# Patient Record
Sex: Female | Born: 1970 | Race: White | Hispanic: No | Marital: Married | Smoking: Never smoker
Health system: Southern US, Community
[De-identification: ages and names within clinical notes are randomized; demographics above are authoritative.]

## PROBLEM LIST (undated history)

## (undated) ENCOUNTER — Emergency Department (HOSPITAL_COMMUNITY)

## (undated) DIAGNOSIS — K760 Fatty (change of) liver, not elsewhere classified: Secondary | ICD-10-CM

## (undated) DIAGNOSIS — R51 Headache: Secondary | ICD-10-CM

## (undated) DIAGNOSIS — K838 Other specified diseases of biliary tract: Secondary | ICD-10-CM

## (undated) DIAGNOSIS — R112 Nausea with vomiting, unspecified: Secondary | ICD-10-CM

## (undated) DIAGNOSIS — K9189 Other postprocedural complications and disorders of digestive system: Secondary | ICD-10-CM

## (undated) DIAGNOSIS — Z9889 Other specified postprocedural states: Secondary | ICD-10-CM

## (undated) DIAGNOSIS — T7840XA Allergy, unspecified, initial encounter: Secondary | ICD-10-CM

## (undated) DIAGNOSIS — A419 Sepsis, unspecified organism: Secondary | ICD-10-CM

## (undated) DIAGNOSIS — F419 Anxiety disorder, unspecified: Secondary | ICD-10-CM

## (undated) DIAGNOSIS — T148XXA Other injury of unspecified body region, initial encounter: Secondary | ICD-10-CM

## (undated) DIAGNOSIS — E119 Type 2 diabetes mellitus without complications: Secondary | ICD-10-CM

## (undated) HISTORY — DX: Fatty (change of) liver, not elsewhere classified: K76.0

## (undated) HISTORY — DX: Allergy, unspecified, initial encounter: T78.40XA

## (undated) HISTORY — DX: Anxiety disorder, unspecified: F41.9

## (undated) SURGERY — ERCP, WITH INTERVENTION IF INDICATED
Anesthesia: Monitor Anesthesia Care

---

## 2006-08-15 ENCOUNTER — Emergency Department (HOSPITAL_COMMUNITY): Admission: EM | Admit: 2006-08-15 | Discharge: 2006-08-15 | Payer: Self-pay | Admitting: Emergency Medicine

## 2007-02-27 ENCOUNTER — Emergency Department (HOSPITAL_COMMUNITY): Admission: EM | Admit: 2007-02-27 | Discharge: 2007-02-27 | Payer: Self-pay | Admitting: Emergency Medicine

## 2007-05-28 ENCOUNTER — Emergency Department (HOSPITAL_COMMUNITY): Admission: EM | Admit: 2007-05-28 | Discharge: 2007-05-28 | Payer: Self-pay | Admitting: Emergency Medicine

## 2008-02-07 ENCOUNTER — Emergency Department (HOSPITAL_COMMUNITY): Admission: EM | Admit: 2008-02-07 | Discharge: 2008-02-07 | Payer: Self-pay | Admitting: Emergency Medicine

## 2008-02-08 ENCOUNTER — Encounter: Admission: RE | Admit: 2008-02-08 | Discharge: 2008-02-08 | Payer: Self-pay | Admitting: Family Medicine

## 2008-02-21 ENCOUNTER — Encounter: Admission: RE | Admit: 2008-02-21 | Discharge: 2008-02-21 | Payer: Self-pay | Admitting: Family Medicine

## 2009-06-30 ENCOUNTER — Observation Stay (HOSPITAL_COMMUNITY): Admission: EM | Admit: 2009-06-30 | Discharge: 2009-07-01 | Payer: Self-pay | Admitting: Emergency Medicine

## 2009-08-01 HISTORY — PX: APPENDECTOMY: SHX54

## 2010-01-13 ENCOUNTER — Emergency Department (HOSPITAL_BASED_OUTPATIENT_CLINIC_OR_DEPARTMENT_OTHER): Admission: EM | Admit: 2010-01-13 | Discharge: 2010-01-14 | Payer: Self-pay | Admitting: Emergency Medicine

## 2010-01-14 ENCOUNTER — Ambulatory Visit: Payer: Self-pay | Admitting: Diagnostic Radiology

## 2010-08-12 ENCOUNTER — Ambulatory Visit: Admit: 2010-08-12 | Payer: Self-pay | Admitting: Family Medicine

## 2010-11-03 LAB — CBC
MCHC: 34.8 g/dL (ref 30.0–36.0)
MCV: 90.4 fL (ref 78.0–100.0)
Platelets: 294 10*3/uL (ref 150–400)
RDW: 14.1 % (ref 11.5–15.5)
WBC: 14.7 10*3/uL — ABNORMAL HIGH (ref 4.0–10.5)

## 2010-11-03 LAB — DIFFERENTIAL
Basophils Absolute: 0.1 10*3/uL (ref 0.0–0.1)
Eosinophils Relative: 2 % (ref 0–5)
Lymphs Abs: 1.8 10*3/uL (ref 0.7–4.0)
Monocytes Relative: 9 % (ref 3–12)
Neutro Abs: 11.4 10*3/uL — ABNORMAL HIGH (ref 1.7–7.7)
Neutrophils Relative %: 77 % (ref 43–77)

## 2010-11-03 LAB — COMPREHENSIVE METABOLIC PANEL
Albumin: 3.7 g/dL (ref 3.5–5.2)
Alkaline Phosphatase: 61 U/L (ref 39–117)
CO2: 25 mEq/L (ref 19–32)
Creatinine, Ser: 0.89 mg/dL (ref 0.4–1.2)
GFR calc Af Amer: >60 mL/min (ref >60)
Potassium: 3.8 mEq/L (ref 3.5–5.1)
Total Protein: 6.6 g/dL (ref 6.0–8.3)

## 2010-11-03 LAB — URINALYSIS, ROUTINE W REFLEX MICROSCOPIC
Bilirubin Urine: NEGATIVE
Nitrite: NEGATIVE
Specific Gravity, Urine: 1.02 (ref 1.005–1.030)

## 2010-11-03 LAB — POCT PREGNANCY, URINE: Preg Test, Ur: NEGATIVE

## 2011-02-17 ENCOUNTER — Encounter: Payer: Self-pay | Admitting: Family Medicine

## 2011-02-17 ENCOUNTER — Telehealth: Payer: Self-pay | Admitting: Family Medicine

## 2011-02-17 ENCOUNTER — Ambulatory Visit (INDEPENDENT_AMBULATORY_CARE_PROVIDER_SITE_OTHER): Payer: PRIVATE HEALTH INSURANCE | Admitting: Family Medicine

## 2011-02-17 ENCOUNTER — Ambulatory Visit: Payer: Self-pay | Admitting: Family Medicine

## 2011-02-17 VITALS — BP 120/80 | HR 80 | Wt 217.0 lb

## 2011-02-17 DIAGNOSIS — M940 Chondrocostal junction syndrome [Tietze]: Secondary | ICD-10-CM

## 2011-02-17 DIAGNOSIS — R109 Unspecified abdominal pain: Secondary | ICD-10-CM

## 2011-02-17 DIAGNOSIS — N920 Excessive and frequent menstruation with regular cycle: Secondary | ICD-10-CM

## 2011-02-17 DIAGNOSIS — M25569 Pain in unspecified knee: Secondary | ICD-10-CM

## 2011-02-17 DIAGNOSIS — R51 Headache: Secondary | ICD-10-CM

## 2011-02-17 DIAGNOSIS — M25562 Pain in left knee: Secondary | ICD-10-CM

## 2011-02-17 LAB — COMPREHENSIVE METABOLIC PANEL
Alkaline Phosphatase: 64 U/L (ref 39–117)
BUN: 10 mg/dL (ref 6–23)
Chloride: 104 mEq/L (ref 96–112)
Creat: 0.91 mg/dL (ref 0.50–1.10)
Sodium: 137 mEq/L (ref 135–145)
Total Bilirubin: 0.6 mg/dL (ref 0.3–1.2)
Total Protein: 7.6 g/dL (ref 6.0–8.3)

## 2011-02-17 LAB — CBC WITH DIFFERENTIAL/PLATELET
Basophils Relative: 0 % (ref 0–1)
Eosinophils Absolute: 0.2 10*3/uL (ref 0.0–0.7)
Lymphocytes Relative: 35 % (ref 12–46)
MCH: 30.3 pg (ref 26.0–34.0)
Monocytes Absolute: 0.7 10*3/uL (ref 0.1–1.0)
Neutro Abs: 3.7 10*3/uL (ref 1.7–7.7)
Neutrophils Relative %: 53 % (ref 43–77)
Platelets: 372 10*3/uL (ref 150–400)
RBC: 4.49 MIL/uL (ref 3.87–5.11)
WBC: 7 10*3/uL (ref 4.0–10.5)

## 2011-02-17 NOTE — Progress Notes (Signed)
  Subjective:    Patient ID: Karen Roberts, female    DOB: 1971-02-08, 40 y.o.   MRN: [id number]  HPI For the last 2 years she has been having left knee pain but no popping, effusion, locking, grinding or giving way. The pain occurs with any activity. She did have an injury to this proximally 4 months ago and did have x-rays which were apparently negative. 4 Will over a year she has had abdominal discomfort with eating or drinking anything including water. She will occasionally get nausea but no vomiting or diarrhea. She has not tried any OTC meds. Her weight has been increasing She also has a several month history of left chest aching. Motion makes it worse , breathing has no effect. She has a several month history of headaches occur twice per week they usually start in the frontal area radiating posteriorly. She has tried Advil and Aleve on occasion for this. No blurred vision, double vision, sneezing, itchy watery eye. The headache is usually worse as the day progresses.  Review of Systems     Objective:   Physical Exam alert and in no distress. Tympanic membranes and canals are normal. Throat is clear. Tonsils are normal. Neck is supple without adenopathy or thyromegaly. Cardiac exam shows a regular sinus rhythm without murmurs or gallops. Lungs are clear to auscultation. Abdominal exam shows normal bowel sounds with some midepigastric tenderness. She is also tender over the left third costochondral junction. Exam of the left knee shows tenderness to the lateral joint line with McMurray's testing causing pain. No effusion noted. Negative anterior drawer.        Assessment & Plan:  Chest wall pain. Abdominal pain. Knee pain. Headache. I will order an MRI. Also recommend Tylenol for the other aches and pains. Sample of Dexilant given. She is to return here in one week for recheck and keep track of some of her symptoms. Routine blood screening.

## 2011-02-17 NOTE — Patient Instructions (Signed)
Keep track of your headaches in terms of when where and why. Also be aware if there any foods that tend to make this worse. Use 2 Tylenol 4 times per day regularly. I will see you again in one week

## 2011-02-17 NOTE — Telephone Encounter (Signed)
ERROR-LM

## 2011-02-19 ENCOUNTER — Ambulatory Visit
Admission: RE | Admit: 2011-02-19 | Discharge: 2011-02-19 | Disposition: A | Discharge status: Home / Self Care | Payer: PRIVATE HEALTH INSURANCE | Source: Ambulatory Visit | Attending: Family Medicine | Admitting: Family Medicine

## 2011-02-19 DIAGNOSIS — M25562 Pain in left knee: Secondary | ICD-10-CM

## 2011-02-21 ENCOUNTER — Telehealth: Payer: Self-pay

## 2011-02-21 NOTE — Telephone Encounter (Signed)
Pt advised blood work looked good and she has an appt Thursday to discuss mri

## 2011-02-24 ENCOUNTER — Encounter: Payer: Self-pay | Admitting: Family Medicine

## 2011-02-24 ENCOUNTER — Ambulatory Visit (INDEPENDENT_AMBULATORY_CARE_PROVIDER_SITE_OTHER): Payer: PRIVATE HEALTH INSURANCE | Admitting: Family Medicine

## 2011-02-24 VITALS — BP 110/78 | HR 83 | Wt 218.0 lb

## 2011-02-24 DIAGNOSIS — N644 Mastodynia: Secondary | ICD-10-CM

## 2011-02-24 DIAGNOSIS — R109 Unspecified abdominal pain: Secondary | ICD-10-CM

## 2011-02-24 DIAGNOSIS — M25569 Pain in unspecified knee: Secondary | ICD-10-CM

## 2011-02-24 DIAGNOSIS — G44209 Tension-type headache, unspecified, not intractable: Secondary | ICD-10-CM

## 2011-02-24 DIAGNOSIS — M25562 Pain in left knee: Secondary | ICD-10-CM

## 2011-02-24 DIAGNOSIS — K760 Fatty (change of) liver, not elsewhere classified: Secondary | ICD-10-CM

## 2011-02-24 HISTORY — DX: Fatty (change of) liver, not elsewhere classified: K76.0

## 2011-02-24 NOTE — Patient Instructions (Signed)
If this doesn't take her pain away, only for referral to orthopedics

## 2011-02-24 NOTE — Progress Notes (Signed)
  Subjective:    Patient ID: Karen Roberts, female    DOB: 12-Apr-1971, 40 y.o.   MRN: [id number]  HPI She is here for evaluation of multiple concerns. She had a recent MRI which did show some minor changes including some meniscal damage. She continues to complain of headaches that do tend to get worse when she is stressed and also if she is exposed to excessive heat. She did stop ibuprofen and is getting some relief with Tylenol. She continues to have difficulty with abdominal bloating from eating or drinking anything. She now states he has a previous history of difficulty with gallbladder disease and was told to have surgery however she did not have it done. She continues to complain of left-sided breast pain for the last several months. She has a previous history of mastitis. She's had no discharge and cannot relate this to her menses.   Review of Systems Negative except as above    Objective:   Physical Exam Breast exam deferred. Alert and in no distress.       Assessment & Plan:  Probable tension headache. Meniscal damage. Abdominal bloating probable gallbladder disease. Left breast pain. I discussed tension headache care with her. Strongly encouraged her to look at what is stressing her and address it differently. She will keep in touch with me on this. Set up for abdominal ultrasound. Mammogram ordered. Discussed treatment of her knee pain including surgical versus conservative care with injection. We have decided to inject this area. 40 mg of Kenalog and 3 cc of Xylocaine was injected into the joint space without difficulty. If she continues to have difficulty, orthopedic referral will be made.

## 2011-02-25 ENCOUNTER — Ambulatory Visit
Admission: RE | Admit: 2011-02-25 | Discharge: 2011-02-25 | Disposition: A | Discharge status: Home / Self Care | Payer: PRIVATE HEALTH INSURANCE | Source: Ambulatory Visit | Attending: Family Medicine | Admitting: Family Medicine

## 2011-02-25 DIAGNOSIS — R109 Unspecified abdominal pain: Secondary | ICD-10-CM

## 2011-02-25 NOTE — Progress Notes (Signed)
Pt appt for Aug. 7th @ 2:30 at [facility]

## 2011-03-08 ENCOUNTER — Encounter (INDEPENDENT_AMBULATORY_CARE_PROVIDER_SITE_OTHER): Payer: Self-pay | Admitting: Surgery

## 2011-03-08 ENCOUNTER — Ambulatory Visit (INDEPENDENT_AMBULATORY_CARE_PROVIDER_SITE_OTHER): Payer: PRIVATE HEALTH INSURANCE | Admitting: Surgery

## 2011-03-10 ENCOUNTER — Ambulatory Visit (INDEPENDENT_AMBULATORY_CARE_PROVIDER_SITE_OTHER): Payer: PRIVATE HEALTH INSURANCE | Admitting: Surgery

## 2011-03-10 ENCOUNTER — Encounter (INDEPENDENT_AMBULATORY_CARE_PROVIDER_SITE_OTHER): Payer: Self-pay | Admitting: Surgery

## 2011-03-10 VITALS — BP 110/72 | HR 78 | Ht 66.0 in | Wt 217.0 lb

## 2011-03-10 DIAGNOSIS — K8010 Calculus of gallbladder with chronic cholecystitis without obstruction: Secondary | ICD-10-CM

## 2011-03-10 NOTE — Progress Notes (Signed)
Karen Roberts is a 40 y.o. female.    Chief Complaint  Patient presents with  . Other    Eval for gallstones    HPI HPI This patient is referred for evaluation of abdominal bloating and gallstones. The patient states that for several years she has been unable to be certain types of meat because of abdominal pain and nausea. Over the last several months her symptoms have worsened to the point where every time she has something to eat or drink her upper abdomen becomes quite distended and bloated. She was evaluated by Dr. Susann Givens who performed lab work and an ultrasound. She has a normal CBC and liver function tests. Her ultrasound showed multiple gallstones and a positive sonographic Murphy sign. There is no sign of common bile duct dilatation. She comes in today for surgical evaluation  Past Medical History  Diagnosis Date  . Allergy   . Anxiety     Past Surgical History  Procedure Date  . Appendectomy   . Cesarean section 2000    History reviewed. No pertinent family history.  Social History History  Substance Use Topics  . Smoking status: Never Smoker   . Smokeless tobacco: Never Used  . Alcohol Use: Yes    Allergies  Allergen Reactions  . Ciprocin-Fluocin-Procin (Fluocinolone Acetonide)     No current outpatient prescriptions on file.    Review of Systems ROS Review of systems positive for significant weight gain over 50 pounds in the last year. She also has left knee pain from a torn ligament. Otherwise her review of systems is negative. Physical Exam Physical Exam   Blood pressure 110/72, pulse 78, height 5\' 6"  (1.676 m), weight 217 lb (98.431 kg), last menstrual period 02/10/2011. WDWN in NAD HEENT:  EOMI, sclera anicteric Neck:  No masses, no thyromegaly Lungs:  CTA bilaterally; normal respiratory effort CV:  Regular rate and rhythm; no murmurs Abd:  +bowel sounds, soft, moderately tender in the RUQ/ epigastrium, no masses Ext:  Well-perfused; no  edema Skin:  Warm, dry; no sign of jaundice  Assessment/Plan 1.  Chronic calculus cholecystitis  Plan:  Laparoscopic cholecystectomy with intraoperative cholangiogram.  I discussed the procedure in detail.  The patient was given Agricultural engineer.  We discussed the risks and benefits of a laparoscopic cholecystectomy including, but not limited to bleeding, infection, injury to surrounding structures such as the intestine or liver, bile leak, retained gallstones, need to convert to an open procedure, prolonged diarrhea, blood clots such as  DVT, common bile duct injury, anesthesia risks, and possible need for additional procedures.  We discussed the typical post-operative recovery course.   Nakhi Choi K. 03/10/2011, 10:01 AM

## 2011-03-10 NOTE — Patient Instructions (Signed)
Schedule laparoscopic cholecystectomy today

## 2011-04-28 LAB — POCT URINALYSIS DIP (DEVICE)
Bilirubin Urine: NEGATIVE
Glucose, UA: NEGATIVE
Ketones, ur: NEGATIVE
Specific Gravity, Urine: 1.02
Urobilinogen, UA: 0.2
pH: 5

## 2011-04-28 LAB — POCT PREGNANCY, URINE
Operator id: 239701
Preg Test, Ur: NEGATIVE

## 2011-05-16 LAB — URINALYSIS, ROUTINE W REFLEX MICROSCOPIC
Bilirubin Urine: NEGATIVE
Ketones, ur: NEGATIVE
Leukocytes, UA: NEGATIVE
Nitrite: NEGATIVE
Protein, ur: NEGATIVE
Urobilinogen, UA: 0.2
pH: 8.5 — ABNORMAL HIGH

## 2011-05-16 LAB — POCT PREGNANCY, URINE: Preg Test, Ur: NEGATIVE

## 2011-05-16 LAB — URINE MICROSCOPIC-ADD ON

## 2011-05-16 LAB — RAPID STREP SCREEN (MED CTR MEBANE ONLY): Streptococcus, Group A Screen (Direct): NEGATIVE

## 2011-09-01 ENCOUNTER — Ambulatory Visit (INDEPENDENT_AMBULATORY_CARE_PROVIDER_SITE_OTHER): Payer: Self-pay | Admitting: Surgery

## 2011-09-01 ENCOUNTER — Encounter (INDEPENDENT_AMBULATORY_CARE_PROVIDER_SITE_OTHER): Payer: Self-pay | Admitting: Surgery

## 2011-09-01 VITALS — BP 122/78 | HR 74 | Temp 98.8°F | Ht 67.0 in | Wt 212.8 lb

## 2011-09-01 DIAGNOSIS — K8010 Calculus of gallbladder with chronic cholecystitis without obstruction: Secondary | ICD-10-CM

## 2011-09-01 NOTE — Progress Notes (Signed)
Karen Roberts is a 41 y.o. female.    Chief Complaint  Patient presents with  . Other    Eval for gallstones    HPI HPI This patient is referred for evaluation of abdominal bloating and gallstones. The patient states that for several years she has been unable to be certain types of meat because of abdominal pain and nausea. Over the last several months her symptoms have worsened to the point where every time she has something to eat or drink her upper abdomen becomes quite distended and bloated. She was evaluated by Dr. Susann Givens who performed lab work and an ultrasound. She has a normal CBC and liver function tests. Her ultrasound showed multiple gallstones and a positive sonographic Murphy sign. There is no sign of common bile duct dilatation. I initially evaluated her last August.  She was unable to schedule surgery, but comes in today to plan her surgery.  Her symptoms persist - mostly nausea and bloating.  Past Medical History  Diagnosis Date  . Allergy   . Anxiety     Past Surgical History  Procedure Date  . Appendectomy   . Cesarean section 2000    History reviewed. No pertinent family history.  Social History History  Substance Use Topics  . Smoking status: Never Smoker   . Smokeless tobacco: Never Used  . Alcohol Use: Yes    Allergies  Allergen Reactions  . Ciprocin-Fluocin-Procin (Fluocinolone Acetonide)     No current outpatient prescriptions on file.    Review of Systems ROS Review of systems positive for significant weight gain over 50 pounds in the last year. She also has left knee pain from a torn ligament. Otherwise her review of systems is negative. Physical Exam Physical Exam   Filed Vitals:   09/01/11 0937  BP: 122/78  Pulse: 74  Temp: 98.8 F (37.1 C)   . WDWN in NAD HEENT:  EOMI, sclera anicteric Neck:  No masses, no thyromegaly Lungs:  CTA bilaterally; normal respiratory effort CV:  Regular rate and rhythm; no murmurs Abd:  +bowel  sounds, soft, moderately tender in the RUQ/ epigastrium, no masses Ext:  Well-perfused; no edema Skin:  Warm, dry; no sign of jaundice  Assessment/Plan 1.  Chronic calculus cholecystitis  Plan:  Laparoscopic cholecystectomy with intraoperative cholangiogram.  I discussed the procedure in detail.  The patient was given Agricultural engineer.  We discussed the risks and benefits of a laparoscopic cholecystectomy including, but not limited to bleeding, infection, injury to surrounding structures such as the intestine or liver, bile leak, retained gallstones, need to convert to an open procedure, prolonged diarrhea, blood clots such as  DVT, common bile duct injury, anesthesia risks, and possible need for additional procedures.  We discussed the typical post-operative recovery course.   Wilmon Arms. Corliss Skains, MD, [facility]  09/01/2011 10:16 AM

## 2011-09-28 ENCOUNTER — Telehealth: Payer: Self-pay | Admitting: Internal Medicine

## 2011-09-28 NOTE — Telephone Encounter (Signed)
Have her set up an appointment 

## 2011-09-29 NOTE — Telephone Encounter (Signed)
Left message for pt to make appt to discus this issue

## 2011-10-06 ENCOUNTER — Institutional Professional Consult (permissible substitution): Payer: Self-pay | Admitting: Family Medicine

## 2011-11-25 ENCOUNTER — Encounter (HOSPITAL_COMMUNITY): Payer: Self-pay | Admitting: Respiratory Therapy

## 2011-11-28 ENCOUNTER — Encounter (HOSPITAL_COMMUNITY): Payer: Self-pay

## 2011-11-28 ENCOUNTER — Encounter (HOSPITAL_COMMUNITY)
Admission: RE | Admit: 2011-11-28 | Discharge: 2011-11-28 | Disposition: A | Discharge status: Home / Self Care | Payer: 59 | Source: Ambulatory Visit | Attending: Surgery | Admitting: Surgery

## 2011-11-28 HISTORY — DX: Other injury of unspecified body region, initial encounter: T14.8XXA

## 2011-11-28 HISTORY — DX: Other specified postprocedural states: R11.2

## 2011-11-28 HISTORY — DX: Nausea with vomiting, unspecified: Z98.890

## 2011-11-28 LAB — SURGICAL PCR SCREEN: MRSA, PCR: NEGATIVE

## 2011-11-28 LAB — CBC
MCV: 89.8 fL (ref 78.0–100.0)
Platelets: 355 10*3/uL (ref 150–400)
RBC: 4.49 MIL/uL (ref 3.87–5.11)
WBC: 8.1 10*3/uL (ref 4.0–10.5)

## 2011-11-28 MED ORDER — CEFAZOLIN SODIUM-DEXTROSE 2-3 GM-% IV SOLR
2.0000 g | INTRAVENOUS | Status: AC
  Administered 2011-11-29: 2 g via INTRAVENOUS
  Filled 2011-11-28: qty 50

## 2011-11-28 NOTE — Pre-Procedure Instructions (Addendum)
20 Karen Roberts  11/28/2011   Your procedure is scheduled on:   Tuesday, April 30th.  Report to [facility] at 7:30 AM.  Call this number if you have problems the morning of surgery: [phone]   Remember:   Do not eat food:After Midnight.  May have clear liquids: up to 4 Hours before arrival. (3:30am)  Clear liquids include soda, tea, black coffee, apple or grape juice, broth.  Take these medicines the morning of surgery with A SIP OF WATER: None     Discontinue any Aspirin Products, NSAIDS and Herbal medications.   Do not wear jewelry, make-up or nail polish.  Do not wear lotions, powders, or perfumes. You may wear deodorant.  Do not shave 48 hours prior to surgery.  Do not bring valuables to the hospital.  Contacts, dentures or bridgework may not be worn into surgery.  Leave suitcase in the car. After surgery it may be brought to your room.  For patients admitted to the hospital, checkout time is 11:00 AM the day of discharge.   Patients discharged the day of surgery will not be allowed to drive home.  Name and phone number of your driver: --  Special Instructions: CHG Shower Use Special Wash: 1/2 bottle night before surgery and 1/2 bottle morning of surgery.   Please read over the following fact sheets that you were given: Pain Booklet, Coughing and Deep Breathing, MRSA Information and Surgical Site Infection Prevention

## 2011-11-29 ENCOUNTER — Encounter (HOSPITAL_COMMUNITY)
Admission: RE | Disposition: A | Discharge status: Home / Self Care | Payer: Self-pay | Source: Ambulatory Visit | Attending: Surgery

## 2011-11-29 ENCOUNTER — Ambulatory Visit (HOSPITAL_COMMUNITY): Payer: 59 | Admitting: Anesthesiology

## 2011-11-29 ENCOUNTER — Encounter (HOSPITAL_COMMUNITY): Payer: Self-pay | Admitting: General Practice

## 2011-11-29 ENCOUNTER — Encounter (HOSPITAL_COMMUNITY): Payer: Self-pay

## 2011-11-29 ENCOUNTER — Ambulatory Visit (HOSPITAL_COMMUNITY)
Admission: RE | Admit: 2011-11-29 | Discharge: 2011-12-01 | Disposition: A | Discharge status: Home / Self Care | Payer: 59 | Source: Ambulatory Visit | Attending: Surgery | Admitting: Surgery

## 2011-11-29 ENCOUNTER — Ambulatory Visit (HOSPITAL_COMMUNITY): Payer: 59

## 2011-11-29 ENCOUNTER — Encounter (HOSPITAL_COMMUNITY): Payer: Self-pay | Admitting: Anesthesiology

## 2011-11-29 DIAGNOSIS — Z01812 Encounter for preprocedural laboratory examination: Secondary | ICD-10-CM | POA: Insufficient documentation

## 2011-11-29 DIAGNOSIS — G8918 Other acute postprocedural pain: Secondary | ICD-10-CM | POA: Insufficient documentation

## 2011-11-29 DIAGNOSIS — Y9210 Unspecified residential institution as the place of occurrence of the external cause: Secondary | ICD-10-CM | POA: Insufficient documentation

## 2011-11-29 DIAGNOSIS — Z01811 Encounter for preprocedural respiratory examination: Secondary | ICD-10-CM | POA: Insufficient documentation

## 2011-11-29 DIAGNOSIS — K8010 Calculus of gallbladder with chronic cholecystitis without obstruction: Secondary | ICD-10-CM

## 2011-11-29 DIAGNOSIS — Y836 Removal of other organ (partial) (total) as the cause of abnormal reaction of the patient, or of later complication, without mention of misadventure at the time of the procedure: Secondary | ICD-10-CM | POA: Insufficient documentation

## 2011-11-29 HISTORY — PX: CHOLECYSTECTOMY: SHX55

## 2011-11-29 HISTORY — DX: Headache: R51

## 2011-11-29 SURGERY — LAPAROSCOPIC CHOLECYSTECTOMY WITH INTRAOPERATIVE CHOLANGIOGRAM
Anesthesia: General | Site: Abdomen | Wound class: Contaminated

## 2011-11-29 MED ORDER — 0.9 % SODIUM CHLORIDE (POUR BTL) OPTIME
TOPICAL | Status: DC | PRN
  Administered 2011-11-29: 1000 mL

## 2011-11-29 MED ORDER — PROMETHAZINE HCL 25 MG/ML IJ SOLN
INTRAMUSCULAR | Status: AC
  Filled 2011-11-29: qty 1

## 2011-11-29 MED ORDER — MORPHINE SULFATE 2 MG/ML IJ SOLN
INTRAMUSCULAR | Status: AC
  Administered 2011-11-29: 2 mg via INTRAVENOUS
  Filled 2011-11-29: qty 1

## 2011-11-29 MED ORDER — OXYCODONE-ACETAMINOPHEN 5-325 MG PO TABS: 1.0000 | ORAL_TABLET | ORAL | Status: DC | PRN

## 2011-11-29 MED ORDER — SODIUM CHLORIDE 0.9 % IV SOLN
INTRAVENOUS | Status: DC | PRN
  Administered 2011-11-29: 10:00:00

## 2011-11-29 MED ORDER — KETOROLAC TROMETHAMINE 30 MG/ML IJ SOLN
30.0000 mg | Freq: Once | INTRAMUSCULAR | Status: AC
  Administered 2011-11-29: 30 mg via INTRAVENOUS

## 2011-11-29 MED ORDER — HYDROMORPHONE HCL PF 1 MG/ML IJ SOLN
INTRAMUSCULAR | Status: AC
  Filled 2011-11-29: qty 1

## 2011-11-29 MED ORDER — EPHEDRINE SULFATE 50 MG/ML IJ SOLN
INTRAMUSCULAR | Status: DC | PRN
  Administered 2011-11-29: 10 mg via INTRAVENOUS

## 2011-11-29 MED ORDER — KETOROLAC TROMETHAMINE 30 MG/ML IJ SOLN
INTRAMUSCULAR | Status: AC
  Filled 2011-11-29: qty 1

## 2011-11-29 MED ORDER — HYDROMORPHONE HCL PF 1 MG/ML IJ SOLN: 1.0000 mg | INTRAMUSCULAR | Status: DC | PRN

## 2011-11-29 MED ORDER — ONDANSETRON HCL 4 MG/2ML IJ SOLN
INTRAMUSCULAR | Status: AC
  Filled 2011-11-29: qty 2

## 2011-11-29 MED ORDER — KETOROLAC TROMETHAMINE 30 MG/ML IJ SOLN
30.0000 mg | Freq: Four times a day (QID) | INTRAMUSCULAR | Status: DC
  Administered 2011-11-29 – 2011-12-01 (×7): 30 mg via INTRAVENOUS
  Filled 2011-11-29 (×9): qty 1

## 2011-11-29 MED ORDER — LACTATED RINGERS IV SOLN: INTRAVENOUS | Status: DC

## 2011-11-29 MED ORDER — PROPOFOL 10 MG/ML IV EMUL
INTRAVENOUS | Status: DC | PRN
  Administered 2011-11-29: 200 mg via INTRAVENOUS

## 2011-11-29 MED ORDER — ONDANSETRON HCL 4 MG/2ML IJ SOLN
INTRAMUSCULAR | Status: DC | PRN
  Administered 2011-11-29: 4 mg via INTRAVENOUS

## 2011-11-29 MED ORDER — NEOSTIGMINE METHYLSULFATE 1 MG/ML IJ SOLN
INTRAMUSCULAR | Status: DC | PRN
  Administered 2011-11-29: 3 mg via INTRAVENOUS

## 2011-11-29 MED ORDER — ONDANSETRON HCL 4 MG PO TABS: 4.0000 mg | ORAL_TABLET | Freq: Four times a day (QID) | ORAL | Status: DC | PRN

## 2011-11-29 MED ORDER — ONDANSETRON HCL 4 MG/2ML IJ SOLN: 4.0000 mg | INTRAMUSCULAR | Status: DC | PRN

## 2011-11-29 MED ORDER — LACTATED RINGERS IV SOLN
INTRAVENOUS | Status: DC | PRN
  Administered 2011-11-29 (×2): via INTRAVENOUS

## 2011-11-29 MED ORDER — SODIUM CHLORIDE 0.9 % IV SOLN: INTRAVENOUS | Status: DC

## 2011-11-29 MED ORDER — GLYCOPYRROLATE 0.2 MG/ML IJ SOLN
INTRAMUSCULAR | Status: DC | PRN
  Administered 2011-11-29: 0.4 mg via INTRAVENOUS

## 2011-11-29 MED ORDER — MORPHINE SULFATE 2 MG/ML IJ SOLN
INTRAMUSCULAR | Status: AC
  Filled 2011-11-29: qty 1

## 2011-11-29 MED ORDER — ONDANSETRON HCL 4 MG/2ML IJ SOLN
4.0000 mg | Freq: Four times a day (QID) | INTRAMUSCULAR | Status: DC | PRN
  Administered 2011-12-01: 4 mg via INTRAVENOUS
  Filled 2011-11-29: qty 2

## 2011-11-29 MED ORDER — SODIUM CHLORIDE 0.9 % IR SOLN
Status: DC | PRN
  Administered 2011-11-29: 1000 mL

## 2011-11-29 MED ORDER — MORPHINE SULFATE 2 MG/ML IJ SOLN
2.0000 mg | INTRAMUSCULAR | Status: DC | PRN
  Administered 2011-11-29 – 2011-11-30 (×4): 2 mg via INTRAVENOUS
  Filled 2011-11-29 (×3): qty 1

## 2011-11-29 MED ORDER — PHENYLEPHRINE HCL 10 MG/ML IJ SOLN
INTRAMUSCULAR | Status: DC | PRN
  Administered 2011-11-29: 160 ug via INTRAVENOUS
  Administered 2011-11-29 (×2): 120 ug via INTRAVENOUS

## 2011-11-29 MED ORDER — HYDROMORPHONE HCL PF 1 MG/ML IJ SOLN
0.2500 mg | INTRAMUSCULAR | Status: DC | PRN
  Administered 2011-11-29 (×6): 0.5 mg via INTRAVENOUS

## 2011-11-29 MED ORDER — DEXAMETHASONE SODIUM PHOSPHATE 10 MG/ML IJ SOLN
INTRAMUSCULAR | Status: DC | PRN
  Administered 2011-11-29: 8 mg via INTRAVENOUS

## 2011-11-29 MED ORDER — PROMETHAZINE HCL 25 MG/ML IJ SOLN
6.2500 mg | INTRAMUSCULAR | Status: DC | PRN
  Administered 2011-11-29: 6.25 mg via INTRAVENOUS

## 2011-11-29 MED ORDER — SODIUM CHLORIDE 0.9 % IV SOLN
INTRAVENOUS | Status: DC
  Administered 2011-11-29 – 2011-11-30 (×3): via INTRAVENOUS

## 2011-11-29 MED ORDER — IBUPROFEN 600 MG PO TABS
600.0000 mg | ORAL_TABLET | Freq: Four times a day (QID) | ORAL | Status: DC | PRN
  Filled 2011-11-29: qty 1

## 2011-11-29 MED ORDER — FENTANYL CITRATE 0.05 MG/ML IJ SOLN
INTRAMUSCULAR | Status: DC | PRN
  Administered 2011-11-29 (×2): 50 ug via INTRAVENOUS
  Administered 2011-11-29: 150 ug via INTRAVENOUS

## 2011-11-29 MED ORDER — BUPIVACAINE-EPINEPHRINE 0.25% -1:200000 IJ SOLN
INTRAMUSCULAR | Status: DC | PRN
  Administered 2011-11-29: 14 mL

## 2011-11-29 MED ORDER — ONDANSETRON HCL 4 MG/2ML IJ SOLN
4.0000 mg | INTRAMUSCULAR | Status: DC | PRN
  Administered 2011-11-29: 4 mg via INTRAVENOUS

## 2011-11-29 MED ORDER — OXYCODONE-ACETAMINOPHEN 5-325 MG PO TABS
1.0000 | ORAL_TABLET | ORAL | Status: DC | PRN
  Administered 2011-11-30 – 2011-12-01 (×4): 2 via ORAL
  Filled 2011-11-29 (×5): qty 2

## 2011-11-29 MED ORDER — HYDROMORPHONE HCL PF 1 MG/ML IJ SOLN
1.0000 mg | INTRAMUSCULAR | Status: DC | PRN
  Administered 2011-11-30 (×2): 1 mg via INTRAVENOUS
  Filled 2011-11-29 (×2): qty 1

## 2011-11-29 MED ORDER — LIDOCAINE HCL (CARDIAC) 20 MG/ML IV SOLN
INTRAVENOUS | Status: DC | PRN
  Administered 2011-11-29: 50 mg via INTRAVENOUS

## 2011-11-29 MED ORDER — MIDAZOLAM HCL 5 MG/5ML IJ SOLN
INTRAMUSCULAR | Status: DC | PRN
  Administered 2011-11-29: 2 mg via INTRAVENOUS

## 2011-11-29 MED ORDER — ROCURONIUM BROMIDE 100 MG/10ML IV SOLN
INTRAVENOUS | Status: DC | PRN
  Administered 2011-11-29: 40 mg via INTRAVENOUS
  Administered 2011-11-29: 10 mg via INTRAVENOUS

## 2011-11-29 SURGICAL SUPPLY — 43 items
APL SKNCLS STERI-STRIP NONHPOA (GAUZE/BANDAGES/DRESSINGS) ×1
APPLIER CLIP ROT 10 11.4 M/L (STAPLE) ×2
APR CLP MED LRG 11.4X10 (STAPLE) ×1
BAG SPEC RTRVL LRG 6X4 10 (ENDOMECHANICALS) ×1
BENZOIN TINCTURE PRP APPL 2/3 (GAUZE/BANDAGES/DRESSINGS) ×2 IMPLANT
BLADE SURG ROTATE 9660 (MISCELLANEOUS) IMPLANT
CANISTER SUCTION 2500CC (MISCELLANEOUS) ×2 IMPLANT
CHLORAPREP W/TINT 26ML (MISCELLANEOUS) ×2 IMPLANT
CLIP APPLIE ROT 10 11.4 M/L (STAPLE) ×1 IMPLANT
CLOTH BEACON ORANGE TIMEOUT ST (SAFETY) ×2 IMPLANT
COVER MAYO STAND STRL (DRAPES) ×2 IMPLANT
COVER SURGICAL LIGHT HANDLE (MISCELLANEOUS) ×2 IMPLANT
DECANTER SPIKE VIAL GLASS SM (MISCELLANEOUS) IMPLANT
DRAPE C-ARM 42X72 X-RAY (DRAPES) ×2 IMPLANT
DRAPE UTILITY 15X26 W/TAPE STR (DRAPE) ×4 IMPLANT
DRSG TEGADERM 4X4.75 (GAUZE/BANDAGES/DRESSINGS) ×2 IMPLANT
ELECT REM PT RETURN 9FT ADLT (ELECTROSURGICAL) ×2
ELECTRODE REM PT RTRN 9FT ADLT (ELECTROSURGICAL) ×1 IMPLANT
FILTER SMOKE EVAC LAPAROSHD (FILTER) ×2 IMPLANT
GAUZE SPONGE 2X2 8PLY STRL LF (GAUZE/BANDAGES/DRESSINGS) ×1 IMPLANT
GLOVE BIO SURGEON STRL SZ7 (GLOVE) ×2 IMPLANT
GLOVE BIO SURGEON STRL SZ7.5 (GLOVE) ×2 IMPLANT
GLOVE BIOGEL PI IND STRL 7.5 (GLOVE) ×2 IMPLANT
GLOVE BIOGEL PI INDICATOR 7.5 (GLOVE) ×2
GOWN STRL NON-REIN LRG LVL3 (GOWN DISPOSABLE) ×6 IMPLANT
KIT BASIN OR (CUSTOM PROCEDURE TRAY) ×2 IMPLANT
KIT ROOM TURNOVER OR (KITS) ×2 IMPLANT
NS IRRIG 1000ML POUR BTL (IV SOLUTION) ×2 IMPLANT
PAD ARMBOARD 7.5X6 YLW CONV (MISCELLANEOUS) ×2 IMPLANT
POUCH SPECIMEN RETRIEVAL 10MM (ENDOMECHANICALS) ×2 IMPLANT
SCISSORS LAP 5X35 DISP (ENDOMECHANICALS) IMPLANT
SET CHOLANGIOGRAPH 5 50 .035 (SET/KITS/TRAYS/PACK) ×2 IMPLANT
SET IRRIG TUBING LAPAROSCOPIC (IRRIGATION / IRRIGATOR) ×2 IMPLANT
SLEEVE ENDOPATH XCEL 5M (ENDOMECHANICALS) ×2 IMPLANT
SPECIMEN JAR SMALL (MISCELLANEOUS) ×2 IMPLANT
SPONGE GAUZE 2X2 STER 10/PKG (GAUZE/BANDAGES/DRESSINGS) ×1
SUT MNCRL AB 4-0 PS2 18 (SUTURE) ×2 IMPLANT
TOWEL OR 17X24 6PK STRL BLUE (TOWEL DISPOSABLE) ×2 IMPLANT
TOWEL OR 17X26 10 PK STRL BLUE (TOWEL DISPOSABLE) ×2 IMPLANT
TRAY LAPAROSCOPIC (CUSTOM PROCEDURE TRAY) ×2 IMPLANT
TROCAR XCEL BLUNT TIP 100MML (ENDOMECHANICALS) ×2 IMPLANT
TROCAR XCEL NON-BLD 11X100MML (ENDOMECHANICALS) ×2 IMPLANT
TROCAR XCEL NON-BLD 5MMX100MML (ENDOMECHANICALS) ×2 IMPLANT

## 2011-11-29 NOTE — Progress Notes (Signed)
Received from PACU post cholecystecomy, dressing to lap sites intact, with old drainage noted on one site, alert and oriented, not in any distress, VSS, will continue to monitor.

## 2011-11-29 NOTE — Anesthesia Postprocedure Evaluation (Signed)
  Anesthesia Post-op Note  Patient: Karen Roberts  Procedure(s) Performed: Procedure(s) (LRB): LAPAROSCOPIC CHOLECYSTECTOMY WITH INTRAOPERATIVE CHOLANGIOGRAM (N/A)  Patient Location: PACU  Anesthesia Type: General  Level of Consciousness: awake, alert  and oriented  Airway and Oxygen Therapy: Patient Spontanous Breathing and Patient connected to nasal cannula oxygen  Post-op Pain: none  Post-op Assessment: Post-op Vital signs reviewed, Patient's Cardiovascular Status Stable, Respiratory Function Stable, Patent Airway, No signs of Nausea or vomiting and Pain level controlled  Post-op Vital Signs: Reviewed and stable  Complications: No apparent anesthesia complications

## 2011-11-29 NOTE — Discharge Instructions (Signed)
[facility] °LAPAROSCOPIC SURGERY: POST OP INSTRUCTIONS °Always review your discharge instruction sheet given to you by the facility where your surgery was performed. °IF YOU HAVE DISABILITY OR FAMILY LEAVE FORMS, YOU MUST BRING THEM TO THE OFFICE FOR PROCESSING.   °DO NOT GIVE THEM TO YOUR DOCTOR. ° °1. A prescription for pain medication will be given to you upon discharge.  Take your pain medication as prescribed, if needed.  If narcotic pain medicine is not needed, then you may take acetaminophen (Tylenol) or ibuprofen (Advil) as needed. °2. Take your usually prescribed medications unless otherwise directed. °3. If you need a refill on your pain medication, please contact your pharmacy.  They will contact our office to request authorization. Prescriptions will not be filled after 5pm or on week-ends. °4. You should follow a light diet the first few days after arrival home, such as soup and crackers, etc.  Be sure to include lots of fluids daily. °5. Most patients will experience some swelling and bruising in the area of the incisions.  Ice packs will help.  Swelling and bruising can take several days to resolve.  °6. It is common to experience some constipation if taking pain medication after surgery.  Increasing fluid intake and taking a stool softener (such as Colace) will usually help or prevent this problem from occurring.  A mild laxative (Milk of Magnesia or Miralax) should be taken according to package instructions if there are no bowel movements after 48 hours. °7. Unless discharge instructions indicate otherwise, you may remove your bandages 48 hours after surgery, and you may shower at that time.  You will have steri-strips (small skin tapes) in place directly over the incision.  These strips should be left on the skin for 7-10 days.  If your surgeon used skin glue on the incision, you may shower in 24 hours.  The glue will flake off over the next 2-3 weeks.  Any sutures or staples  will be removed at the office during your follow-up visit. °8. ACTIVITIES:  You may resume regular (light) daily activities beginning the next day--such as daily self-care, walking, climbing stairs--gradually increasing activities as tolerated.  You may have sexual intercourse when it is comfortable.  Refrain from any heavy lifting or straining until approved by your doctor. °a. You may drive when you are no longer taking prescription pain medication, you can comfortably wear a seatbelt, and you can safely maneuver your car and apply brakes. °b. RETURN TO WORK:   2-3 weeks °9. You should see your doctor in the office for a follow-up appointment approximately 2-3 weeks after your surgery.  Make sure that you call for this appointment within a day or two after you arrive home to insure a convenient appointment time. °10. OTHER INSTRUCTIONS: ________________________________________________________________________ °WHEN TO CALL YOUR DOCTOR: °1. Fever over 101.0 °2. Inability to urinate °3. Continued bleeding from incision. °4. Increased pain, redness, or drainage from the incision. °5. Increasing abdominal pain ° °The clinic staff is available to answer your questions during regular business hours.  Please don’t hesitate to call and ask to speak to one of the nurses for clinical concerns.  If you have a medical emergency, go to the nearest emergency room or call 911.  A surgeon from [facility] is always on call at the hospital. °[address], [address], [place], [place]  [address] ? [address] [address], [place], [place]   [address] °[phone] ? [phone] ? FAX [phone] °Web site:   [email or web address] ° °

## 2011-11-29 NOTE — H&P (Signed)
   Progress Notes     Lenola Lockner is a 41 y.o. female.      Chief Complaint   Patient presents with   .  Other       Eval for gallstones        HPI HPI This patient is referred for evaluation of abdominal bloating and gallstones. The patient states that for several years she has been unable to be certain types of meat because of abdominal pain and nausea. Over the last several months her symptoms have worsened to the point where every time she has something to eat or drink her upper abdomen becomes quite distended and bloated. She was evaluated by Dr. Susann Givens who performed lab work and an ultrasound. She has a normal CBC and liver function tests. Her ultrasound showed multiple gallstones and a positive sonographic Murphy sign. There is no sign of common bile duct dilatation. I initially evaluated her last August.  She was unable to schedule surgery, but comes in today to plan her surgery.  Her symptoms persist - mostly nausea and bloating.    Past Medical History   Diagnosis  Date   .  Allergy     .  Anxiety           Past Surgical History   Procedure  Date   .  Appendectomy     .  Cesarean section  2000        History reviewed. No pertinent family history.   Social History History   Substance Use Topics   .  Smoking status:  Never Smoker    .  Smokeless tobacco:  Never Used   .  Alcohol Use:  Yes         Allergies   Allergen  Reactions   .  Ciprocin-Fluocin-Procin (Fluocinolone Acetonide)           No current outpatient prescriptions on file.        Review of Systems ROS Review of systems positive for significant weight gain over 50 pounds in the last year. She also has left knee pain from a torn ligament. Otherwise her review of systems is negative. Physical Exam Physical Exam     Filed Vitals:     09/01/11 0937   BP:  122/78   Pulse:  74   Temp:  98.8 F (37.1 C)      . WDWN in NAD HEENT:  EOMI, sclera anicteric Neck:  No masses, no  thyromegaly Lungs:  CTA bilaterally; normal respiratory effort CV:  Regular rate and rhythm; no murmurs Abd:  +bowel sounds, soft, moderately tender in the RUQ/ epigastrium, no masses Ext:  Well-perfused; no edema Skin:  Warm, dry; no sign of jaundice   Assessment/Plan 1.  Chronic calculus cholecystitis   Plan:  Laparoscopic cholecystectomy with intraoperative cholangiogram.  I discussed the procedure in detail.  The patient was given Agricultural engineer.  We discussed the risks and benefits of a laparoscopic cholecystectomy including, but not limited to bleeding, infection, injury to surrounding structures such as the intestine or liver, bile leak, retained gallstones, need to convert to an open procedure, prolonged diarrhea, blood clots such as  DVT, common bile duct injury, anesthesia risks, and possible need for additional procedures.  We discussed the typical post-operative recovery course.    Wilmon Arms. Corliss Skains, MD, [facility]  11/29/2011 8:52 AM

## 2011-11-29 NOTE — Op Note (Signed)
Laparoscopic Cholecystectomy with IOC Procedure Note  Indications: This patient presents with symptomatic gallbladder disease and will undergo laparoscopic cholecystectomy.  Pre-operative Diagnosis: Calculus of gallbladder with other cholecystitis, without mention of obstruction  Post-operative Diagnosis: Same  Surgeon: Tierra Thoma K.   Assistants: none   Anesthesia: General endotracheal anesthesia  ASA Class: 2  Procedure Details  The patient was seen again in the Holding Room. The risks, benefits, complications, treatment options, and expected outcomes were discussed with the patient. The possibilities of reaction to medication, pulmonary aspiration, perforation of viscus, bleeding, recurrent infection, finding a normal gallbladder, the need for additional procedures, failure to diagnose a condition, the possible need to convert to an open procedure, and creating a complication requiring transfusion or operation were discussed with the patient. The likelihood of improving the patient's symptoms with return to their baseline status is good.  The patient and/or family concurred with the proposed plan, giving informed consent. The site of surgery properly noted. The patient was taken to Operating Room, identified as Karen Roberts and the procedure verified as Laparoscopic Cholecystectomy with Intraoperative Cholangiogram. A Time Out was held and the above information confirmed.  Prior to the induction of general anesthesia, antibiotic prophylaxis was administered. General endotracheal anesthesia was then administered and tolerated well. After the induction, the abdomen was prepped with Chloraprep and draped in the sterile fashion. The patient was positioned in the supine position.  Local anesthetic agent was injected into the skin near the umbilicus and an incision made. We dissected down to the abdominal fascia with blunt dissection.  The fascia was incised vertically and we entered the  peritoneal cavity bluntly.  A pursestring suture of 0-Vicryl was placed around the fascial opening.  The Hasson cannula was inserted and secured with the stay suture.  Pneumoperitoneum was then created with CO2 and tolerated well without any adverse changes in the patient's vital signs. An 11-mm port was placed in the subxiphoid position.  Two 5-mm ports were placed in the right upper quadrant. All skin incisions were infiltrated with a local anesthetic agent before making the incision and placing the trocars.   We positioned the patient in reverse Trendelenburg, tilted slightly to the patient's left.  The gallbladder was identified, the fundus grasped and retracted cephalad. Adhesions were lysed bluntly and with the electrocautery where indicated, taking care not to injure any adjacent organs or viscus. The infundibulum was grasped and retracted laterally, exposing the peritoneum overlying the triangle of Calot. This was then divided and exposed in a blunt fashion. A critical view of the cystic duct and cystic artery was obtained.  The cystic duct was clearly identified and bluntly dissected circumferentially.  The cystic duct was quite short The cystic duct was ligated with a clip distally.   An incision was made in the cystic duct and the [facility] cholangiogram catheter introduced. The catheter was secured using a clip. A cholangiogram was then obtained which showed good visualization of the distal and proximal biliary tree with no sign of filling defects or obstruction.  Contrast flowed easily into the duodenum. The catheter was then removed.   The cystic duct was then ligated with clips and divided. The cystic artery was identified, dissected free, ligated with clips and divided as well.   The gallbladder was dissected from the liver bed in retrograde fashion with the electrocautery. The gallbladder was removed and placed in an Endocatch sac. The liver bed was irrigated and inspected. Hemostasis was achieved  with the electrocautery. Copious irrigation  was utilized and was repeatedly aspirated until clear.  The gallbladder and Endocatch sac were then removed through the umbilical port site.  The pursestring suture was used to close the umbilical fascia.    We again inspected the right upper quadrant for hemostasis.  Pneumoperitoneum was released as we removed the trocars.  4-0 Monocryl was used to close the skin.   Benzoin, steri-strips, and clean dressings were applied. The patient was then extubated and brought to the recovery room in stable condition. Instrument, sponge, and needle counts were correct at closure and at the conclusion of the case.   Findings: Cholecystitis with Cholelithiasis  Estimated Blood Loss: Minimal         Drains: none         Specimens: Gallbladder           Complications: None; patient tolerated the procedure well.         Disposition: PACU - hemodynamically stable.         Condition: stable  Wilmon Arms. Corliss Skains, MD, [facility]  11/29/2011 10:37 AM

## 2011-11-29 NOTE — Interval H&P Note (Signed)
History and Physical Interval Note:  11/29/2011 8:53 AM  Karen Roberts  has presented today for surgery, with the diagnosis of chronic calculus choleelithiasis  The various methods of treatment have been discussed with the patient and family. After consideration of risks, benefits and other options for treatment, the patient has consented to  Procedure(s) (LRB): LAPAROSCOPIC CHOLECYSTECTOMY WITH INTRAOPERATIVE CHOLANGIOGRAM (N/A) as a surgical intervention .  The patients' history has been reviewed, patient examined, no change in status, stable for surgery.  I have reviewed the patients' chart and labs.  Questions were answered to the patient's satisfaction.     [name]

## 2011-11-29 NOTE — Anesthesia Procedure Notes (Signed)
Procedure Name: Intubation Date/Time: 11/29/2011 9:17 AM Performed by: Arlice Colt B Pre-anesthesia Checklist: Patient identified, Emergency Drugs available, Suction available, Patient being monitored and Timeout performed Patient Re-evaluated:Patient Re-evaluated prior to inductionOxygen Delivery Method: Circle system utilized Preoxygenation: Pre-oxygenation with 100% oxygen Intubation Type: IV induction Ventilation: Mask ventilation without difficulty Laryngoscope Size: Mac and 3 Grade View: Grade II Tube type: Oral Tube size: 7.5 mm Number of attempts: 1 Airway Equipment and Method: Stylet Placement Confirmation: ETT inserted through vocal cords under direct vision,  positive ETCO2 and breath sounds checked- equal and bilateral Secured at: 21 cm Tube secured with: Tape Dental Injury: Teeth and Oropharynx as per pre-operative assessment

## 2011-11-29 NOTE — Anesthesia Preprocedure Evaluation (Addendum)
Anesthesia Evaluation  Patient identified by MRN, date of birth, ID band Patient awake    Reviewed: Allergy & Precautions, H&P , NPO status , Patient's Chart, lab work & pertinent test results  History of Anesthesia Complications (+) PONV  Airway Mallampati: I TM Distance: >3 FB Neck ROM: full    Dental No notable dental hx. (+) Teeth Intact and Dental Advisory Given   Pulmonary neg pulmonary ROS,  breath sounds clear to auscultation  Pulmonary exam normal       Cardiovascular negative cardio ROS  Rhythm:Regular Rate:Normal     Neuro/Psych negative neurological ROS     GI/Hepatic negative GI ROS, Neg liver ROS,   Endo/Other  Morbid obesity  Renal/GU negative Renal ROS     Musculoskeletal   Abdominal (+) + obese,   Peds  Hematology   Anesthesia Other Findings   Reproductive/Obstetrics preg test NEG                          Anesthesia Physical Anesthesia Plan  ASA: II  Anesthesia Plan: General   Post-op Pain Management:    Induction: Intravenous  Airway Management Planned: Oral ETT  Additional Equipment:   Intra-op Plan:   Post-operative Plan:   Informed Consent: I have reviewed the patients History and Physical, chart, labs and discussed the procedure including the risks, benefits and alternatives for the proposed anesthesia with the patient or authorized representative who has indicated his/her understanding and acceptance.   Dental Advisory Given and Dental advisory given  Plan Discussed with: Anesthesiologist, Surgeon and CRNA  Anesthesia Plan Comments: (Plan routine monitors, GETA)       Anesthesia Quick Evaluation

## 2011-11-29 NOTE — Progress Notes (Signed)
Patient is experiencing periods of extreme anxiety, in which she states she cannot breathe and that she is having great pain.  Patient, when not being stimulated directly, sleeps.  Spoke with Dr. Corliss Skains about admitting her, because she states she cannot sit up and get in the wheelchair, because of her great pain, nausea and inability to breath (oxygen saturation - 96% on room air).  Also spoke with Dr. Jean Rosenthal to get further antiemetics.  Orders received both from Dr. Harlon Flor and Dr. Jean Rosenthal.

## 2011-11-29 NOTE — Transfer of Care (Signed)
Immediate Anesthesia Transfer of Care Note  Patient: Karen Roberts  Procedure(s) Performed: Procedure(s) (LRB): LAPAROSCOPIC CHOLECYSTECTOMY WITH INTRAOPERATIVE CHOLANGIOGRAM (N/A)  Patient Location: PACU  Anesthesia Type: General  Level of Consciousness: awake, alert , oriented and patient cooperative  Airway & Oxygen Therapy: Patient Spontanous Breathing and Patient connected to nasal cannula oxygen  Post-op Assessment: Report given to PACU RN, Post -op Vital signs reviewed and stable and Patient moving all extremities  Post vital signs: Reviewed and stable  Complications: No apparent anesthesia complications

## 2011-11-30 ENCOUNTER — Encounter (HOSPITAL_COMMUNITY): Payer: Self-pay | Admitting: Surgery

## 2011-11-30 LAB — CBC
Hemoglobin: 11.4 g/dL — ABNORMAL LOW (ref 12.0–15.0)
MCV: 87.9 fL (ref 78.0–100.0)
Platelets: 311 10*3/uL (ref 150–400)
RBC: 3.8 MIL/uL — ABNORMAL LOW (ref 3.87–5.11)
WBC: 15.5 10*3/uL — ABNORMAL HIGH (ref 4.0–10.5)

## 2011-11-30 LAB — COMPREHENSIVE METABOLIC PANEL
ALT: 118 U/L — ABNORMAL HIGH (ref 0–35)
AST: 91 U/L — ABNORMAL HIGH (ref 0–37)
CO2: 24 mEq/L (ref 19–32)
Chloride: 103 mEq/L (ref 96–112)
GFR calc Af Amer: >90 mL/min (ref >90)
GFR calc non Af Amer: >90 mL/min (ref >90)
Glucose, Bld: 101 mg/dL — ABNORMAL HIGH (ref 70–99)
Sodium: 135 mEq/L (ref 135–145)
Total Bilirubin: 1 mg/dL (ref 0.3–1.2)

## 2011-11-30 NOTE — Progress Notes (Signed)
Clinical Social Worker met with pt at bedside; CSW began assessment and pt suddenly complained of sharp pains.  RN to room to meet with pt.  CSW explained that CSW will return at a later time when pt more able to participate in assessment.    Angelia Mould, MSW, [place] [phone]

## 2011-11-30 NOTE — Progress Notes (Signed)
1 Day Post-Op  Subjective: Patient still complains of significant RUQ pain.  Using IV pain meds; minimal PO intake No nausea  Objective: Vital signs in last 24 hours: Temp:  [98.1 F (36.7 C)-98.8 F (37.1 C)] 98.5 F (36.9 C) (05/01 0530) Pulse Rate:  [70-112] 99  (05/01 0530) Resp:  [16-28] 18  (05/01 0530) BP: (86-125)/(45-78) 86/57 mmHg (05/01 0530) SpO2:  [89 %-100 %] 92 % (05/01 0530) Last BM Date: 11/28/11  Intake/Output from previous day: 04/30 0701 - 05/01 0700 In: 2281 [I.V.:2281] Out: -  Intake/Output this shift:    General appearance: alert, cooperative and no distress GI: soft, tender in RUQ along costal margin Dressings dry  Lab Results:   Basename 11/28/11 1036  WBC 8.1  HGB 13.5  HCT 40.3  PLT 355   BMET No results found for this basename: NA:2,K:2,CL:2,CO2:2,GLUCOSE:2,BUN:2,CREATININE:2,CALCIUM:2 in the last 72 hours PT/INR No results found for this basename: LABPROT:2,INR:2 in the last 72 hours ABG No results found for this basename: PHART:2,PCO2:2,PO2:2,HCO3:2 in the last 72 hours  Studies/Results: Dg Cholangiogram Operative  11/29/2011  *RADIOLOGY REPORT*  Clinical Data:   41 year old female undergoing laparoscopic cholecystectomy.  INTRAOPERATIVE CHOLANGIOGRAM  Technique:  Cholangiographic images from the C-arm fluoroscopic device were submitted for interpretation post-operatively.  Please see the procedural report for the amount of contrast and the fluoroscopy time utilized.  Comparison:  Abdominal ultrasound 02/25/2011 and earlier.  Fluoroscopy time of 0.1 minutes was utilized.  Findings:  Multiple intraoperative fluoroscopic images of the right upper quadrant.  Surgical clips at the level of the cystic duct which has been cannulated.  Contrast injection demonstrates nondilated intra and extrahepatic biliary system.  Prompt contrast transit to the duodenum.  No filling defect or extravasation.  IMPRESSION: Negative intraoperative cholangiogram.   Original Report Authenticated By: Harley Hallmark, M.D.    Anti-infectives: Anti-infectives     Start     Dose/Rate Route Frequency Ordered Stop   11/29/11 0600   ceFAZolin (ANCEF) IVPB 2 g/50 mL premix        2 g 100 mL/hr over 30 Minutes Intravenous 60 min pre-op 11/28/11 1340 11/29/11 0920          Assessment/Plan: s/p Procedure(s) (LRB): LAPAROSCOPIC CHOLECYSTECTOMY WITH INTRAOPERATIVE CHOLANGIOGRAM (N/A) Advance diet Plan for discharge tomorrow  LOS: 1 day    Brittain Smithey K. 11/30/2011

## 2011-12-01 MED ORDER — PROMETHAZINE HCL 12.5 MG PO TABS: 12.5000 mg | ORAL_TABLET | Freq: Four times a day (QID) | ORAL | Status: DC | PRN

## 2011-12-01 MED ORDER — OXYCODONE-ACETAMINOPHEN 5-325 MG PO TABS: 1.0000 | ORAL_TABLET | ORAL | Status: DC | PRN

## 2011-12-01 NOTE — Progress Notes (Signed)
Patient discharged to home, instructions given and verbalized understanding, accompanied by daughter

## 2011-12-01 NOTE — Discharge Summary (Signed)
Physician Discharge Summary  Patient ID: Karen Roberts MRN: [id number] DOB/AGE: [birth year] 41 y.o.  Admit date: 11/29/2011 Discharge date: 12/01/2011  Admission Diagnoses: Chronic calculus cholecystitis Discharge Diagnoses: same         Active Problems:  * No active hospital problems. *    Discharged Condition: good  Hospital Course: Underwent uncomplicated lap chole with IOC on 4/30.  Had significant problems with post-operative pain, so admitted to hospital.  Feels better today and pain is controlled with PO Percocet.  Mild nausea  Consults: None  Significant Diagnostic Studies: labs:  Lab Results  Component Value Date   WBC 15.5* 11/30/2011   HGB 11.4* 11/30/2011   HCT 33.4* 11/30/2011   MCV 87.9 11/30/2011   PLT 311 11/30/2011   Lab Results  Component Value Date   ALT 118* 11/30/2011   AST 91* 11/30/2011   ALKPHOS 60 11/30/2011   BILITOT 1.0 11/30/2011   Lab Results  Component Value Date   CREATININE 0.79 11/30/2011   BUN 11 11/30/2011   NA 135 11/30/2011   K 3.5 11/30/2011   CL 103 11/30/2011   CO2 24 11/30/2011     Treatments: IV hydration, surgery: lap chole with IOC and pain control with Toradol, Percocet, Dilaudid  Discharge Exam: Blood pressure 102/66, pulse 80, temperature 97.4 F (36.3 C), temperature source Oral, resp. rate 18, height 5\' 6"  (1.676 m), weight 212 lb 11.9 oz (96.5 kg), last menstrual period 10/29/2011, SpO2 90.00%. General appearance: alert, cooperative and no distress Incisions c/d/i  Disposition:  Discharge home  Discharge Orders    Future Appointments: Provider: Department: Dept Phone: Center:   12/13/2011 10:50 AM Wilmon Arms. Kamariah Fruchter, MD [facility] [phone] None     Future Orders Please Complete By Expires   Diet general      Diet general      Increase activity slowly      May walk up steps      May shower / Bathe      Driving Restrictions      Comments:   Do not drive while taking pain medications   Call MD for:  temperature >100.4      Call MD for:  persistant nausea and vomiting      Call MD for:  severe uncontrolled pain      Call MD for:  redness, tenderness, or signs of infection (pain, swelling, redness, odor or green/yellow discharge around incision site)      Increase activity slowly      May walk up steps      May shower / Bathe      Driving Restrictions      Comments:   Do not drive while taking pain medications   Call MD for:  temperature >100.4      Call MD for:  persistant nausea and vomiting      Call MD for:  severe uncontrolled pain      Call MD for:  redness, tenderness, or signs of infection (pain, swelling, redness, odor or green/yellow discharge around incision site)        Medication List  As of 12/01/2011  8:09 AM   TAKE these medications         ECHINACEA PO   Take 1 capsule by mouth daily.      ibuprofen 200 MG tablet   Commonly known as: ADVIL,MOTRIN   Take 200 mg by mouth as needed.      oxyCODONE-acetaminophen 5-325 MG per tablet  Commonly known as: PERCOCET   Take 1 tablet by mouth every 4 (four) hours as needed for pain.      oxyCODONE-acetaminophen 5-325 MG per tablet   Commonly known as: PERCOCET   Take 1-2 tablets by mouth every 4 (four) hours as needed.      phenylephrine 10 MG Tabs   Commonly known as: SUDAFED PE   Take 10 mg by mouth every 4 (four) hours as needed.      promethazine 12.5 MG tablet   Commonly known as: PHENERGAN   Take 1 tablet (12.5 mg total) by mouth every 6 (six) hours as needed for nausea.      vitamin C 500 MG tablet   Commonly known as: ASCORBIC ACID   Take 500 mg by mouth daily.           Follow-up Information    Follow up with Shilynn Hoch K., MD. Schedule an appointment as soon as possible for a visit in 3 weeks.   Contact information:   [employer] [address] [place] [address] [phone]          Signed: Wynona Luna. 12/01/2011, 8:09 AM

## 2011-12-01 NOTE — Progress Notes (Signed)
Patient felt lightheaded and nauseated, Zofran PRN given and made MD aware V/S within normal range, continue to discharge

## 2011-12-01 NOTE — Progress Notes (Signed)
2 Days Post-Op  Subjective: Somewhat improved.  Voiding well. Using PO pain meds   Objective: Vital signs in last 24 hours: Temp:  [97.4 F (36.3 C)-98.5 F (36.9 C)] 97.4 F (36.3 C) (05/02 0559) Pulse Rate:  [80-98] 80  (05/02 0559) Resp:  [16-20] 18  (05/02 0559) BP: (95-110)/(55-69) 102/66 mmHg (05/02 0559) SpO2:  [90 %-94 %] 90 % (05/02 0559) Weight:  [212 lb 11.9 oz (96.5 kg)] 212 lb 11.9 oz (96.5 kg) (05/02 0559) Last BM Date: 12/28/11  Intake/Output from previous day: 05/01 0701 - 05/02 0700 In: 1928 [P.O.:600; I.V.:1328] Out: -  Intake/Output this shift:    General appearance: alert, cooperative and no distress GI: soft, RUQ tenderness around incisions Incisions c/d/i  Lab Results:   Basename 11/30/11 1117 11/28/11 1036  WBC 15.5* 8.1  HGB 11.4* 13.5  HCT 33.4* 40.3  PLT 311 355   BMET  Basename 11/30/11 1117  NA 135  K 3.5  CL 103  CO2 24  GLUCOSE 101*  BUN 11  CREATININE 0.79  CALCIUM 8.6   Lab Results  Component Value Date   ALT 118* 11/30/2011   AST 91* 11/30/2011   ALKPHOS 60 11/30/2011   BILITOT 1.0 11/30/2011    PT/INR No results found for this basename: LABPROT:2,INR:2 in the last 72 hours ABG No results found for this basename: PHART:2,PCO2:2,PO2:2,HCO3:2 in the last 72 hours  Studies/Results: Dg Cholangiogram Operative  11/29/2011  *RADIOLOGY REPORT*  Clinical Data:   41 year old female undergoing laparoscopic cholecystectomy.  INTRAOPERATIVE CHOLANGIOGRAM  Technique:  Cholangiographic images from the C-arm fluoroscopic device were submitted for interpretation post-operatively.  Please see the procedural report for the amount of contrast and the fluoroscopy time utilized.  Comparison:  Abdominal ultrasound 02/25/2011 and earlier.  Fluoroscopy time of 0.1 minutes was utilized.  Findings:  Multiple intraoperative fluoroscopic images of the right upper quadrant.  Surgical clips at the level of the cystic duct which has been cannulated.   Contrast injection demonstrates nondilated intra and extrahepatic biliary system.  Prompt contrast transit to the duodenum.  No filling defect or extravasation.  IMPRESSION: Negative intraoperative cholangiogram.  Original Report Authenticated By: Harley Hallmark, M.D.    Anti-infectives: Anti-infectives     Start     Dose/Rate Route Frequency Ordered Stop   11/29/11 0600   ceFAZolin (ANCEF) IVPB 2 g/50 mL premix        2 g 100 mL/hr over 30 Minutes Intravenous 60 min pre-op 11/28/11 1340 11/29/11 0920          Assessment/Plan: s/p Procedure(s) (LRB): LAPAROSCOPIC CHOLECYSTECTOMY WITH INTRAOPERATIVE CHOLANGIOGRAM (N/A) Labs - WBC elevated on first post-op day; no evidence of bleeding, Tbili normal, Alk phos normal, AST/ALT slightly elevated Will discharge today on PO Percocet and PRN phenergan  LOS: 2 days    Karen Rizo K. 12/01/2011

## 2011-12-02 ENCOUNTER — Telehealth (INDEPENDENT_AMBULATORY_CARE_PROVIDER_SITE_OTHER): Payer: Self-pay | Admitting: General Surgery

## 2011-12-02 NOTE — Telephone Encounter (Signed)
Pt calling with complaints of "extreme pain" and "still cannot eat."  She states there is pain even when she sips water or tries to eat jell-o.  She is taking Percocet, but getting no relief.  Wants to speak with Dr. Corliss Skains.

## 2011-12-02 NOTE — Telephone Encounter (Signed)
Called pt and related message from Dr. Corliss Skains.  She is to take Phenergan to help control the nausea and pain.  May take 2 Percocet at a time.  Advised not to take them all at once, as she may become overly sedated, but wait 2-3 hours before taking the next.  She understands and will try this.

## 2011-12-04 ENCOUNTER — Inpatient Hospital Stay (HOSPITAL_COMMUNITY)
Admission: EM | Admit: 2011-12-04 | Discharge: 2011-12-08 | DRG: 556 | Disposition: A | Discharge status: Home / Self Care | Payer: 59 | Source: Ambulatory Visit | Attending: Surgery | Admitting: Surgery

## 2011-12-04 ENCOUNTER — Emergency Department (HOSPITAL_COMMUNITY): Payer: 59

## 2011-12-04 ENCOUNTER — Encounter (HOSPITAL_COMMUNITY): Payer: Self-pay | Admitting: Emergency Medicine

## 2011-12-04 DIAGNOSIS — Y836 Removal of other organ (partial) (total) as the cause of abnormal reaction of the patient, or of later complication, without mention of misadventure at the time of the procedure: Secondary | ICD-10-CM | POA: Diagnosis present

## 2011-12-04 DIAGNOSIS — M7981 Nontraumatic hematoma of soft tissue: Principal | ICD-10-CM | POA: Diagnosis present

## 2011-12-04 DIAGNOSIS — Z9049 Acquired absence of other specified parts of digestive tract: Secondary | ICD-10-CM

## 2011-12-04 DIAGNOSIS — K929 Disease of digestive system, unspecified: Secondary | ICD-10-CM | POA: Diagnosis present

## 2011-12-04 DIAGNOSIS — K5900 Constipation, unspecified: Secondary | ICD-10-CM | POA: Diagnosis present

## 2011-12-04 LAB — COMPREHENSIVE METABOLIC PANEL
Alkaline Phosphatase: 110 U/L (ref 39–117)
BUN: 7 mg/dL (ref 6–23)
CO2: 25 mEq/L (ref 19–32)
GFR calc Af Amer: >90 mL/min (ref >90)
GFR calc non Af Amer: >90 mL/min (ref >90)
Glucose, Bld: 111 mg/dL — ABNORMAL HIGH (ref 70–99)
Potassium: 3.4 mEq/L — ABNORMAL LOW (ref 3.5–5.1)
Total Protein: 7 g/dL (ref 6.0–8.3)

## 2011-12-04 LAB — LIPASE, BLOOD: Lipase: 33 U/L (ref 11–59)

## 2011-12-04 LAB — DIFFERENTIAL
Eosinophils Absolute: 0.4 10*3/uL (ref 0.0–0.7)
Eosinophils Relative: 3 % (ref 0–5)
Lymphs Abs: 1.5 10*3/uL (ref 0.7–4.0)
Monocytes Relative: 10 % (ref 3–12)
Neutrophils Relative %: 76 % (ref 43–77)

## 2011-12-04 LAB — CBC
Hemoglobin: 12.5 g/dL (ref 12.0–15.0)
MCH: 30.8 pg (ref 26.0–34.0)
MCV: 88.7 fL (ref 78.0–100.0)
RBC: 4.06 MIL/uL (ref 3.87–5.11)

## 2011-12-04 MED ORDER — SENNA 8.6 MG PO TABS
1.0000 | ORAL_TABLET | Freq: Two times a day (BID) | ORAL | Status: DC
  Administered 2011-12-04 – 2011-12-08 (×4): 8.6 mg via ORAL
  Filled 2011-12-04 (×10): qty 1

## 2011-12-04 MED ORDER — HYDROMORPHONE HCL PF 1 MG/ML IJ SOLN
1.0000 mg | Freq: Once | INTRAMUSCULAR | Status: AC
  Administered 2011-12-04: 1 mg via INTRAVENOUS
  Filled 2011-12-04: qty 1

## 2011-12-04 MED ORDER — DOCUSATE SODIUM 100 MG PO CAPS
100.0000 mg | ORAL_CAPSULE | Freq: Two times a day (BID) | ORAL | Status: DC
Start: 2011-12-04 — End: 2011-12-08
  Administered 2011-12-04 – 2011-12-08 (×4): 100 mg via ORAL
  Filled 2011-12-04 (×6): qty 1

## 2011-12-04 MED ORDER — ONDANSETRON HCL 4 MG/2ML IJ SOLN
4.0000 mg | Freq: Four times a day (QID) | INTRAMUSCULAR | Status: DC | PRN
  Administered 2011-12-04 – 2011-12-06 (×4): 4 mg via INTRAVENOUS
  Filled 2011-12-04 (×4): qty 2

## 2011-12-04 MED ORDER — IOHEXOL 300 MG/ML  SOLN: 20.0000 mL | INTRAMUSCULAR | Status: DC

## 2011-12-04 MED ORDER — HYDROMORPHONE HCL PF 1 MG/ML IJ SOLN
INTRAMUSCULAR | Status: AC
  Administered 2011-12-04: 1 mg via INTRAVENOUS
  Filled 2011-12-04: qty 1

## 2011-12-04 MED ORDER — IOHEXOL 300 MG/ML  SOLN
100.0000 mL | Freq: Once | INTRAMUSCULAR | Status: AC | PRN
  Administered 2011-12-04: 100 mL via INTRAVENOUS

## 2011-12-04 MED ORDER — HYDROMORPHONE HCL PF 1 MG/ML IJ SOLN
1.0000 mg | Freq: Once | INTRAMUSCULAR | Status: AC
  Administered 2011-12-04: 1 mg via INTRAVENOUS

## 2011-12-04 MED ORDER — PIPERACILLIN-TAZOBACTAM 3.375 G IVPB
3.3750 g | Freq: Three times a day (TID) | INTRAVENOUS | Status: DC
  Administered 2011-12-04 – 2011-12-07 (×8): 3.375 g via INTRAVENOUS
  Filled 2011-12-04 (×9): qty 50

## 2011-12-04 MED ORDER — DIPHENHYDRAMINE HCL 50 MG/ML IJ SOLN: 12.5000 mg | Freq: Four times a day (QID) | INTRAMUSCULAR | Status: DC | PRN

## 2011-12-04 MED ORDER — PANTOPRAZOLE SODIUM 40 MG IV SOLR
40.0000 mg | Freq: Every day | INTRAVENOUS | Status: DC
  Administered 2011-12-04 – 2011-12-05 (×2): 40 mg via INTRAVENOUS
  Filled 2011-12-04 (×3): qty 40

## 2011-12-04 MED ORDER — HYDROMORPHONE HCL PF 1 MG/ML IJ SOLN
1.0000 mg | INTRAMUSCULAR | Status: DC | PRN
  Administered 2011-12-04 – 2011-12-08 (×23): 1 mg via INTRAVENOUS
  Filled 2011-12-04 (×23): qty 1

## 2011-12-04 MED ORDER — SODIUM CHLORIDE 0.9 % IV SOLN
Freq: Once | INTRAVENOUS | Status: AC
  Administered 2011-12-04: 14:00:00 via INTRAVENOUS

## 2011-12-04 MED ORDER — PROMETHAZINE HCL 25 MG/ML IJ SOLN
12.5000 mg | Freq: Four times a day (QID) | INTRAMUSCULAR | Status: DC | PRN
  Administered 2011-12-04: 12.5 mg via INTRAVENOUS
  Administered 2011-12-05 – 2011-12-08 (×3): 25 mg via INTRAVENOUS
  Filled 2011-12-04 (×4): qty 1

## 2011-12-04 MED ORDER — POLYETHYLENE GLYCOL 3350 17 G PO PACK
17.0000 g | PACK | Freq: Every day | ORAL | Status: DC | PRN
  Filled 2011-12-04: qty 1

## 2011-12-04 MED ORDER — DIPHENHYDRAMINE HCL 12.5 MG/5ML PO ELIX
12.5000 mg | ORAL_SOLUTION | Freq: Four times a day (QID) | ORAL | Status: DC | PRN
  Filled 2011-12-04: qty 10

## 2011-12-04 MED ORDER — ONDANSETRON HCL 4 MG/2ML IJ SOLN
4.0000 mg | Freq: Once | INTRAMUSCULAR | Status: AC
  Administered 2011-12-04: 4 mg via INTRAVENOUS
  Filled 2011-12-04: qty 2

## 2011-12-04 MED ORDER — KCL IN DEXTROSE-NACL 20-5-0.45 MEQ/L-%-% IV SOLN
INTRAVENOUS | Status: DC
  Administered 2011-12-04 – 2011-12-07 (×7): via INTRAVENOUS
  Filled 2011-12-04 (×9): qty 1000

## 2011-12-04 NOTE — ED Provider Notes (Signed)
  Physical Exam  BP 106/69  Pulse 108  Temp(Src) 99.5 F (37.5 C) (Oral)  Resp 18  SpO2 98%  LMP 10/28/2011  Physical Exam  ED Course  Procedures  MDM Care of patient from Dr Judd Lien. Ct done and showed potential biliary leak. Admitted to surgery      Juliet Rude. Rubin Payor, MD 12/04/11 [phone]

## 2011-12-04 NOTE — ED Notes (Signed)
I gave the patient a cup of ice and a sprite. 

## 2011-12-04 NOTE — ED Provider Notes (Signed)
History     CSN: [id number]  Arrival date & time 12/04/11  1309   First MD Initiated Contact with Patient 12/04/11 1316      Chief Complaint  Patient presents with  . Abdominal Pain    (Consider location/radiation/quality/duration/timing/severity/associated sxs/prior treatment) HPI Comments: Patient is 5 days status post lap chole bu Dr. Harlon Flor.  Presents today complaining of severe abd pain, nausea, unable to eat or drink due to pain.  No fever.  Patient is a 41 y.o. female presenting with abdominal pain. The history is provided by the patient.  Abdominal Pain The primary symptoms of the illness include abdominal pain and nausea. The primary symptoms of the illness do not include fever, fatigue, vomiting, diarrhea or dysuria. The current episode started 2 days ago. The onset of the illness was gradual. The problem has been rapidly worsening.  The patient states that she believes she is currently not pregnant. The patient has not had a change in bowel habit.    Past Medical History  Diagnosis Date  . Allergy   . Anxiety   . PONV (postoperative nausea and vomiting)   . Ligament tear     left  . Headache     "every once in awhile"    Past Surgical History  Procedure Date  . Cesarean section 2000  . Appendectomy 2011  . Laparoscopic cholecystectomy 11/29/11    w/IOC  . Cholecystectomy 11/29/2011    Procedure: LAPAROSCOPIC CHOLECYSTECTOMY WITH INTRAOPERATIVE CHOLANGIOGRAM;  Surgeon: Wilmon Arms. Corliss Skains, MD;  Location: [facility] OR;  Service: General;  Laterality: N/A;    Family History  Problem Relation Age of Onset  . Cancer Maternal Grandmother     lung/brain  . Anesthesia problems Neg Hx     History  Substance Use Topics  . Smoking status: Never Smoker   . Smokeless tobacco: Never Used  . Alcohol Use: Yes     11/29/11 "maybe once/ monthly [place] iced teas"    OB History    Grav Para Term Preterm Abortions TAB SAB Ect Mult Living                  Review of Systems   Constitutional: Negative for fever and fatigue.  Gastrointestinal: Positive for nausea and abdominal pain. Negative for vomiting and diarrhea.  Genitourinary: Negative for dysuria.  All other systems reviewed and are negative.    Allergies  Ciprofloxacin  Home Medications   Current Outpatient Rx  Name Route Sig Dispense Refill  . OXYCODONE-ACETAMINOPHEN 5-325 MG PO TABS Oral Take 1 tablet by mouth every 4 (four) hours as needed. For pain.    Marland Kitchen PROMETHAZINE HCL 12.5 MG PO TABS Oral Take 12.5 mg by mouth every 6 (six) hours as needed. For nausea      BP 112/87  Pulse 135  Temp(Src) 98.3 F (36.8 C) (Oral)  Resp 24  SpO2 97%  LMP 10/28/2011  Physical Exam  Nursing note and vitals reviewed. Constitutional: She is oriented to person, place, and time. She appears well-developed and well-nourished.       Tearful, crying.  HENT:  Head: Normocephalic and atraumatic.  Neck: Normal range of motion. Neck supple.  Cardiovascular: Normal rate and regular rhythm.   Pulmonary/Chest: Effort normal and breath sounds normal.  Abdominal:       The abdomen is obese.  The incision sites appear clean, and there is no erythema or drainage.  The abd is ttp in all four quadrants, most severe in the ruq.  No rebound or guarding.  Musculoskeletal: Normal range of motion. She exhibits no edema.  Neurological: She is alert and oriented to person, place, and time.  Skin: Skin is warm and dry.    ED Course  Procedures (including critical care time)  Labs Reviewed - No data to display No results found.   No diagnosis found.    MDM  The labs show a wbc of 14k.  She has been given two doses of dilaudid without much relief.  A CT of the abdomen and pelvis has been ordered and Dr. Harlon Flor has been consulted.  At this point, the patient's care will be signed out to Dr. Rubin Payor at shift change.          Geoffery Lyons, MD 12/05/11 [phone]

## 2011-12-04 NOTE — H&P (Signed)
Karen Roberts is an 41 y.o. female.   Chief Complaint: post-operative abdominal pain HPI: 41 yo female s/p laparoscopic cholecystectomy on 11/29/11.  Intraoperative cholangiogram was normal.  The patient has had significant right-sided abdominal pain ever since surgery.  She was discharged home on POD #2 on PO Percocet.  However, the pain and nausea have persisted, so she is brought back to the ED.  She has had minimal PO intake.  Past Medical History  Diagnosis Date  . Allergy   . Anxiety   . PONV (postoperative nausea and vomiting)   . Ligament tear     left  . Headache     "every once in awhile"    Past Surgical History  Procedure Date  . Cesarean section 2000  . Appendectomy 2011  . Laparoscopic cholecystectomy 11/29/11    w/IOC  . Cholecystectomy 11/29/2011    Procedure: LAPAROSCOPIC CHOLECYSTECTOMY WITH INTRAOPERATIVE CHOLANGIOGRAM;  Surgeon: Wilmon Arms. Corliss Skains, MD;  Location: [facility] OR;  Service: General;  Laterality: N/A;    Family History  Problem Relation Age of Onset  . Cancer Maternal Grandmother     lung/brain  . Anesthesia problems Neg Hx    Social History:  reports that she has never smoked. She has never used smokeless tobacco. She reports that she drinks alcohol. She reports that she does not use illicit drugs.  Allergies:  Allergies  Allergen Reactions  . Ciprofloxacin Dermatitis    "dr's think it may have caused mastitis"     (Not in a hospital admission)  Results for orders placed during the hospital encounter of 12/04/11 (from the past 48 hour(s))  CBC     Status: Abnormal   Collection Time   12/04/11  1:36 PM      Component Value Range Comment   WBC 14.2 (*) 4.0 - 10.5 (K/uL)    RBC 4.06  3.87 - 5.11 (MIL/uL)    Hemoglobin 12.5  12.0 - 15.0 (g/dL)    HCT 16.1  09.6 - 04.5 (%)    MCV 88.7  78.0 - 100.0 (fL)    MCH 30.8  26.0 - 34.0 (pg)    MCHC 34.7  30.0 - 36.0 (g/dL)    RDW 40.9  81.1 - 91.4 (%)    Platelets 420 (*) 150 - 400 (K/uL)     DIFFERENTIAL     Status: Abnormal   Collection Time   12/04/11  1:36 PM      Component Value Range Comment   Neutrophils Relative 76  43 - 77 (%)    Neutro Abs 10.8 (*) 1.7 - 7.7 (K/uL)    Lymphocytes Relative 11 (*) 12 - 46 (%)    Lymphs Abs 1.5  0.7 - 4.0 (K/uL)    Monocytes Relative 10  3 - 12 (%)    Monocytes Absolute 1.4 (*) 0.1 - 1.0 (K/uL)    Eosinophils Relative 3  0 - 5 (%)    Eosinophils Absolute 0.4  0.0 - 0.7 (K/uL)    Basophils Relative 0  0 - 1 (%)    Basophils Absolute 0.0  0.0 - 0.1 (K/uL)   COMPREHENSIVE METABOLIC PANEL     Status: Abnormal   Collection Time   12/04/11  1:36 PM      Component Value Range Comment   Sodium 135  135 - 145 (mEq/L)    Potassium 3.4 (*) 3.5 - 5.1 (mEq/L)    Chloride 99  96 - 112 (mEq/L)    CO2 25  19 - 32 (mEq/L)    Glucose, Bld 111 (*) 70 - 99 (mg/dL)    BUN 7  6 - 23 (mg/dL)    Creatinine, Ser 1.61  0.50 - 1.10 (mg/dL)    Calcium 9.1  8.4 - 10.5 (mg/dL)    Total Protein 7.0  6.0 - 8.3 (g/dL)    Albumin 3.2 (*) 3.5 - 5.2 (g/dL)    AST 20  0 - 37 (U/L)    ALT 54 (*) 0 - 35 (U/L)    Alkaline Phosphatase 110  39 - 117 (U/L)    Total Bilirubin 1.1  0.3 - 1.2 (mg/dL)    GFR calc non Af Amer >90  >90 (mL/min)    GFR calc Af Amer >90  >90 (mL/min)   LIPASE, BLOOD     Status: Normal   Collection Time   12/04/11  1:36 PM      Component Value Range Comment   Lipase 33  11 - 59 (U/L)    Ct Abdomen Pelvis W Contrast  12/04/2011  *RADIOLOGY REPORT*  Clinical Data: Abdominal and back pain.  Recent gallbladder surgery.  CT ABDOMEN AND PELVIS WITH CONTRAST  Technique:  Multidetector CT imaging of the abdomen and pelvis was performed following the standard protocol during bolus administration of intravenous contrast.  Contrast: OMNIPAQUE IOHEXOL 300 MG/ML  SOLN  Comparison: Intraoperative cholangiogram 11/29/2011.  Findings: The lung bases demonstrate small effusions and bibasilar atelectasis.  The liver is normal.  No focal lesions or  intrahepatic biliary dilatation.  There is moderate fluid in the gallbladder fossa and fluid tracking down the right pericolic gutter and into the pelvis where there is moderate free pelvic fluid.  It is possible this is all postoperative change but the possibility of a bile leak is radial and should be excluded.  No abscess.  The common bile duct appears normal in caliber.  The pancreas is normal.  The spleen is normal in size.  No focal lesions.  The adrenal glands and kidneys are normal.  The stomach, duodenum, small bowel and colon are unremarkable.  No mesenteric or retroperitoneal mass, adenopathy or hematoma.  The aorta is normal in caliber.  The major branch vessels are normal.  Mild interstitial / inflammatory changes in the subcutaneous fat overlying the right side of the abdomen on could be mild cellulitis.  No focal fluid collection or abscess.  The uterus and ovaries are unremarkable.  The bladder is normal. No inguinal mass or hernia.  The bony structures are unremarkable.  IMPRESSION:  1.  Moderate free fluid in the gallbladder fossa and lesser sac along with moderate free pelvic fluid.  A bile leak is a definite consideration and should be excluded. 2.  Mild interstitial / inflammatory changes in the subcutaneous fat overlying the right side of the abdomen on could be mild cellulitis.  No focal fluid collection or abscess. 3.  Small pleural effusions and bibasilar atelectasis.  Original Report Authenticated By: P. Loralie Champagne, M.D.    ROS  Blood pressure 107/66, pulse 93, temperature 99.5 F (37.5 C), temperature source Oral, resp. rate 18, last menstrual period 10/28/2011, SpO2 96.00%. Physical Exam  WDWN - appears uncomfortable HEENT - EOMI, sclera anicteric Neck:  No masses, no thyromegaly Lungs:  CTA bilaterally; normal respiratory effort CV:  Regular rate and rhythm; no murmurs Abd:  +bowel sounds, soft, tender RUQ down to RLQ Incisions c/d/i Ext:  Well-perfused; no  edema Skin:  Warm, dry; no sign of jaundice  Assessment/Plan Post-operative pain/ nausea after lap chole CT shows fluid - cannot rule out bile leak Will obtain HIDA scan tomorrow to rule out bile leak  Avante Carneiro K. 12/04/2011, 5:43 PM

## 2011-12-04 NOTE — ED Notes (Signed)
Pt here with severe abd pain after having gall bladder sx this week; pt sts no BM since Monday; pt tachycardic and tearful

## 2011-12-04 NOTE — ED Notes (Signed)
I gave the patient another pillow.

## 2011-12-04 NOTE — ED Notes (Signed)
NT reports that pt didn't want her daughters back yet

## 2011-12-04 NOTE — ED Notes (Signed)
I gave the patients visitor a cup of ice and a sprite. 

## 2011-12-05 ENCOUNTER — Encounter (HOSPITAL_COMMUNITY): Payer: Self-pay | Admitting: *Deleted

## 2011-12-05 ENCOUNTER — Inpatient Hospital Stay (HOSPITAL_COMMUNITY): Payer: 59

## 2011-12-05 MED ORDER — TECHNETIUM TC 99M MEBROFENIN IV KIT
5.0000 | PACK | Freq: Once | INTRAVENOUS | Status: AC | PRN
  Administered 2011-12-05: 5 via INTRAVENOUS

## 2011-12-05 NOTE — Progress Notes (Signed)
Subjective: Still c/o right-sided abdominal pain - better controlled with IV pain meds No nausea currently HIDA scan today  Objective: Vital signs in last 24 hours: Temp:  [97.9 F (36.6 C)-99.6 F (37.6 C)] 98.2 F (36.8 C) (05/06 0551) Pulse Rate:  [91-135] 98  (05/06 0551) Resp:  [18-24] 20  (05/06 0551) BP: (101-120)/(62-87) 104/66 mmHg (05/06 0551) SpO2:  [94 %-99 %] 99 % (05/06 0551) Weight:  [212 lb (96.163 kg)] 212 lb (96.163 kg) (05/06 0500) Last BM Date: 11/28/11  Intake/Output from previous day: 05/05 0701 - 05/06 0700 In: 1348 [I.V.:1348] Out: 700 [Urine:700] Intake/Output this shift:    General appearance: alert, cooperative and no distress GI: soft, active bowel sounds; bruising between two lateral port sites Incisions c/d/i  Lab Results:   Basename 12/04/11 1336  WBC 14.2*  HGB 12.5  HCT 36.0  PLT 420*   BMET  Basename 12/04/11 1336  NA 135  K 3.4*  CL 99  CO2 25  GLUCOSE 111*  BUN 7  CREATININE 0.77  CALCIUM 9.1   PT/INR No results found for this basename: LABPROT:2,INR:2 in the last 72 hours ABG No results found for this basename: PHART:2,PCO2:2,PO2:2,HCO3:2 in the last 72 hours  Studies/Results: Ct Abdomen Pelvis W Contrast  12/04/2011  *RADIOLOGY REPORT*  Clinical Data: Abdominal and back pain.  Recent gallbladder surgery.  CT ABDOMEN AND PELVIS WITH CONTRAST  Technique:  Multidetector CT imaging of the abdomen and pelvis was performed following the standard protocol during bolus administration of intravenous contrast.  Contrast: OMNIPAQUE IOHEXOL 300 MG/ML  SOLN  Comparison: Intraoperative cholangiogram 11/29/2011.  Findings: The lung bases demonstrate small effusions and bibasilar atelectasis.  The liver is normal.  No focal lesions or intrahepatic biliary dilatation.  There is moderate fluid in the gallbladder fossa and fluid tracking down the right pericolic gutter and into the pelvis where there is moderate free pelvic fluid.   It is possible this is all postoperative change but the possibility of a bile leak is radial and should be excluded.  No abscess.  The common bile duct appears normal in caliber.  The pancreas is normal.  The spleen is normal in size.  No focal lesions.  The adrenal glands and kidneys are normal.  The stomach, duodenum, small bowel and colon are unremarkable.  No mesenteric or retroperitoneal mass, adenopathy or hematoma.  The aorta is normal in caliber.  The major branch vessels are normal.  Mild interstitial / inflammatory changes in the subcutaneous fat overlying the right side of the abdomen on could be mild cellulitis.  No focal fluid collection or abscess.  The uterus and ovaries are unremarkable.  The bladder is normal. No inguinal mass or hernia.  The bony structures are unremarkable.  IMPRESSION:  1.  Moderate free fluid in the gallbladder fossa and lesser sac along with moderate free pelvic fluid.  A bile leak is a definite consideration and should be excluded. 2.  Mild interstitial / inflammatory changes in the subcutaneous fat overlying the right side of the abdomen on could be mild cellulitis.  No focal fluid collection or abscess. 3.  Small pleural effusions and bibasilar atelectasis.  Original Report Authenticated By: P. Loralie Champagne, M.D.    Anti-infectives: Anti-infectives     Start     Dose/Rate Route Frequency Ordered Stop   12/04/11 2200   piperacillin-tazobactam (ZOSYN) IVPB 3.375 g        3.375 g 12.5 mL/hr over 240 Minutes Intravenous 3 times per  day 12/04/11 1949            Assessment/Plan: s/p * No surgery found * HIDA scan to rule out bile leak Start clears after HIDA scan has been read by radiology If HIDA is negative, this is probably musculoskeletal pain (?hematoma in abdominal wall?)  LOS: 1 day    Makyiah Lie K. 12/05/2011

## 2011-12-06 LAB — CBC
HCT: 33.1 % — ABNORMAL LOW (ref 36.0–46.0)
MCH: 29.7 pg (ref 26.0–34.0)
MCHC: 33.5 g/dL (ref 30.0–36.0)
MCV: 88.5 fL (ref 78.0–100.0)
Platelets: 426 10*3/uL — ABNORMAL HIGH (ref 150–400)
RDW: 13.6 % (ref 11.5–15.5)
WBC: 10.7 10*3/uL — ABNORMAL HIGH (ref 4.0–10.5)

## 2011-12-06 LAB — COMPREHENSIVE METABOLIC PANEL
AST: 21 U/L (ref 0–37)
Albumin: 2.8 g/dL — ABNORMAL LOW (ref 3.5–5.2)
BUN: 3 mg/dL — ABNORMAL LOW (ref 6–23)
Calcium: 8.9 mg/dL (ref 8.4–10.5)
Chloride: 103 mEq/L (ref 96–112)
Creatinine, Ser: 0.79 mg/dL (ref 0.50–1.10)
Total Bilirubin: 0.9 mg/dL (ref 0.3–1.2)
Total Protein: 6.5 g/dL (ref 6.0–8.3)

## 2011-12-06 MED ORDER — CHLORHEXIDINE GLUCONATE 0.12 % MT SOLN
15.0000 mL | Freq: Two times a day (BID) | OROMUCOSAL | Status: DC
  Administered 2011-12-06 – 2011-12-07 (×3): 15 mL via OROMUCOSAL
  Filled 2011-12-06 (×3): qty 15

## 2011-12-06 MED ORDER — POLYETHYLENE GLYCOL 3350 17 G PO PACK
17.0000 g | PACK | Freq: Once | ORAL | Status: AC
  Administered 2011-12-06: 17 g via ORAL
  Filled 2011-12-06: qty 1

## 2011-12-06 MED ORDER — BIOTENE DRY MOUTH MT LIQD
15.0000 mL | Freq: Two times a day (BID) | OROMUCOSAL | Status: DC
  Administered 2011-12-06 – 2011-12-07 (×4): 15 mL via OROMUCOSAL

## 2011-12-06 MED ORDER — OXYCODONE-ACETAMINOPHEN 5-325 MG PO TABS: 1.0000 | ORAL_TABLET | ORAL | Status: DC | PRN

## 2011-12-06 MED ORDER — POLYETHYLENE GLYCOL 3350 17 G PO PACK
17.0000 g | PACK | Freq: Every day | ORAL | Status: DC | PRN
  Filled 2011-12-06: qty 1

## 2011-12-06 MED ORDER — PANTOPRAZOLE SODIUM 40 MG PO TBEC
40.0000 mg | DELAYED_RELEASE_TABLET | Freq: Every day | ORAL | Status: DC
  Administered 2011-12-06 – 2011-12-08 (×3): 40 mg via ORAL
  Filled 2011-12-06 (×3): qty 1

## 2011-12-06 MED ORDER — KETOROLAC TROMETHAMINE 30 MG/ML IJ SOLN
30.0000 mg | Freq: Four times a day (QID) | INTRAMUSCULAR | Status: AC
  Administered 2011-12-06 – 2011-12-07 (×8): 30 mg via INTRAVENOUS
  Filled 2011-12-06 (×8): qty 1

## 2011-12-06 NOTE — Progress Notes (Signed)
Orthopedic Tech Progress Note Patient Details:  Karen Roberts 1971-02-23 [id number]  Patient ID: [name], female   DOB: Feb 13, 1971, 41 y.o.   MRN: [id number] Viewed order from doctor's order list  [name] 12/06/2011, 7:34 PM

## 2011-12-06 NOTE — Progress Notes (Signed)
Orthopedic Tech Progress Note Patient Details:  TYYNE CLIETT 06/06/1971 [id number]  Other Ortho Devices Type of Ortho Device:  (abdominal binder) Ortho Device Location: abdomen Ortho Device Interventions: Freeman Caldron, Demara Lover 12/06/2011, 7:34 PM

## 2011-12-06 NOTE — Progress Notes (Signed)
Subjective: Patient still sore on her right side; nausea improved  CT showed stranding in the muscle and subcutaneous tissue of the right abdominal wall - ?hematoma vs early cellulitis; bruising in skin shows probably this represents hematoma; no drainable fluid collection  HIDA - small contained bile leak with minimal fluid collection; not large enough for drain  Objective: Vital signs in last 24 hours: Temp:  [98.4 F (36.9 C)-99.5 F (37.5 C)] 98.4 F (36.9 C) (05/07 0604) Pulse Rate:  [92-99] 94  (05/07 0604) Resp:  [16-19] 18  (05/07 0604) BP: (94-113)/(52-67) 113/67 mmHg (05/07 0604) SpO2:  [93 %-96 %] 94 % (05/07 0604) Last BM Date: 11/28/11  Intake/Output from previous day: 05/06 0701 - 05/07 0700 In: 3088.9 [I.V.:2996.4; IV Piggyback:92.5] Out: 1375 [Urine:1375] Intake/Output this shift:    General appearance: alert, cooperative and no distress GI: soft; right abdominal wall tenderness; darker bruising today;  incisions c/d/i; no midline tenderness  Lab Results:   Basename 12/06/11 0610 12/04/11 1336  WBC 10.7* 14.2*  HGB 11.1* 12.5  HCT 33.1* 36.0  PLT 426* 420*   BMET  Basename 12/06/11 0610 12/04/11 1336  NA 137 135  K 3.4* 3.4*  CL 103 99  CO2 23 25  GLUCOSE 135* 111*  BUN 3* 7  CREATININE 0.79 0.77  CALCIUM 8.9 9.1   PT/INR No results found for this basename: LABPROT:2,INR:2 in the last 72 hours ABG No results found for this basename: PHART:2,PCO2:2,PO2:2,HCO3:2 in the last 72 hours  Studies/Results: Nm Hepatobiliary  12/05/2011  *RADIOLOGY REPORT*  Clinical Data: Bile leak following cholecystectomy.  NUCLEAR MEDICINE HEPATOHBILIARY INCLUDE GB  Radiopharmaceutical:  5 mCi technetium 99 Choletec.  Comparison: CT abdomen 12/04/2011.  11/29/2011 cholangiogram.  Findings: Initial hepatic uptake is normal.  There is good delayed excretion of contrast from the liver, filling the porta hepatis. This filling is abnormal and persists on delayed  imaging.  This correlates with the area of fluid seen on CT 12/04/2011 and is suspicious for bile leak into the porta hepatis.  There is no redistribution along the pericolic gutters or in Morison's pouch however this collection of contrast in the porta hepatis persists after the duodenum empties radiotracer.  IMPRESSION: Post cholecystectomy bile leak in the porta hepatis.  Study reviewed in conjunction with Dr. Amil Amen of [facility] and Dr. Andrey Campanile who was covering for Dr. Corliss Skains.  Original Report Authenticated By: Andreas Newport, M.D.   Ct Abdomen Pelvis W Contrast  12/04/2011  *RADIOLOGY REPORT*  Clinical Data: Abdominal and back pain.  Recent gallbladder surgery.  CT ABDOMEN AND PELVIS WITH CONTRAST  Technique:  Multidetector CT imaging of the abdomen and pelvis was performed following the standard protocol during bolus administration of intravenous contrast.  Contrast: OMNIPAQUE IOHEXOL 300 MG/ML  SOLN  Comparison: Intraoperative cholangiogram 11/29/2011.  Findings: The lung bases demonstrate small effusions and bibasilar atelectasis.  The liver is normal.  No focal lesions or intrahepatic biliary dilatation.  There is moderate fluid in the gallbladder fossa and fluid tracking down the right pericolic gutter and into the pelvis where there is moderate free pelvic fluid.  It is possible this is all postoperative change but the possibility of a bile leak is radial and should be excluded.  No abscess.  The common bile duct appears normal in caliber.  The pancreas is normal.  The spleen is normal in size.  No focal lesions.  The adrenal glands and kidneys are normal.  The stomach, duodenum, small bowel and colon are unremarkable.  No mesenteric or retroperitoneal mass, adenopathy or hematoma.  The aorta is normal in caliber.  The major branch vessels are normal.  Mild interstitial / inflammatory changes in the subcutaneous fat overlying the right side of the abdomen on could be mild cellulitis.  No focal fluid  collection or abscess.  The uterus and ovaries are unremarkable.  The bladder is normal. No inguinal mass or hernia.  The bony structures are unremarkable.  IMPRESSION:  1.  Moderate free fluid in the gallbladder fossa and lesser sac along with moderate free pelvic fluid.  A bile leak is a definite consideration and should be excluded. 2.  Mild interstitial / inflammatory changes in the subcutaneous fat overlying the right side of the abdomen on could be mild cellulitis.  No focal fluid collection or abscess. 3.  Small pleural effusions and bibasilar atelectasis.  Original Report Authenticated By: P. Loralie Champagne, M.D.    Anti-infectives: Anti-infectives     Start     Dose/Rate Route Frequency Ordered Stop   12/04/11 2200   piperacillin-tazobactam (ZOSYN) IVPB 3.375 g        3.375 g 12.5 mL/hr over 240 Minutes Intravenous 3 times per day 12/04/11 1949            Assessment/Plan: s/p * No surgery found * Right abdominal wall hematoma/ stranding Small contained bile leak without free flow of radiotracer on HIDA scan - no acute intervention Continue antibiotics for now - she seems to be improving slowly Add Toradol for pain control Ambulate Clear liquids today Miralax for bowel movement  LOS: 2 days    Dashel Goines K. 12/06/2011

## 2011-12-07 MED ORDER — AMOXICILLIN-POT CLAVULANATE 875-125 MG PO TABS
1.0000 | ORAL_TABLET | Freq: Two times a day (BID) | ORAL | Status: DC
  Administered 2011-12-07 – 2011-12-08 (×3): 1 via ORAL
  Filled 2011-12-07 (×4): qty 1

## 2011-12-07 MED ORDER — HYDROMORPHONE HCL 2 MG PO TABS
2.0000 mg | ORAL_TABLET | ORAL | Status: DC | PRN
  Administered 2011-12-07 – 2011-12-08 (×4): 2 mg via ORAL
  Filled 2011-12-07 (×2): qty 1
  Filled 2011-12-07: qty 2
  Filled 2011-12-07 (×2): qty 1

## 2011-12-07 MED ORDER — BISACODYL 10 MG RE SUPP
10.0000 mg | Freq: Once | RECTAL | Status: AC
  Administered 2011-12-07: 10 mg via RECTAL
  Filled 2011-12-07: qty 1

## 2011-12-07 MED ORDER — POLYETHYLENE GLYCOL 3350 17 G PO PACK
17.0000 g | PACK | Freq: Once | ORAL | Status: AC
  Administered 2011-12-07: 17 g via ORAL
  Filled 2011-12-07: qty 1

## 2011-12-07 NOTE — Progress Notes (Signed)
UR complete 

## 2011-12-07 NOTE — Progress Notes (Signed)
  Subjective: Tolerating clear liquids without problems Flatus, no bowel movements Still with right abdominal wall soreness, but improved with Toradol  Objective: Vital signs in last 24 hours: Temp:  [97.6 F (36.4 C)-98.8 F (37.1 C)] 97.6 F (36.4 C) (05/08 0515) Pulse Rate:  [77-88] 86  (05/08 0515) Resp:  [18-19] 18  (05/08 0515) BP: (97-121)/(52-64) 102/61 mmHg (05/08 0515) SpO2:  [96 %-98 %] 98 % (05/08 0515) Last BM Date: 11/28/11  Intake/Output from previous day: 05/07 0701 - 05/08 0700 In: 3066 [I.V.:2891; IV Piggyback:175] Out: 2525 [Urine:2525] Intake/Output this shift:    General appearance: alert, cooperative and no distress GI: soft, active bowel sounds, bruising/ tenderness RUQ incisions c/d/i  Lab Results:   Basename 12/06/11 0610 12/04/11 1336  WBC 10.7* 14.2*  HGB 11.1* 12.5  HCT 33.1* 36.0  PLT 426* 420*   BMET  Basename 12/06/11 0610 12/04/11 1336  NA 137 135  K 3.4* 3.4*  CL 103 99  CO2 23 25  GLUCOSE 135* 111*  BUN 3* 7  CREATININE 0.79 0.77  CALCIUM 8.9 9.1   PT/INR No results found for this basename: LABPROT:2,INR:2 in the last 72 hours ABG No results found for this basename: PHART:2,PCO2:2,PO2:2,HCO3:2 in the last 72 hours  Studies/Results: Nm Hepatobiliary  12/05/2011  *RADIOLOGY REPORT*  Clinical Data: Bile leak following cholecystectomy.  NUCLEAR MEDICINE HEPATOHBILIARY INCLUDE GB  Radiopharmaceutical:  5 mCi technetium 99 Choletec.  Comparison: CT abdomen 12/04/2011.  11/29/2011 cholangiogram.  Findings: Initial hepatic uptake is normal.  There is good delayed excretion of contrast from the liver, filling the porta hepatis. This filling is abnormal and persists on delayed imaging.  This correlates with the area of fluid seen on CT 12/04/2011 and is suspicious for bile leak into the porta hepatis.  There is no redistribution along the pericolic gutters or in Morison's pouch however this collection of contrast in the porta hepatis  persists after the duodenum empties radiotracer.  IMPRESSION: Post cholecystectomy bile leak in the porta hepatis.  Study reviewed in conjunction with Dr. Amil Amen of [facility] and Dr. Andrey Campanile who was covering for Dr. Corliss Skains.  Original Report Authenticated By: Andreas Newport, M.D.    Anti-infectives: Anti-infectives     Start     Dose/Rate Route Frequency Ordered Stop   12/04/11 2200   piperacillin-tazobactam (ZOSYN) IVPB 3.375 g        3.375 g 12.5 mL/hr over 240 Minutes Intravenous 3 times per day 12/04/11 1949            Assessment/Plan: Abdominal wall hematoma/ possible cellulitis Possible small contained bile leak Plan for discharge tomorrow saline lock PO Dilaudid Bowel regimen PO antibiotics Follow-up ultrasound in 1 week   LOS: 3 days    Karen Dancy K. 12/07/2011

## 2011-12-08 ENCOUNTER — Other Ambulatory Visit (INDEPENDENT_AMBULATORY_CARE_PROVIDER_SITE_OTHER): Payer: Self-pay | Admitting: Surgery

## 2011-12-08 ENCOUNTER — Telehealth (INDEPENDENT_AMBULATORY_CARE_PROVIDER_SITE_OTHER): Payer: Self-pay | Admitting: General Surgery

## 2011-12-08 MED ORDER — AMOXICILLIN-POT CLAVULANATE 875-125 MG PO TABS: 1.0000 | ORAL_TABLET | Freq: Two times a day (BID) | ORAL | Status: DC

## 2011-12-08 MED ORDER — HYDROMORPHONE HCL 2 MG PO TABS: 2.0000 mg | ORAL_TABLET | ORAL | Status: DC | PRN

## 2011-12-08 NOTE — Progress Notes (Signed)
Pt. c/o excruciating, burning  pain to LLQ abdomen. She said pain is similar to the one she had in the ED a few days ago. PRN pain medication given as ordered.  Pt. Has multiple  loose BM today. Will continue to monitor. Lisbeth Ply RN

## 2011-12-08 NOTE — Discharge Summary (Signed)
Physician Discharge Summary  Patient ID: Karen Roberts MRN: [id number] DOB/AGE: January 14, 1971 41 y.o.  Admit date: 12/04/2011 Discharge date: 12/08/2011  Admission Diagnoses: Abdominal pain  Discharge Diagnoses:  Abdominal wall hematoma Small contained bile leak  Active Problems:  * No active hospital problems. *    Discharged Condition: good  Hospital Course: Readmitted on 5/5 for worsening abdominal pain.  CT scan showed stranding and inflammation in the right lateral abdominal wall.  Exam showed some bruising, so this probably represents some intramuscular hematoma.  She underwent a HIDA scan which showed a small amount of retained contrast, consistent with a small contained bile leak.  Labs were normal.  She was started on antibiotics and we found a pain medicine regimen that controlled her pain.  She was also severely constipated, but this was resolved with Miralax, but this caused some LLQ cramping.  Consults: None  Significant Diagnostic Studies: labs: LFT's WNL, radiology: CT scan: as above and nuclear medicine: HIDA scan as above  Treatments: antibiotics: Zosyn  Discharge Exam: Blood pressure 109/54, pulse 102, temperature 100.1 F (37.8 C), temperature source Oral, resp. rate 18, height 5\' 5"  (1.651 m), weight 212 lb (96.163 kg), last menstrual period 12/05/2011, SpO2 99.00%. Slight bruising to right lateral abdominal wall, mild tenderness to palpation  Disposition: 01-Home or Self Care  Discharge Orders    Future Appointments: Provider: Department: Dept Phone: Center:   12/13/2011 10:50 AM Wilmon Arms. Kenise Barraco, MD [facility] [phone] None     Future Orders Please Complete By Expires   Diet general      Increase activity slowly      May walk up steps      May shower / Bathe      Driving Restrictions      Comments:   Do not drive while taking pain medications   Call MD for:  temperature >100.4      Call MD for:  persistant nausea and vomiting      Call MD  for:  severe uncontrolled pain      Call MD for:  redness, tenderness, or signs of infection (pain, swelling, redness, odor or green/yellow discharge around incision site)        Medication List  As of 12/08/2011  8:40 AM   STOP taking these medications         oxyCODONE-acetaminophen 5-325 MG per tablet         TAKE these medications         amoxicillin-clavulanate 875-125 MG per tablet   Commonly known as: AUGMENTIN   Take 1 tablet by mouth every 12 (twelve) hours.      HYDROmorphone 2 MG tablet   Commonly known as: DILAUDID   Take 1 tablet (2 mg total) by mouth every 4 (four) hours as needed.      promethazine 12.5 MG tablet   Commonly known as: PHENERGAN   Take 12.5 mg by mouth every 6 (six) hours as needed. For nausea           Follow-up Information    Follow up with Wynona Luna., MD. Schedule an appointment as soon as possible for a visit in 2 weeks.   Contact information:   [employer] [address] [place] [address] [phone]          Signed: Wynona Luna. 12/08/2011, 8:40 AM

## 2011-12-08 NOTE — Telephone Encounter (Signed)
Karen Roberts knows her appt for u/s on 5-15 and do lab work the same day and come in to see Dr Corliss Skains on 12-16-11 @ 2:15pm and I also mailed copys of lab work and test with a map to the images places

## 2011-12-08 NOTE — Discharge Instructions (Signed)
My nurse will contact you to set up an ultrasound and some blood work next week. Call [phone] for fever over 101, worsening pain,redness at your incisions

## 2011-12-08 NOTE — Progress Notes (Signed)
  Subjective: Had several bowel movements last night.  Having some left sided cramping.  Right-sided abdominal soreness is better.  Tolerating PO's  Objective: Vital signs in last 24 hours: Temp:  [98.1 F (36.7 C)-100.1 F (37.8 C)] 100.1 F (37.8 C) (05/09 0642) Pulse Rate:  [87-107] 102  (05/09 0642) Resp:  [16-18] 18  (05/09 0642) BP: (108-109)/(54-57) 109/54 mmHg (05/09 0642) SpO2:  [95 %-99 %] 99 % (05/09 0642) Last BM Date: 12/07/11  Intake/Output from previous day:   Intake/Output this shift:    General appearance: alert, cooperative and no distress GI: soft, mild bruising, tenderness to right abdominal wall Incisions c/d/i  Lab Results:   Basename 12/06/11 0610  WBC 10.7*  HGB 11.1*  HCT 33.1*  PLT 426*   BMET  Basename 12/06/11 0610  NA 137  K 3.4*  CL 103  CO2 23  GLUCOSE 135*  BUN 3*  CREATININE 0.79  CALCIUM 8.9   PT/INR No results found for this basename: LABPROT:2,INR:2 in the last 72 hours ABG No results found for this basename: PHART:2,PCO2:2,PO2:2,HCO3:2 in the last 72 hours  Studies/Results: No results found.  Anti-infectives: Anti-infectives     Start     Dose/Rate Route Frequency Ordered Stop   12/07/11 1000   amoxicillin-clavulanate (AUGMENTIN) 875-125 MG per tablet 1 tablet        1 tablet Oral Every 12 hours 12/07/11 0756     12/04/11 2200   piperacillin-tazobactam (ZOSYN) IVPB 3.375 g  Status:  Discontinued        3.375 g 12.5 mL/hr over 240 Minutes Intravenous 3 times per day 12/04/11 1949 12/07/11 0756          Assessment/Plan: s/p * No surgery found * Discharge PO Dilaudid, Augmentin, PRN miralax   LOS: 4 days    Aja Bolander K. 12/08/2011

## 2011-12-12 ENCOUNTER — Emergency Department (HOSPITAL_COMMUNITY): Payer: 59

## 2011-12-12 ENCOUNTER — Inpatient Hospital Stay (HOSPITAL_COMMUNITY)
Admission: EM | Admit: 2011-12-12 | Discharge: 2011-12-19 | DRG: 395 | Disposition: A | Discharge status: Home / Self Care | Payer: 59 | Source: Ambulatory Visit | Attending: Surgery | Admitting: Surgery

## 2011-12-12 ENCOUNTER — Encounter (HOSPITAL_COMMUNITY): Payer: Self-pay | Admitting: *Deleted

## 2011-12-12 ENCOUNTER — Inpatient Hospital Stay (HOSPITAL_COMMUNITY): Payer: 59

## 2011-12-12 DIAGNOSIS — K929 Disease of digestive system, unspecified: Principal | ICD-10-CM | POA: Diagnosis present

## 2011-12-12 DIAGNOSIS — K9189 Other postprocedural complications and disorders of digestive system: Secondary | ICD-10-CM

## 2011-12-12 DIAGNOSIS — Z9089 Acquired absence of other organs: Secondary | ICD-10-CM

## 2011-12-12 DIAGNOSIS — K838 Other specified diseases of biliary tract: Secondary | ICD-10-CM | POA: Diagnosis present

## 2011-12-12 DIAGNOSIS — G8918 Other acute postprocedural pain: Secondary | ICD-10-CM

## 2011-12-12 DIAGNOSIS — F3289 Other specified depressive episodes: Secondary | ICD-10-CM | POA: Diagnosis present

## 2011-12-12 DIAGNOSIS — R52 Pain, unspecified: Secondary | ICD-10-CM | POA: Diagnosis present

## 2011-12-12 DIAGNOSIS — Z79899 Other long term (current) drug therapy: Secondary | ICD-10-CM

## 2011-12-12 DIAGNOSIS — K839 Disease of biliary tract, unspecified: Secondary | ICD-10-CM | POA: Diagnosis present

## 2011-12-12 DIAGNOSIS — D473 Essential (hemorrhagic) thrombocythemia: Secondary | ICD-10-CM | POA: Diagnosis present

## 2011-12-12 DIAGNOSIS — R109 Unspecified abdominal pain: Secondary | ICD-10-CM | POA: Diagnosis present

## 2011-12-12 DIAGNOSIS — D649 Anemia, unspecified: Secondary | ICD-10-CM | POA: Diagnosis present

## 2011-12-12 DIAGNOSIS — F411 Generalized anxiety disorder: Secondary | ICD-10-CM | POA: Diagnosis present

## 2011-12-12 DIAGNOSIS — Z9049 Acquired absence of other specified parts of digestive tract: Secondary | ICD-10-CM

## 2011-12-12 DIAGNOSIS — F329 Major depressive disorder, single episode, unspecified: Secondary | ICD-10-CM | POA: Diagnosis present

## 2011-12-12 DIAGNOSIS — Z882 Allergy status to sulfonamides status: Secondary | ICD-10-CM

## 2011-12-12 HISTORY — PX: OTHER SURGICAL HISTORY: SHX169

## 2011-12-12 HISTORY — DX: Other postprocedural complications and disorders of digestive system: K91.89

## 2011-12-12 HISTORY — DX: Other specified diseases of biliary tract: K83.8

## 2011-12-12 LAB — CBC
HCT: 33.1 % — ABNORMAL LOW (ref 36.0–46.0)
MCV: 88.7 fL (ref 78.0–100.0)
RDW: 13.5 % (ref 11.5–15.5)
WBC: 17.7 10*3/uL — ABNORMAL HIGH (ref 4.0–10.5)

## 2011-12-12 LAB — COMPREHENSIVE METABOLIC PANEL
BUN: 9 mg/dL (ref 6–23)
CO2: 25 mEq/L (ref 19–32)
Calcium: 9.7 mg/dL (ref 8.4–10.5)
Creatinine, Ser: 0.71 mg/dL (ref 0.50–1.10)
GFR calc Af Amer: >90 mL/min (ref >90)
GFR calc non Af Amer: >90 mL/min (ref >90)
Glucose, Bld: 130 mg/dL — ABNORMAL HIGH (ref 70–99)

## 2011-12-12 LAB — DIFFERENTIAL
Basophils Absolute: 0 10*3/uL (ref 0.0–0.1)
Eosinophils Relative: 3 % (ref 0–5)
Lymphocytes Relative: 9 % — ABNORMAL LOW (ref 12–46)
Lymphs Abs: 1.6 10*3/uL (ref 0.7–4.0)
Monocytes Absolute: 1.9 10*3/uL — ABNORMAL HIGH (ref 0.1–1.0)

## 2011-12-12 LAB — HCG, SERUM, QUALITATIVE: Preg, Serum: NEGATIVE

## 2011-12-12 LAB — LIPASE, BLOOD: Lipase: 32 U/L (ref 11–59)

## 2011-12-12 MED ORDER — MIDAZOLAM HCL 2 MG/2ML IJ SOLN
INTRAMUSCULAR | Status: AC
  Filled 2011-12-12: qty 4

## 2011-12-12 MED ORDER — DIPHENHYDRAMINE HCL 12.5 MG/5ML PO ELIX
12.5000 mg | ORAL_SOLUTION | Freq: Four times a day (QID) | ORAL | Status: DC | PRN
  Administered 2011-12-17: 25 mg via ORAL
  Filled 2011-12-12: qty 10

## 2011-12-12 MED ORDER — HYDROMORPHONE HCL PF 1 MG/ML IJ SOLN
1.0000 mg | Freq: Once | INTRAMUSCULAR | Status: AC
  Administered 2011-12-12: 1 mg via INTRAVENOUS
  Filled 2011-12-12: qty 1

## 2011-12-12 MED ORDER — HYDROMORPHONE HCL PF 1 MG/ML IJ SOLN
INTRAMUSCULAR | Status: AC
  Filled 2011-12-12: qty 1

## 2011-12-12 MED ORDER — KCL IN DEXTROSE-NACL 20-5-0.45 MEQ/L-%-% IV SOLN
INTRAVENOUS | Status: DC
  Administered 2011-12-12 – 2011-12-15 (×8): via INTRAVENOUS
  Filled 2011-12-12 (×15): qty 1000

## 2011-12-12 MED ORDER — HYDROMORPHONE HCL PF 1 MG/ML IJ SOLN
0.5000 mg | INTRAMUSCULAR | Status: DC | PRN
  Administered 2011-12-12 (×3): 2 mg via INTRAVENOUS
  Administered 2011-12-12: 1 mg via INTRAVENOUS
  Administered 2011-12-12: 2 mg via INTRAVENOUS
  Administered 2011-12-13: 1 mg via INTRAVENOUS
  Administered 2011-12-13 (×2): 2 mg via INTRAVENOUS
  Administered 2011-12-13 (×3): 1 mg via INTRAVENOUS
  Administered 2011-12-13 – 2011-12-14 (×5): 2 mg via INTRAVENOUS
  Administered 2011-12-14: 1 mg via INTRAVENOUS
  Administered 2011-12-14 (×2): 2 mg via INTRAVENOUS
  Administered 2011-12-14: 1 mg via INTRAVENOUS
  Administered 2011-12-14 – 2011-12-15 (×3): 2 mg via INTRAVENOUS
  Filled 2011-12-12: qty 2
  Filled 2011-12-12 (×2): qty 1
  Filled 2011-12-12 (×7): qty 2
  Filled 2011-12-12: qty 1
  Filled 2011-12-12: qty 2
  Filled 2011-12-12: qty 1
  Filled 2011-12-12: qty 2
  Filled 2011-12-12: qty 1
  Filled 2011-12-12 (×6): qty 2
  Filled 2011-12-12: qty 1
  Filled 2011-12-12 (×2): qty 2

## 2011-12-12 MED ORDER — MIDAZOLAM HCL 5 MG/5ML IJ SOLN
INTRAMUSCULAR | Status: AC | PRN
  Administered 2011-12-12: 1 mg via INTRAVENOUS
  Administered 2011-12-12: 2 mg via INTRAVENOUS

## 2011-12-12 MED ORDER — DIPHENHYDRAMINE HCL 50 MG/ML IJ SOLN
12.5000 mg | Freq: Four times a day (QID) | INTRAMUSCULAR | Status: DC | PRN
  Administered 2011-12-16: 25 mg via INTRAVENOUS
  Filled 2011-12-12: qty 1

## 2011-12-12 MED ORDER — PIPERACILLIN-TAZOBACTAM 3.375 G IVPB
3.3750 g | Freq: Three times a day (TID) | INTRAVENOUS | Status: DC
  Administered 2011-12-12 – 2011-12-16 (×10): 3.375 g via INTRAVENOUS
  Filled 2011-12-12 (×14): qty 50

## 2011-12-12 MED ORDER — FENTANYL CITRATE 0.05 MG/ML IJ SOLN
INTRAMUSCULAR | Status: AC | PRN
  Administered 2011-12-12 (×3): 50 ug via INTRAVENOUS
  Administered 2011-12-12 (×2): 25 ug via INTRAVENOUS

## 2011-12-12 MED ORDER — ONDANSETRON HCL 4 MG/2ML IJ SOLN
4.0000 mg | Freq: Four times a day (QID) | INTRAMUSCULAR | Status: DC | PRN
  Administered 2011-12-12 – 2011-12-16 (×9): 4 mg via INTRAVENOUS
  Filled 2011-12-12 (×9): qty 2

## 2011-12-12 MED ORDER — ONDANSETRON HCL 4 MG/2ML IJ SOLN
4.0000 mg | Freq: Once | INTRAMUSCULAR | Status: AC
  Administered 2011-12-12: 4 mg via INTRAVENOUS
  Filled 2011-12-12: qty 2

## 2011-12-12 MED ORDER — FENTANYL CITRATE 0.05 MG/ML IJ SOLN
INTRAMUSCULAR | Status: AC
  Filled 2011-12-12: qty 4

## 2011-12-12 MED ORDER — PIPERACILLIN-TAZOBACTAM 3.375 G IVPB 30 MIN
3.3750 g | Freq: Once | INTRAVENOUS | Status: AC
  Administered 2011-12-12: 3.375 g via INTRAVENOUS

## 2011-12-12 MED ORDER — PANTOPRAZOLE SODIUM 40 MG IV SOLR
40.0000 mg | Freq: Every day | INTRAVENOUS | Status: DC
  Administered 2011-12-12 – 2011-12-15 (×4): 40 mg via INTRAVENOUS
  Filled 2011-12-12 (×5): qty 40

## 2011-12-12 MED ORDER — HYDROMORPHONE HCL PF 1 MG/ML IJ SOLN
INTRAMUSCULAR | Status: AC | PRN
  Administered 2011-12-12: 1 mg

## 2011-12-12 MED ORDER — SODIUM CHLORIDE 0.9 % IV BOLUS (SEPSIS)
1000.0000 mL | Freq: Once | INTRAVENOUS | Status: AC
  Administered 2011-12-12: 1000 mL via INTRAVENOUS

## 2011-12-12 NOTE — Procedures (Signed)
12 Fr. Biloma drain Comp none

## 2011-12-12 NOTE — ED Notes (Signed)
Pt states gallbladder was removed 2 weeks ago and she has returned once with an abscess and was admitted.  Pt now returns with right sided abdominal pain.

## 2011-12-12 NOTE — ED Notes (Signed)
Pt rated pain 6 out 10

## 2011-12-12 NOTE — ED Notes (Signed)
Dr. Bonnielee Haff discussed with pt appropriate drain placement and technique- informed consent verbally modified with 2 RN witness,  Lucrezia Europe and Cyndia Skeeters, rn

## 2011-12-12 NOTE — ED Notes (Signed)
Pt states abdominal pain since yesterday. Started on left side then this morning up an down right side. Pt tearful.

## 2011-12-12 NOTE — ED Provider Notes (Signed)
History     CSN: [id number]  Arrival date & time 12/12/11  2130   First MD Initiated Contact with Patient 12/12/11 [phone]      Chief Complaint  Patient presents with  . Abdominal Pain    (Consider location/radiation/quality/duration/timing/severity/associated sxs/prior treatment) HPI Pt seen and admitted for what was thought to be biliary leak s/p lap chole earlier this month. No active leak and was started on augmentin and d/c home. F/u US and visit with her surgeon tomorrow. Pt states her burning RUQ pain began yesterday associated with nausea. Eased off and then worsened this morning. Unable to tolerate PO's. No fever, chills. Pt states this is the same pain admitted earlier with.  Past Medical History  Diagnosis Date  . Allergy   . Anxiety   . PONV (postoperative nausea and vomiting)   . Ligament tear     left  . Headache     "every once in awhile"    Past Surgical History  Procedure Date  . Cesarean section 2000  . Appendectomy 2011  . Laparoscopic cholecystectomy 11/29/11    w/IOC  . Cholecystectomy 11/29/2011    Procedure: LAPAROSCOPIC CHOLECYSTECTOMY WITH INTRAOPERATIVE CHOLANGIOGRAM;  Surgeon: Wilmon Arms. Corliss Skains, MD;  Location: [facility] OR;  Service: General;  Laterality: N/A;    Family History  Problem Relation Age of Onset  . Cancer Maternal Grandmother     lung/brain  . Anesthesia problems Neg Hx     History  Substance Use Topics  . Smoking status: Never Smoker   . Smokeless tobacco: Never Used  . Alcohol Use: Yes     11/29/11 "maybe once/ monthly [place] iced teas"    OB History    Grav Para Term Preterm Abortions TAB SAB Ect Mult Living                  Review of Systems  Constitutional: Negative for fever and chills.  Respiratory: Negative for shortness of breath.   Cardiovascular: Negative for palpitations and leg swelling.  Gastrointestinal: Positive for nausea and abdominal pain. Negative for vomiting, diarrhea and constipation.  Genitourinary:  Negative for dysuria and flank pain.  Musculoskeletal: Negative for back pain.  Skin: Negative for rash.  Neurological: Negative for weakness and numbness.    Allergies  Ciprofloxacin  Home Medications   Current Outpatient Rx  Name Route Sig Dispense Refill  . AMOXICILLIN-POT CLAVULANATE 875-125 MG PO TABS Oral Take 1 tablet by mouth every 12 (twelve) hours. Take for 10 days. First dose 12/08/2011.    Marland Kitchen HYDROMORPHONE HCL 2 MG PO TABS Oral Take 2 mg by mouth every 4 (four) hours as needed. For pain.    Marland Kitchen PROMETHAZINE HCL 12.5 MG PO TABS Oral Take 12.5 mg by mouth every 6 (six) hours as needed. For nausea      BP 110/64  Pulse 100  Temp(Src) 97.5 F (36.4 C) (Oral)  Resp 18  SpO2 97%  LMP 12/05/2011  Physical Exam  Nursing note and vitals reviewed. Constitutional: She is oriented to person, place, and time. She appears well-developed and well-nourished. No distress.  HENT:  Head: Normocephalic and atraumatic.  Mouth/Throat: Oropharynx is clear and moist.  Eyes: EOM are normal. Pupils are equal, round, and reactive to light.  Neck: Normal range of motion. Neck supple.  Cardiovascular: Normal rate and regular rhythm.   Pulmonary/Chest: Effort normal and breath sounds normal. No respiratory distress. She has no wheezes. She has no rales.  Abdominal: Soft. Bowel sounds are normal.  There is tenderness (RUQ ttp without rebound or guarding). There is no rebound and no guarding.       Post op scars well healing without cellulitis  Musculoskeletal: Normal range of motion. She exhibits no edema and no tenderness.  Neurological: She is alert and oriented to person, place, and time.       5/5 motor in all ext, sensation intact  Skin: Skin is warm and dry. No rash noted. No erythema.  Psychiatric:       Anxious appearing    ED Course  Procedures (including critical care time)  Labs Reviewed  CBC - Abnormal; Notable for the following:    WBC 17.7 (*)    RBC 3.73 (*)    Hemoglobin  11.4 (*)    HCT 33.1 (*)    Platelets 713 (*)    All other components within normal limits  DIFFERENTIAL - Abnormal; Notable for the following:    Neutro Abs 13.7 (*)    Lymphocytes Relative 9 (*)    Monocytes Absolute 1.9 (*)    All other components within normal limits  COMPREHENSIVE METABOLIC PANEL - Abnormal; Notable for the following:    Sodium 134 (*)    Glucose, Bld 130 (*)    Albumin 3.0 (*)    Alkaline Phosphatase 173 (*)    Total Bilirubin 1.4 (*)    All other components within normal limits  LIPASE, BLOOD  HCG, SERUM, QUALITATIVE   US Abdomen Complete  12/12/2011  *RADIOLOGY REPORT*  Clinical Data:  Postop laparoscopic cholecystectomy.  Question bile leak.  Abdominal pain.  COMPLETE ABDOMINAL ULTRASOUND  Comparison:  CT 12/04/2011  Findings:  Gallbladder:  Prior cholecystectomy.  There is fluid within the gallbladder fossa as seen on prior CT.  Common bile duct:   Slightly prominent, 8 mm.  Liver:  No focal lesion.  Normal echotexture.  Small amount of fluid around the left hepatic lobe.  IVC:  Cannot visualize due to overlying bowel gas.  Pancreas:  Cannot visualize due to overlying bowel gas.  Spleen:  Within normal limits in size and echotexture.  Right Kidney:   Normal in size and parenchymal echogenicity.  No evidence of mass or hydronephrosis.  Left Kidney:  Normal in size and parenchymal echogenicity.  No evidence of mass or hydronephrosis.  Abdominal aorta:  No aneurysm identified.  IMPRESSION: Prior cholecystectomy.  Fluid is seen within the gallbladder fossa and adjacent to the left hepatic lobe.  Cannot exclude bile leak. May consider hepatobiliary scan for further evaluation.  Original Report Authenticated By: Cyndie Chime, M.D.     1. Post-operative pain       MDM          Loren Racer, MD 12/12/11 1149

## 2011-12-12 NOTE — ED Notes (Signed)
Time out  Attempted , MD questioned informed consent. Pending verification from MD of correct procedure.

## 2011-12-12 NOTE — Progress Notes (Signed)
ANTIBIOTIC CONSULT NOTE - INITIAL  Pharmacy Consult for Zosyn Indication: post-cholecystectomy abscess  Allergies  Allergen Reactions  . Ciprofloxacin Dermatitis    "dr's think it may have caused mastitis"    Patient Measurements:    Vital Signs: Temp: 97.5 F (36.4 C) (05/13 0703) Temp src: Oral (05/13 0703) BP: 110/64 mmHg (05/13 1130) Pulse Rate: 100  (05/13 1130) Intake/Output from previous day:   Intake/Output from this shift:    Labs:  Basename 12/12/11 0740  WBC 17.7*  HGB 11.4*  PLT 713*  LABCREA --  CREATININE 0.71   The CrCl is unknown because both a height and weight (above a minimum accepted value) are required for this calculation. No results found for this basename: VANCOTROUGH:2,VANCOPEAK:2,VANCORANDOM:2,GENTTROUGH:2,GENTPEAK:2,GENTRANDOM:2,TOBRATROUGH:2,TOBRAPEAK:2,TOBRARND:2,AMIKACINPEAK:2,AMIKACINTROU:2,AMIKACIN:2, in the last 72 hours   Microbiology: Recent Results (from the past 720 hour(s))  SURGICAL PCR SCREEN     Status: Normal   Collection Time   11/28/11 10:30 AM      Component Value Range Status Comment   MRSA, PCR NEGATIVE  NEGATIVE  Final    Staphylococcus aureus NEGATIVE  NEGATIVE  Final     Medical History: Past Medical History  Diagnosis Date  . Allergy   . Anxiety   . PONV (postoperative nausea and vomiting)   . Ligament tear     left  . Headache     "every once in awhile"    Medications:  Anti-infectives    None     Assessment: Post-cholecystectomy abscess:  To begin empiric antimicrobial therapy with Zosyn.  Her renal function is normal and she has no history of kidney disease.  Goal of Therapy:  Appropriate antimicrobial therapy  Plan:  Zosyn 3.375gm IV x 1 in ED, then q8h extended infusion. As no dosage adjustments are anticipated Pharmacy will sign off. Thank you for the consult.  Estella Husk, Pharm.D., BCPS Clinical Pharmacist  Pager [phone] 12/12/2011, 12:27 PM

## 2011-12-12 NOTE — H&P (Signed)
Karen Roberts is an 41 y.o. female.   Chief Complaint: Severe Right-sided abdominal pain HPI: 40 yo female s/p laparoscopic cholecystectomy with intraoperative cholangiogram on 11/29/11.  She had significant post-operative pain and was hospitalized for a couple of days.  She was discharged home on PO pain meds, but was readmitted on 12/04/11 for refractory pain.  A CT scan at that time showed some inflammation/ stranding in the right abdominal wall and subcutaneous tissues, as well as some fluid in the gallbladder fossa.  She then underwent a HIDA scan which showed a possible contained bile leak.  Her WBC normalized and she had normal liver function tests, so she was maintained on antibiotics.  She was discharged last week on Augmentin and PO Dilaudid.  The pain has worsened over the weekend, so she returned to the ED today.  Her WBC is elevated and her LFT's are abnormal.  Repeat ultrasound showed fluid in the gallbladder fossa and around the liver.  She is being admitted for antibiotics and percutaneous drainage of the possible biloma.  Past Medical History  Diagnosis Date  . Allergy   . Anxiety   . PONV (postoperative nausea and vomiting)   . Ligament tear     left; "all are torn"  . Headache     "every once in awhile"    Past Surgical History  Procedure Date  . Cesarean section 2000  . Appendectomy 2011  . Cholecystectomy 11/29/2011    Procedure: LAPAROSCOPIC CHOLECYSTECTOMY WITH INTRAOPERATIVE CHOLANGIOGRAM;  Surgeon: Wilmon Arms. Corliss Skains, MD;  Location: [facility] OR;  Service: General;  Laterality: N/A;  . Rlq abscess drain placement 12/12/11    Family History  Problem Relation Age of Onset  . Cancer Maternal Grandmother     lung/brain  . Anesthesia problems Neg Hx    Social History:  reports that she has never smoked. She has never used smokeless tobacco. She reports that she drinks alcohol. She reports that she does not use illicit drugs.  Allergies:  Allergies  Allergen Reactions  .  Ciprofloxacin Dermatitis    "dr's think it may have caused mastitis"    Medications Prior to Admission  Medication Sig Dispense Refill  . amoxicillin-clavulanate (AUGMENTIN) 875-125 MG per tablet Take 1 tablet by mouth every 12 (twelve) hours. Take for 10 days. First dose 12/08/2011.      Marland Kitchen HYDROmorphone (DILAUDID) 2 MG tablet Take 2 mg by mouth every 4 (four) hours as needed. For pain.      . promethazine (PHENERGAN) 12.5 MG tablet Take 12.5 mg by mouth every 6 (six) hours as needed. For nausea        Results for orders placed during the hospital encounter of 12/12/11 (from the past 48 hour(s))  CBC     Status: Abnormal   Collection Time   12/12/11  7:40 AM      Component Value Range Comment   WBC 17.7 (*) 4.0 - 10.5 (K/uL)    RBC 3.73 (*) 3.87 - 5.11 (MIL/uL)    Hemoglobin 11.4 (*) 12.0 - 15.0 (g/dL)    HCT 52.8 (*) 41.3 - 46.0 (%)    MCV 88.7  78.0 - 100.0 (fL)    MCH 30.6  26.0 - 34.0 (pg)    MCHC 34.4  30.0 - 36.0 (g/dL)    RDW 24.4  01.0 - 27.2 (%)    Platelets 713 (*) 150 - 400 (K/uL)   DIFFERENTIAL     Status: Abnormal   Collection Time  12/12/11  7:40 AM      Component Value Range Comment   Neutrophils Relative 77  43 - 77 (%)    Neutro Abs 13.7 (*) 1.7 - 7.7 (K/uL)    Lymphocytes Relative 9 (*) 12 - 46 (%)    Lymphs Abs 1.6  0.7 - 4.0 (K/uL)    Monocytes Relative 11  3 - 12 (%)    Monocytes Absolute 1.9 (*) 0.1 - 1.0 (K/uL)    Eosinophils Relative 3  0 - 5 (%)    Eosinophils Absolute 0.5  0.0 - 0.7 (K/uL)    Basophils Relative 0  0 - 1 (%)    Basophils Absolute 0.0  0.0 - 0.1 (K/uL)   COMPREHENSIVE METABOLIC PANEL     Status: Abnormal   Collection Time   12/12/11  7:40 AM      Component Value Range Comment   Sodium 134 (*) 135 - 145 (mEq/L)    Potassium 3.6  3.5 - 5.1 (mEq/L)    Chloride 97  96 - 112 (mEq/L)    CO2 25  19 - 32 (mEq/L)    Glucose, Bld 130 (*) 70 - 99 (mg/dL)    BUN 9  6 - 23 (mg/dL)    Creatinine, Ser 1.61  0.50 - 1.10 (mg/dL)    Calcium 9.7   8.4 - 10.5 (mg/dL)    Total Protein 7.6  6.0 - 8.3 (g/dL)    Albumin 3.0 (*) 3.5 - 5.2 (g/dL)    AST 22  0 - 37 (U/L)    ALT 24  0 - 35 (U/L)    Alkaline Phosphatase 173 (*) 39 - 117 (U/L)    Total Bilirubin 1.4 (*) 0.3 - 1.2 (mg/dL)    GFR calc non Af Amer >90  >90 (mL/min)    GFR calc Af Amer >90  >90 (mL/min)   LIPASE, BLOOD     Status: Normal   Collection Time   12/12/11  7:40 AM      Component Value Range Comment   Lipase 32  11 - 59 (U/L)   HCG, SERUM, QUALITATIVE     Status: Normal   Collection Time   12/12/11  7:40 AM      Component Value Range Comment   Preg, Serum NEGATIVE  NEGATIVE     US Abdomen Complete  12/12/2011  *RADIOLOGY REPORT*  Clinical Data:  Postop laparoscopic cholecystectomy.  Question bile leak.  Abdominal pain.  COMPLETE ABDOMINAL ULTRASOUND  Comparison:  CT 12/04/2011  Findings:  Gallbladder:  Prior cholecystectomy.  There is fluid within the gallbladder fossa as seen on prior CT.  Common bile duct:   Slightly prominent, 8 mm.  Liver:  No focal lesion.  Normal echotexture.  Small amount of fluid around the left hepatic lobe.  IVC:  Cannot visualize due to overlying bowel gas.  Pancreas:  Cannot visualize due to overlying bowel gas.  Spleen:  Within normal limits in size and echotexture.  Right Kidney:   Normal in size and parenchymal echogenicity.  No evidence of mass or hydronephrosis.  Left Kidney:  Normal in size and parenchymal echogenicity.  No evidence of mass or hydronephrosis.  Abdominal aorta:  No aneurysm identified.  IMPRESSION: Prior cholecystectomy.  Fluid is seen within the gallbladder fossa and adjacent to the left hepatic lobe.  Cannot exclude bile leak. May consider hepatobiliary scan for further evaluation.  Original Report Authenticated By: Cyndie Chime, M.D.    Review of Systems  Constitutional: Positive  for weight loss.  Gastrointestinal: Positive for nausea and abdominal pain.  Neurological: Positive for weakness.    Blood pressure  110/71, pulse 92, temperature 99.2 F (37.3 C), temperature source Oral, resp. rate 20, height 5\' 5"  (1.651 m), weight 213 lb 13.5 oz (97 kg), last menstrual period 12/05/2011, SpO2 99.00%. Physical Exam  WDWN in NAD HEENT:  EOMI, sclera anicteric Neck:  No masses, no thyromegaly Lungs:  CTA bilaterally; normal respiratory effort CV:  Regular rate and rhythm; no murmurs Abd:  +bowel sounds, incisions well-healed Tender along right abdominal wall, especially in RUQ. Ext:  Well-perfused; no edema Skin:  Warm, dry; no sign of jaundice  Assessment/Plan Imp: Persistent bile leak after laparoscopic cholecystectomy with IOC on 4/30  Plan:  Admit for IV antibiotics and percutaneous drainage of fluid collection by interventional radiology  Will likely need GI consult for possible ERCP/ stenting  Tyna Huertas K. 12/12/2011, 9:21 PM

## 2011-12-12 NOTE — H&P (Signed)
Karen Roberts is an 41 y.o. female.   Chief Complaint: Lap chole 11/29/11 Bile leak; biloma Scheduled now for RLQ abscess drain placement HPI: abd pain x few days  Past Medical History  Diagnosis Date  . Allergy   . Anxiety   . PONV (postoperative nausea and vomiting)   . Ligament tear     left  . Headache     "every once in awhile"    Past Surgical History  Procedure Date  . Cesarean section 2000  . Appendectomy 2011  . Laparoscopic cholecystectomy 11/29/11    w/IOC  . Cholecystectomy 11/29/2011    Procedure: LAPAROSCOPIC CHOLECYSTECTOMY WITH INTRAOPERATIVE CHOLANGIOGRAM;  Surgeon: Karen Roberts. Karen Skains, MD;  Location: [facility] OR;  Service: General;  Laterality: N/A;    Family History  Problem Relation Age of Onset  . Cancer Maternal Grandmother     lung/brain  . Anesthesia problems Neg Hx    Social History:  reports that she has never smoked. She has never used smokeless tobacco. She reports that she drinks alcohol. She reports that she does not use illicit drugs.  Allergies:  Allergies  Allergen Reactions  . Ciprofloxacin Dermatitis    "dr's think it may have caused mastitis"     (Not in a hospital admission)  Results for orders placed during the hospital encounter of 12/12/11 (from the past 48 hour(s))  CBC     Status: Abnormal   Collection Time   12/12/11  7:40 AM      Component Value Range Comment   WBC 17.7 (*) 4.0 - 10.5 (K/uL)    RBC 3.73 (*) 3.87 - 5.11 (MIL/uL)    Hemoglobin 11.4 (*) 12.0 - 15.0 (g/dL)    HCT 16.1 (*) 09.6 - 46.0 (%)    MCV 88.7  78.0 - 100.0 (fL)    MCH 30.6  26.0 - 34.0 (pg)    MCHC 34.4  30.0 - 36.0 (g/dL)    RDW 04.5  40.9 - 81.1 (%)    Platelets 713 (*) 150 - 400 (K/uL)   DIFFERENTIAL     Status: Abnormal   Collection Time   12/12/11  7:40 AM      Component Value Range Comment   Neutrophils Relative 77  43 - 77 (%)    Neutro Abs 13.7 (*) 1.7 - 7.7 (K/uL)    Lymphocytes Relative 9 (*) 12 - 46 (%)    Lymphs Abs 1.6  0.7 - 4.0 (K/uL)     Monocytes Relative 11  3 - 12 (%)    Monocytes Absolute 1.9 (*) 0.1 - 1.0 (K/uL)    Eosinophils Relative 3  0 - 5 (%)    Eosinophils Absolute 0.5  0.0 - 0.7 (K/uL)    Basophils Relative 0  0 - 1 (%)    Basophils Absolute 0.0  0.0 - 0.1 (K/uL)   COMPREHENSIVE METABOLIC PANEL     Status: Abnormal   Collection Time   12/12/11  7:40 AM      Component Value Range Comment   Sodium 134 (*) 135 - 145 (mEq/L)    Potassium 3.6  3.5 - 5.1 (mEq/L)    Chloride 97  96 - 112 (mEq/L)    CO2 25  19 - 32 (mEq/L)    Glucose, Bld 130 (*) 70 - 99 (mg/dL)    BUN 9  6 - 23 (mg/dL)    Creatinine, Ser 9.14  0.50 - 1.10 (mg/dL)    Calcium 9.7  8.4 - 10.5 (  mg/dL)    Total Protein 7.6  6.0 - 8.3 (g/dL)    Albumin 3.0 (*) 3.5 - 5.2 (g/dL)    AST 22  0 - 37 (U/L)    ALT 24  0 - 35 (U/L)    Alkaline Phosphatase 173 (*) 39 - 117 (U/L)    Total Bilirubin 1.4 (*) 0.3 - 1.2 (mg/dL)    GFR calc non Af Amer >90  >90 (mL/min)    GFR calc Af Amer >90  >90 (mL/min)   LIPASE, BLOOD     Status: Normal   Collection Time   12/12/11  7:40 AM      Component Value Range Comment   Lipase 32  11 - 59 (U/L)   HCG, SERUM, QUALITATIVE     Status: Normal   Collection Time   12/12/11  7:40 AM      Component Value Range Comment   Preg, Serum NEGATIVE  NEGATIVE     US Abdomen Complete  12/12/2011  *RADIOLOGY REPORT*  Clinical Data:  Postop laparoscopic cholecystectomy.  Question bile leak.  Abdominal pain.  COMPLETE ABDOMINAL ULTRASOUND  Comparison:  CT 12/04/2011  Findings:  Gallbladder:  Prior cholecystectomy.  There is fluid within the gallbladder fossa as seen on prior CT.  Common bile duct:   Slightly prominent, 8 mm.  Liver:  No focal lesion.  Normal echotexture.  Small amount of fluid around the left hepatic lobe.  IVC:  Cannot visualize due to overlying bowel gas.  Pancreas:  Cannot visualize due to overlying bowel gas.  Spleen:  Within normal limits in size and echotexture.  Right Kidney:   Normal in size and parenchymal  echogenicity.  No evidence of mass or hydronephrosis.  Left Kidney:  Normal in size and parenchymal echogenicity.  No evidence of mass or hydronephrosis.  Abdominal aorta:  No aneurysm identified.  IMPRESSION: Prior cholecystectomy.  Fluid is seen within the gallbladder fossa and adjacent to the left hepatic lobe.  Cannot exclude bile leak. May consider hepatobiliary scan for further evaluation.  Original Report Authenticated By: Karen Roberts, M.D.    Review of Systems  Constitutional: Negative for fever.  Gastrointestinal: Positive for nausea, vomiting and abdominal pain.  Musculoskeletal: Positive for joint pain.  Neurological: Positive for headaches.    Blood pressure 110/64, pulse 100, temperature 97.5 F (36.4 C), temperature source Oral, resp. rate 18, last menstrual period 12/05/2011, SpO2 97.00%. Physical Exam  Constitutional: She is oriented to person, place, and time. She appears well-developed and well-nourished.  Cardiovascular: Normal rate, regular rhythm and normal heart sounds.   No murmur heard. Respiratory: Effort normal and breath sounds normal. She has no wheezes.  GI: Soft. There is tenderness.       RLQ and most of low abd  Neurological: She is alert and oriented to person, place, and time.  Skin: Skin is warm.  Psychiatric: She has a normal mood and affect. Her behavior is normal. Judgment and thought content normal.     Assessment/Plan Abd pain; lap chole 11/29/11 +Biloma Scheduled now for RLQ abscess drain placement Pt aware of procedure benefits and risks and agreeable to proceed. Consent signed.  Karen Roberts A 12/12/2011, 12:41 PM

## 2011-12-12 NOTE — ED Notes (Signed)
Pt c/o pain 8 out 10 pain scale- Dilaudid 1mg  IV given

## 2011-12-12 NOTE — ED Notes (Signed)
Per pt request called [employer] to notify them that her daughter, Karen Roberts, is absent today because she is here with her mother.  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

## 2011-12-13 ENCOUNTER — Telehealth (INDEPENDENT_AMBULATORY_CARE_PROVIDER_SITE_OTHER): Payer: Self-pay | Admitting: General Surgery

## 2011-12-13 ENCOUNTER — Encounter (INDEPENDENT_AMBULATORY_CARE_PROVIDER_SITE_OTHER): Payer: 59 | Admitting: Surgery

## 2011-12-13 ENCOUNTER — Encounter (HOSPITAL_COMMUNITY): Payer: Self-pay | Admitting: Internal Medicine

## 2011-12-13 DIAGNOSIS — K929 Disease of digestive system, unspecified: Principal | ICD-10-CM

## 2011-12-13 DIAGNOSIS — K838 Other specified diseases of biliary tract: Secondary | ICD-10-CM | POA: Diagnosis present

## 2011-12-13 LAB — COMPREHENSIVE METABOLIC PANEL
AST: 18 U/L (ref 0–37)
Albumin: 2.5 g/dL — ABNORMAL LOW (ref 3.5–5.2)
Alkaline Phosphatase: 163 U/L — ABNORMAL HIGH (ref 39–117)
BUN: 6 mg/dL (ref 6–23)
CO2: 26 mEq/L (ref 19–32)
Chloride: 99 mEq/L (ref 96–112)
Creatinine, Ser: 0.74 mg/dL (ref 0.50–1.10)
GFR calc non Af Amer: >90 mL/min (ref >90)
Potassium: 3.8 mEq/L (ref 3.5–5.1)
Total Bilirubin: 1 mg/dL (ref 0.3–1.2)

## 2011-12-13 LAB — CBC
MCH: 29.3 pg (ref 26.0–34.0)
MCV: 90.7 fL (ref 78.0–100.0)
Platelets: 756 10*3/uL — ABNORMAL HIGH (ref 150–400)
RBC: 3.35 MIL/uL — ABNORMAL LOW (ref 3.87–5.11)
RDW: 13.9 % (ref 11.5–15.5)
WBC: 13.6 10*3/uL — ABNORMAL HIGH (ref 4.0–10.5)

## 2011-12-13 NOTE — Progress Notes (Signed)
Nutrition Brief Note:  Pt determined to be at nutrition risk via health hx screen; pt unsure whether she has had wt loss.    Wt Readings from Last 10 Encounters:  12/12/11 213 lb 13.5 oz (97 kg)  12/05/11 212 lb (96.163 kg)  12/01/11 212 lb 11.9 oz (96.5 kg)  12/01/11 212 lb 11.9 oz (96.5 kg)  09/01/11 212 lb 12.8 oz (96.525 kg)  03/10/11 217 lb (98.431 kg)  02/24/11 218 lb (98.884 kg)  02/17/11 217 lb (98.431 kg)   Pt has had <2% wt changes in approximately 10 months.  No recent wt loss despite several admissions.  PO intake 50-75% during recent admissions, currently NPO for possible ERCP/stenting/GI consult.    RD will continue to monitor for nutrition risk and diet advancement with adequate intake.  Hoyt Koch Pager: [phone]

## 2011-12-13 NOTE — Progress Notes (Signed)
Subjective: Pt ok, feeling better overall today. Pain much improved.   Objective: Physical Exam: BP 99/56  Pulse 100  Temp(Src) 97.7 F (36.5 C) (Oral)  Resp 19  Ht 5\' 5"  (1.651 m)  Wt 213 lb 13.5 oz (97 kg)  BMI 35.59 kg/m2  SpO2 96%  LMP 12/05/2011 Drain intact, NT. bilious output, no pus. output recorded yesterday, at least in bag now.   Labs: CBC  Basename 12/13/11 0600 12/12/11 0740  WBC 13.6* 17.7*  HGB 9.8* 11.4*  HCT 30.4* 33.1*  PLT 756* 713*   BMET  Basename 12/13/11 0600 12/12/11 0740  NA 136 134*  K 3.8 3.6  CL 99 97  CO2 26 25  GLUCOSE 104* 130*  BUN 6 9  CREATININE 0.74 0.71  CALCIUM 8.8 9.7   LFT  Basename 12/13/11 0600 12/12/11 0740  PROT 6.4 --  ALBUMIN 2.5* --  AST 18 --  ALT 20 --  ALKPHOS 163* --  BILITOT 1.0 --  BILIDIR -- --  IBILI -- --  LIPASE -- 32   PT/INR No results found for this basename: LABPROT:2,INR:2 in the last 72 hours   Studies/Results: US Abdomen Complete  12/12/2011  *RADIOLOGY REPORT*  Clinical Data:  Postop laparoscopic cholecystectomy.  Question bile leak.  Abdominal pain.  COMPLETE ABDOMINAL ULTRASOUND  Comparison:  CT 12/04/2011  Findings:  Gallbladder:  Prior cholecystectomy.  There is fluid within the gallbladder fossa as seen on prior CT.  Common bile duct:   Slightly prominent, 8 mm.  Liver:  No focal lesion.  Normal echotexture.  Small amount of fluid around the left hepatic lobe.  IVC:  Cannot visualize due to overlying bowel gas.  Pancreas:  Cannot visualize due to overlying bowel gas.  Spleen:  Within normal limits in size and echotexture.  Right Kidney:   Normal in size and parenchymal echogenicity.  No evidence of mass or hydronephrosis.  Left Kidney:  Normal in size and parenchymal echogenicity.  No evidence of mass or hydronephrosis.  Abdominal aorta:  No aneurysm identified.  IMPRESSION: Prior cholecystectomy.  Fluid is seen within the gallbladder fossa and adjacent to the left hepatic  lobe.  Cannot exclude bile leak. May consider hepatobiliary scan for further evaluation.  Original Report Authenticated By: Cyndie Chime, M.D.    Assessment/Plan: Biloma s/p perc drain- functioning well WBC down. Plans noted for GI/ERCP    LOS: 1 day    Brayton El PA-C 12/13/2011 9:39 AM

## 2011-12-13 NOTE — Telephone Encounter (Signed)
Victorino Dike, at [facility], calling to let us know she is cancelling her ultrasound for tomorrow, secondary to pt is in the hospital at this time.

## 2011-12-13 NOTE — Progress Notes (Signed)
  Subjective: Feels noticeably better today - less right-sided tenderness No nausea   Objective: Vital signs in last 24 hours: Temp:  [97.7 F (36.5 C)-100 F (37.8 C)] 97.7 F (36.5 C) (05/14 0517) Pulse Rate:  [92-130] 100  (05/14 0517) Resp:  [17-43] 19  (05/14 0517) BP: (93-120)/(52-71) 99/56 mmHg (05/14 0517) SpO2:  [84 %-100 %] 96 % (05/14 0517) Weight:  [213 lb 13.5 oz (97 kg)] 213 lb 13.5 oz (97 kg) (05/13 1709) Last BM Date: 12/12/11  Intake/Output from previous day: 05/13 0701 - 05/14 0700 In: 2349.6 [P.O.:360; I.V.:1889.6; IV Piggyback:100] Out: 1000 [Urine:700; Drains:300] Intake/Output this shift:    General appearance: alert, cooperative and no distress GI: soft; much less tender in RUQ; no guarding Incisions - c/d/i  Lab Results:   Basename 12/13/11 0600 12/12/11 0740  WBC 13.6* 17.7*  HGB 9.8* 11.4*  HCT 30.4* 33.1*  PLT 756* 713*   BMET  Basename 12/13/11 0600 12/12/11 0740  NA 136 134*  K 3.8 3.6  CL 99 97  CO2 26 25  GLUCOSE 104* 130*  BUN 6 9  CREATININE 0.74 0.71  CALCIUM 8.8 9.7   PT/INR No results found for this basename: LABPROT:2,INR:2 in the last 72 hours ABG No results found for this basename: PHART:2,PCO2:2,PO2:2,HCO3:2 in the last 72 hours  Studies/Results: US Abdomen Complete  12/12/2011  *RADIOLOGY REPORT*  Clinical Data:  Postop laparoscopic cholecystectomy.  Question bile leak.  Abdominal pain.  COMPLETE ABDOMINAL ULTRASOUND  Comparison:  CT 12/04/2011  Findings:  Gallbladder:  Prior cholecystectomy.  There is fluid within the gallbladder fossa as seen on prior CT.  Common bile duct:   Slightly prominent, 8 mm.  Liver:  No focal lesion.  Normal echotexture.  Small amount of fluid around the left hepatic lobe.  IVC:  Cannot visualize due to overlying bowel gas.  Pancreas:  Cannot visualize due to overlying bowel gas.  Spleen:  Within normal limits in size and echotexture.  Right Kidney:   Normal in size and parenchymal  echogenicity.  No evidence of mass or hydronephrosis.  Left Kidney:  Normal in size and parenchymal echogenicity.  No evidence of mass or hydronephrosis.  Abdominal aorta:  No aneurysm identified.  IMPRESSION: Prior cholecystectomy.  Fluid is seen within the gallbladder fossa and adjacent to the left hepatic lobe.  Cannot exclude bile leak. May consider hepatobiliary scan for further evaluation.  Original Report Authenticated By: Cyndie Chime, M.D.    Anti-infectives: Anti-infectives     Start     Dose/Rate Route Frequency Ordered Stop   12/12/11 2200   piperacillin-tazobactam (ZOSYN) IVPB 3.375 g        3.375 g 12.5 mL/hr over 240 Minutes Intravenous 3 times per day 12/12/11 1441     12/12/11 1300   piperacillin-tazobactam (ZOSYN) IVPB 3.375 g        3.375 g 100 mL/hr over 30 Minutes Intravenous  Once 12/12/11 1228 12/12/11 1553          Assessment/Plan: s/p * No surgery found * Imp - post-cholecystectomy with bile leak s/p perc drain Will consult [facility] today for possible ERCP with stent placement Monitor hemoglobin  LOS: 1 day    Prue Lingenfelter K. 12/13/2011

## 2011-12-13 NOTE — Consult Note (Signed)
[facility] Consult: 11:30 AM 12/13/2011   Referring Provider: Manus Rudd, MD Primary Care Physician:  Carollee Herter, MD, MD Primary Gastroenterologist: none   Reason for Consultation:  Bile leak  HPI: Karen Roberts is a 41 y.o. female.  Had lap chole 11/29/11.  Went home day 3 post op.  Readmitted 5/5 for abdominal pain refractory to home narcotics, and nausea/anorexia. CT last week with stranding in right abd wall/subq tissue, fluid in GB fossa.  HIDA showed possible bile leak,  WBCs normalized with abx and discharged on Augmentin on Thursday.   Readmitted with ongoing pain.  WBCs again elevated.  A perc drain was placed yesterday and drains copiously.   Dr Corliss Skains wants her to hve ERCP/stent placement.   She is more comfortable but still has pain, following drainage.     Past Medical History  Diagnosis Date  . Allergy   . Anxiety   . PONV (postoperative nausea and vomiting)   . Ligament tear     left; "all are torn"  . Headache     "every once in awhile"    Past Surgical History  Procedure Date  . Cesarean section 2000  . Appendectomy 2011  . Cholecystectomy 11/29/2011    Procedure: LAPAROSCOPIC CHOLECYSTECTOMY WITH INTRAOPERATIVE CHOLANGIOGRAM;  Surgeon: Wilmon Arms. Corliss Skains, MD;  Location: [facility] OR;  Service: General;  Laterality: N/A;  . Rlq abscess drain placement 12/12/11    Prior to Admission medications   Medication Sig Start Date End Date Taking? Authorizing Provider  amoxicillin-clavulanate (AUGMENTIN) 875-125 MG per tablet Take 1 tablet by mouth every 12 (twelve) hours. Take for 10 days. First dose 12/08/2011.   Yes Historical Provider, MD  HYDROmorphone (DILAUDID) 2 MG tablet Take 2 mg by mouth every 4 (four) hours as needed. For pain.   Yes Historical Provider, MD  promethazine (PHENERGAN) 12.5 MG tablet Take 12.5 mg by mouth every 6 (six) hours as needed. For nausea   Yes Historical Provider, MD    Scheduled Meds:     . fentaNYL      . pantoprazole (PROTONIX) IV  40 mg Intravenous QHS  . piperacillin-tazobactam  3.375 g Intravenous Once  . piperacillin-tazobactam (ZOSYN)  IV  3.375 g Intravenous Q8H   Infusions:    . dextrose 5 % and 0.45 % NaCl with KCl 20 mEq/L 125 mL/hr at 12/13/11 0138   PRN Meds: diphenhydrAMINE, diphenhydrAMINE, fentaNYL, HYDROmorphone (DILAUDID) injection, HYDROmorphone, midazolam, ondansetron   Allergies as of 12/12/2011 - Review Complete 12/12/2011  Allergen Reaction Noted  . Ciprofloxacin Dermatitis 11/29/2011    Family History  Problem Relation Age of Onset  . Cancer Maternal Grandmother     lung/brain  . Anesthesia problems Neg Hx     History   Social History  . Marital Status: Married    Spouse Name: N/A    Number of Children: N/A  . Years of Education: N/A   Occupational History  . Not on file.   Social History Main Topics  . Smoking status: Never Smoker   . Smokeless tobacco: Never Used  . Alcohol Use: Yes     12/12/11 "maybe once/ monthly [place] iced teas"  . Drug Use: No  . Sexually Active: Yes   Other Topics Concern  . Not on file   Social History Narrative  . No narrative on file    REVIEW OF SYSTEMS: 14 systems reviewed PertinentS:  No prior GI issues.  Significant anxiety.  No NSAIds.  No ETOH or smoking.  No weight loss or gain.  No itching.  Urine is dark. No  Prior egd or colonoscopy.   No transfusions or hx anemia  PHYSICAL EXAM: Vital signs in last 24 hours: Temp:  [97.7 F (36.5 C)-100 F (37.8 C)] 98.7 F (37.1 C) (05/14 1053) Pulse Rate:  [92-130] 110  (05/14 1053) Resp:  [17-43] 21  (05/14 1053) BP: (93-120)/(52-71) 119/62 mmHg (05/14 1053) SpO2:  [84 %-100 %] 93 % (05/14 1053) Weight:  [213 lb 13.5 oz (97 kg)] 213 lb 13.5 oz (97 kg) (05/13 1709)  General: looks mildly unwell, anxious Head:  No trauma or assymetry  Eyes:  No icterus Ears:  Not HOH  Nose:  No discharge or bleedin Mouth:  Moist, clear  MM Neck:  No JVD, no mass Lungs:  Clear B Heart: RR, slight tachy no MRG Abdomen:  Soft very tender and apprehensive with palpation at RUQ.  Drain in RUQ with golden, clear bile.   Rectal: not done   Musc/Skeltl: no joint swelling Extremities:  No pedal edema  Neurologic:  Pleasant, .  No tremor.  Moves all 4s Skin:  No rash, itching, jaundice Tattoos:  None seen  Psych:  Pleasant, cooperative, worried.   Intake/Output from previous day: 05/13 0701 - 05/14 0700 In: 2349.6 [P.O.:360; I.V.:1889.6; IV Piggyback:100] Out: 1000 [Urine:700; Drains:300] Intake/Output this shift: Total I/O In: -  Out: 200 [Urine:200]  LAB RESULTS:  Basename 12/13/11 0600 12/12/11 0740  WBC 13.6* 17.7*  HGB 9.8* 11.4*  HCT 30.4* 33.1*  PLT 756* 713*   BMET Lab Results  Component Value Date   NA 136 12/13/2011   NA 134* 12/12/2011   NA 137 12/06/2011   K 3.8 12/13/2011   K 3.6 12/12/2011   K 3.4* 12/06/2011   CL 99 12/13/2011   CL 97 12/12/2011   CL 103 12/06/2011   CO2 26 12/13/2011   CO2 25 12/12/2011   CO2 23 12/06/2011   GLUCOSE 104* 12/13/2011   GLUCOSE 130* 12/12/2011   GLUCOSE 135* 12/06/2011   BUN 6 12/13/2011   BUN 9 12/12/2011   BUN 3* 12/06/2011   CREATININE 0.74 12/13/2011   CREATININE 0.71 12/12/2011   CREATININE 0.79 12/06/2011   CALCIUM 8.8 12/13/2011   CALCIUM 9.7 12/12/2011   CALCIUM 8.9 12/06/2011   LFT  Basename 12/13/11 0600 12/12/11 0740  PROT 6.4 7.6  ALBUMIN 2.5* 3.0*  AST 18 22  ALT 20 24  ALKPHOS 163* 173*  BILITOT 1.0 1.4*  BILIDIR -- --  IBILI -- --      Drugs of Abuse     RADIOLOGY STUDIES: Ct Guided Abscess Drain  12/13/2011  *RADIOLOGY REPORT*  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  Total Moderate Sedation Time: 20 minutes.  Procedure: The procedure, risks, benefits, and alternatives were explained to the patient. Questions regarding the procedure were encouraged  and answered. The patient understands and consents to the procedure.  The epigastrium was prepped with betadine in a sterile fashion, and a sterile drape was applied covering the operative field. A sterile gown and sterile gloves were used for the procedure.  Under CT guidance, an 18 gauge needle was inserted into the gallbladder fossa myeloma and removed over an Amplatz.  A 12-French dilator and 12-French multipurpose drain was inserted and coiled in the gallbladder fossa.  It was looped and string fixed then sewn to the skin.  Bile  was aspirated.  Findings: Imaging demonstrates biloma drain placement in the gallbladder fossa.  Complications: None.  Original Report Authenticated By: Donavan Burnet, M.D. IMPRESSION: Successful 12-French gallbladder fossa of biloma drain placement.    US Abdomen Complete  12/12/2011  *RADIOLOGY REPORT*  Clinical Data:  Postop laparoscopic cholecystectomy.  Question bile leak.  Abdominal pain.  COMPLETE ABDOMINAL ULTRASOUND  Comparison:  CT 12/04/2011  Findings:  Gallbladder:  Prior cholecystectomy.  There is fluid within the gallbladder fossa as seen on prior CT.  Common bile duct:   Slightly prominent, 8 mm.  Liver:  No focal lesion.  Normal echotexture.  Small amount of fluid around the left hepatic lobe.  IVC:  Cannot visualize due to overlying bowel gas.  Pancreas:  Cannot visualize due to overlying bowel gas.  Spleen:  Within normal limits in size and echotexture.  Right Kidney:   Normal in size and parenchymal echogenicity.  No evidence of mass or hydronephrosis.  Left Kidney:  Normal in size and parenchymal echogenicity.  No evidence of mass or hydronephrosis.  Abdominal aorta:  No aneurysm identified.  IMPRESSION: Prior cholecystectomy.  Fluid is seen within the gallbladder fossa and adjacent to the left hepatic lobe.  Cannot exclude bile leak. May consider hepatobiliary scan for further evaluation.  Original Report Authenticated By: Cyndie Chime, M.D.    ENDOSCOPIC  STUDIES: None ever.  IMPRESSION: *  Bile leak S/p biliary drain placement 5/13.  On Zosyn *  Lap chole 11/29/11   PLAN: Ercp to be arranged in OR, propably tomorrow    LOS: 1 day   Jennye Moccasin  12/13/2011, 11:30 AM Pager: [phone]  [facility] Attending  I have also seen and assessed the patient and agree with the above note.  She has a post-operative bile leak and ERCP with biliary stent placement is appropriate. I have explained risks benefits and alternatives and she understands and agrees to proceed. All questions answered.  Iva Boop, MD, [facility] [phone] (pager) 12/13/2011 5:57 PM

## 2011-12-14 ENCOUNTER — Encounter (HOSPITAL_COMMUNITY): Payer: Self-pay | Admitting: Anesthesiology

## 2011-12-14 ENCOUNTER — Other Ambulatory Visit: Payer: 59

## 2011-12-14 ENCOUNTER — Encounter (HOSPITAL_COMMUNITY)
Admission: EM | Disposition: A | Discharge status: Home / Self Care | Payer: Self-pay | Source: Ambulatory Visit | Attending: Surgery

## 2011-12-14 ENCOUNTER — Inpatient Hospital Stay (HOSPITAL_COMMUNITY): Payer: 59 | Admitting: Anesthesiology

## 2011-12-14 ENCOUNTER — Inpatient Hospital Stay (HOSPITAL_COMMUNITY): Payer: 59

## 2011-12-14 HISTORY — PX: ERCP: SHX5425

## 2011-12-14 LAB — CBC
HCT: 29.7 % — ABNORMAL LOW (ref 36.0–46.0)
Hemoglobin: 9.7 g/dL — ABNORMAL LOW (ref 12.0–15.0)
MCH: 29.3 pg (ref 26.0–34.0)
MCHC: 32.5 g/dL (ref 30.0–36.0)
MCV: 89.7 fL (ref 78.0–100.0)
Platelets: 925 10*3/uL (ref 150–400)
RBC: 3.31 MIL/uL — ABNORMAL LOW (ref 3.87–5.11)
RDW: 14.1 % (ref 11.5–15.5)
WBC: 9.5 10*3/uL (ref 4.0–10.5)

## 2011-12-14 LAB — COMPREHENSIVE METABOLIC PANEL
ALT: 15 U/L (ref 0–35)
AST: 12 U/L (ref 0–37)
Albumin: 2.3 g/dL — ABNORMAL LOW (ref 3.5–5.2)
Alkaline Phosphatase: 143 U/L — ABNORMAL HIGH (ref 39–117)
BUN: <3 mg/dL — ABNORMAL LOW (ref 6–23)
CO2: 28 mEq/L (ref 19–32)
Calcium: 9 mg/dL (ref 8.4–10.5)
Chloride: 102 mEq/L (ref 96–112)
Creatinine, Ser: 0.74 mg/dL (ref 0.50–1.10)
GFR calc Af Amer: >90 mL/min (ref >90)
GFR calc non Af Amer: >90 mL/min (ref >90)
Glucose, Bld: 135 mg/dL — ABNORMAL HIGH (ref 70–99)
Potassium: 3.5 mEq/L (ref 3.5–5.1)
Sodium: 139 mEq/L (ref 135–145)
Total Bilirubin: 0.8 mg/dL (ref 0.3–1.2)
Total Bilirubin: 0.7 mg/dL (ref 0.3–1.2)
Total Protein: 6 g/dL (ref 6.0–8.3)

## 2011-12-14 SURGERY — ERCP, WITH INTERVENTION IF INDICATED
Anesthesia: Monitor Anesthesia Care

## 2011-12-14 MED ORDER — VECURONIUM BROMIDE 10 MG IV SOLR
INTRAVENOUS | Status: DC | PRN
  Administered 2011-12-14: 5 mg via INTRAVENOUS

## 2011-12-14 MED ORDER — HYDROMORPHONE HCL PF 1 MG/ML IJ SOLN
0.2500 mg | INTRAMUSCULAR | Status: DC | PRN
  Administered 2011-12-14 (×2): 0.5 mg via INTRAVENOUS

## 2011-12-14 MED ORDER — GLYCOPYRROLATE 0.2 MG/ML IJ SOLN
INTRAMUSCULAR | Status: DC | PRN
  Administered 2011-12-14: .7 mg via INTRAVENOUS

## 2011-12-14 MED ORDER — DROPERIDOL 2.5 MG/ML IJ SOLN
0.6250 mg | INTRAMUSCULAR | Status: DC | PRN
  Administered 2011-12-14: 0.625 mg via INTRAVENOUS
  Filled 2011-12-14: qty 0.25

## 2011-12-14 MED ORDER — LACTATED RINGERS IV SOLN
INTRAVENOUS | Status: DC | PRN
  Administered 2011-12-14: 13:00:00 via INTRAVENOUS

## 2011-12-14 MED ORDER — PROPOFOL 10 MG/ML IV BOLUS
INTRAVENOUS | Status: DC | PRN
  Administered 2011-12-14: 200 mg via INTRAVENOUS

## 2011-12-14 MED ORDER — FENTANYL CITRATE 0.05 MG/ML IJ SOLN
INTRAMUSCULAR | Status: DC | PRN
  Administered 2011-12-14: 150 ug via INTRAVENOUS

## 2011-12-14 MED ORDER — ONDANSETRON HCL 4 MG/2ML IJ SOLN
INTRAMUSCULAR | Status: DC | PRN
  Administered 2011-12-14: 4 mg via INTRAVENOUS

## 2011-12-14 MED ORDER — NEOSTIGMINE METHYLSULFATE 1 MG/ML IJ SOLN
INTRAMUSCULAR | Status: DC | PRN
  Administered 2011-12-14: 4 mg via INTRAVENOUS

## 2011-12-14 MED ORDER — MIDAZOLAM HCL 5 MG/5ML IJ SOLN
INTRAMUSCULAR | Status: DC | PRN
  Administered 2011-12-14: 2 mg via INTRAVENOUS

## 2011-12-14 MED ORDER — SODIUM CHLORIDE 0.9 % IV SOLN
INTRAVENOUS | Status: DC | PRN
  Administered 2011-12-14: 14:00:00

## 2011-12-14 NOTE — Progress Notes (Signed)
Patient placed in prone position,chest rolls in place head rest in place,extremity pressure points padded.

## 2011-12-14 NOTE — Op Note (Signed)
[facility] [address] [place], [place]  [address]  ERCP PROCEDURE REPORT  PATIENT:  Karen Roberts, Dun  MR#:  [id number] BIRTHDATE:  [birth year]  GENDER:  female  ENDOSCOPIST:  Iva Boop, MD, [facility] ASSISTANT:  PROCEDURE DATE:  12/14/2011 PROCEDURE:  ERCP with stent placement ASA CLASS:  Class III  INDICATIONS:  post-chole bile leak  MEDICATIONS:   See Anesthesia Report. TOPICAL ANESTHETIC:  none  DESCRIPTION OF PROCEDURE:   After the risks benefits and alternatives of the procedure were thoroughly explained, informed consent was obtained.  The  endoscope was introduced through the mouth and advanced to the second portion of the duodenum. Esophagus not seen well but stomach and duodenum were normal/ The main papilla wa draining bile. It was freely cannulated and a wire passed. Injection of contrast showed normal biliary tree s/p cholecystectomy. No clear leak seen but ? of drain showing increased contrast.Pancreas not injected. A 5 cm 10 [place] plastic biliary stent was placed successfully.  The scope was then completely withdrawn from the patient and the procedure terminated. <<PROCEDUREIMAGES>>  COMPLICATIONS:  None  ENDOSCOPIC IMPRESSION: 1) Normal post-chole biliary tree without clear evidence of leak (though has had on HIDA) 2) Successful 5cm 10 French stent placement  RECOMMENDATIONS: Observe drain output return visit to Dr. Leone Payor 4-6 weeks - will need eventual sent removal before September 2013  Iva Boop, MD, [facility]  n. eSIGNED:   Iva Boop at 12/14/2011 06:57 PM  [name], [phone]

## 2011-12-14 NOTE — Brief Op Note (Signed)
12/12/2011 - 12/14/2011  2:28 PM  PATIENT:  Karen Roberts  41 y.o. female  PRE-OPERATIVE DIAGNOSIS:  bile leak  POST-OPERATIVE DIAGNOSIS:  same  PROCEDURE:  Procedure(s) (LRB): ENDOSCOPIC RETROGRADE CHOLANGIOPANCREATOGRAPHY (ERCP) (N/A)  SURGEON:  Surgeon(s) and Role:    * Iva Boop, MD - Primary  Normal biliary tree without clear evidence of leak. Could be picked up by the drain. Some 5 cm 10 Fr stent placed successfully.

## 2011-12-14 NOTE — Preoperative (Signed)
Beta Blockers   Reason not to administer Beta Blockers:Not Applicable 

## 2011-12-14 NOTE — Progress Notes (Signed)
Subjective: Lap chole 4/30: biloma Biloma drain placed 5/13 Pt feels better awaiting ERCP today  Objective: Vital signs in last 24 hours: Temp:  [98.4 F (36.9 C)-99 F (37.2 C)] 98.5 F (36.9 C) (05/15 0635) Pulse Rate:  [74-110] 96  (05/15 0635) Resp:  [17-21] 18  (05/15 0635) BP: (92-119)/(53-65) 92/54 mmHg (05/15 0635) SpO2:  [92 %-100 %] 92 % (05/15 0635) Last BM Date: 12/13/11  Intake/Output from previous day: 05/14 0701 - 05/15 0700 In: 3004.6 [P.O.:240; I.V.:2614.6; IV Piggyback:150] Out: 1700 [Urine:1100; Drains:600] Intake/Output this shift:    PE:  Afeb; vss 600 cc 5/14; bile Site clean and dry; tender No sign of infection Wbc down  Lab Results:   Basename 12/14/11 0612 12/13/11 0600  WBC 9.5 13.6*  HGB 9.0* 9.8*  HCT 27.7* 30.4*  PLT 775* 756*   BMET  Basename 12/14/11 0612 12/13/11 0600  NA 139 136  K 3.5 3.8  CL 102 99  CO2 28 26  GLUCOSE 125* 104*  BUN PENDING 6  CREATININE 0.74 0.74  CALCIUM 8.7 8.8   PT/INR No results found for this basename: LABPROT:2,INR:2 in the last 72 hours ABG No results found for this basename: PHART:2,PCO2:2,PO2:2,HCO3:2 in the last 72 hours  Studies/Results: Ct Guided Abscess Drain  12/13/2011  *RADIOLOGY REPORT*  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  Total Moderate Sedation Time: 20 minutes.  Procedure: The procedure, risks, benefits, and alternatives were explained to the patient. Questions regarding the procedure were encouraged and answered. The patient understands and consents to the procedure.  The epigastrium was prepped with betadine in a sterile fashion, and a sterile drape was applied covering the operative field. A sterile gown and sterile gloves were used for the procedure.  Under CT guidance, an 18 gauge needle was inserted into the gallbladder fossa myeloma and removed over an Amplatz.  A  12-French dilator and 12-French multipurpose drain was inserted and coiled in the gallbladder fossa.  It was looped and string fixed then sewn to the skin.  Bile was aspirated.  Findings: Imaging demonstrates biloma drain placement in the gallbladder fossa.  Complications: None.  IMPRESSION: Successful 12-French gallbladder fossa of biloma drain placement.  Original Report Authenticated By: Donavan Burnet, M.D.   US Abdomen Complete  12/12/2011  *RADIOLOGY REPORT*  Clinical Data:  Postop laparoscopic cholecystectomy.  Question bile leak.  Abdominal pain.  COMPLETE ABDOMINAL ULTRASOUND  Comparison:  CT 12/04/2011  Findings:  Gallbladder:  Prior cholecystectomy.  There is fluid within the gallbladder fossa as seen on prior CT.  Common bile duct:   Slightly prominent, 8 mm.  Liver:  No focal lesion.  Normal echotexture.  Small amount of fluid around the left hepatic lobe.  IVC:  Cannot visualize due to overlying bowel gas.  Pancreas:  Cannot visualize due to overlying bowel gas.  Spleen:  Within normal limits in size and echotexture.  Right Kidney:   Normal in size and parenchymal echogenicity.  No evidence of mass or hydronephrosis.  Left Kidney:  Normal in size and parenchymal echogenicity.  No evidence of mass or hydronephrosis.  Abdominal aorta:  No aneurysm identified.  IMPRESSION: Prior cholecystectomy.  Fluid is seen within the gallbladder fossa and adjacent to the left hepatic lobe.  Cannot exclude bile leak. May consider hepatobiliary scan for further evaluation.  Original Report Authenticated By: Cyndie Chime, M.D.    Anti-infectives: Anti-infectives  Start     Dose/Rate Route Frequency Ordered Stop   12/12/11 2200   piperacillin-tazobactam (ZOSYN) IVPB 3.375 g        3.375 g 12.5 mL/hr over 240 Minutes Intravenous 3 times per day 12/12/11 1441     12/12/11 1300   piperacillin-tazobactam (ZOSYN) IVPB 3.375 g        3.375 g 100 mL/hr over 30 Minutes Intravenous  Once 12/12/11 1228 12/12/11  1553          Assessment/Plan: s/p Procedure(s) (LRB): ENDOSCOPIC RETROGRADE CHOLANGIOPANCREATOGRAPHY (ERCP) (N/A)  Biloma drain placed 5/13 Feeling better Draining well Will follow; for ERCP today   Shiah Berhow A 12/14/2011

## 2011-12-14 NOTE — Transfer of Care (Signed)
Immediate Anesthesia Transfer of Care Note  Patient: Karen Roberts  Procedure(s) Performed: Procedure(s) (LRB): ENDOSCOPIC RETROGRADE CHOLANGIOPANCREATOGRAPHY (ERCP) (N/A)  Patient Location: PACU  Anesthesia Type: General  Level of Consciousness: awake, alert  and oriented  Airway & Oxygen Therapy: Patient Spontanous Breathing and Patient connected to nasal cannula oxygen  Post-op Assessment: Report given to PACU RN and Post -op Vital signs reviewed and stable  Post vital signs: Reviewed and stable  Complications: No apparent anesthesia complications

## 2011-12-14 NOTE — Anesthesia Postprocedure Evaluation (Signed)
Anesthesia Post Note  Patient: Karen Roberts  Procedure(s) Performed: Procedure(s) (LRB): ENDOSCOPIC RETROGRADE CHOLANGIOPANCREATOGRAPHY (ERCP) (N/A)  Anesthesia type: general  Patient location: PACU  Post pain: Pain level controlled  Post assessment: Patient's Cardiovascular Status Stable  Last Vitals:  Filed Vitals:   12/14/11 1405  BP:   Pulse:   Temp: 36.7 C  Resp:     Post vital signs: Reviewed and stable  Level of consciousness: sedated  Complications: No apparent anesthesia complications

## 2011-12-14 NOTE — Anesthesia Preprocedure Evaluation (Addendum)
Anesthesia Evaluation  Patient identified by MRN, date of birth, ID band Patient awake    Reviewed: Allergy & Precautions, H&P , NPO status , Patient's Chart, lab work & pertinent test results  History of Anesthesia Complications (+) PONV  Airway Mallampati: II      Dental  (+) Teeth Intact and Dental Advisory Given   Pulmonary neg pulmonary ROS,  breath sounds clear to auscultation  Pulmonary exam normal       Cardiovascular negative cardio ROS  Rhythm:Regular Rate:Normal     Neuro/Psych  Headaches, Anxiety    GI/Hepatic   Endo/Other  negative endocrine ROS  Renal/GU negative Renal ROS     Musculoskeletal   Abdominal   Peds  Hematology negative hematology ROS (+)   Anesthesia Other Findings   Reproductive/Obstetrics                         Anesthesia Physical Anesthesia Plan  ASA: II  Anesthesia Plan: General   Post-op Pain Management:    Induction: Intravenous  Airway Management Planned: Oral ETT  Additional Equipment:   Intra-op Plan:   Post-operative Plan: Extubation in OR  Informed Consent: I have reviewed the patients History and Physical, chart, labs and discussed the procedure including the risks, benefits and alternatives for the proposed anesthesia with the patient or authorized representative who has indicated his/her understanding and acceptance.   Dental advisory given  Plan Discussed with: Anesthesiologist and CRNA  Anesthesia Plan Comments:         Anesthesia Quick Evaluation

## 2011-12-14 NOTE — Progress Notes (Signed)
CRITICAL VALUE ALERT  Critical value received:  Platelets 925  Date of notification:  12/14/11  Time of notification:  2230  Critical value read back:yes  Nurse who received alert:  Stevphen Meuse, RN  MD notified (1st page): Dr. Johna Sheriff  Time of first page:  2327  MD notified (2nd page):  Time of second page:  Responding MD:  Dr. Johna Sheriff   Time MD responded:  2330  No new orders received

## 2011-12-14 NOTE — Progress Notes (Signed)
Subjective: Patient much less sore today. ERCP scheduled today in OR - appreciate Dr. Marvell Fuller assistance  Objective: Vital signs in last 24 hours: Temp:  [98.4 F (36.9 C)-99 F (37.2 C)] 99 F (37.2 C) (05/14 2200) Pulse Rate:  [74-110] 74  (05/14 2200) Resp:  [17-21] 18  (05/14 2200) BP: (98-119)/(53-65) 104/65 mmHg (05/14 2200) SpO2:  [93 %-100 %] 100 % (05/14 2200) Last BM Date: 12/13/11  Intake/Output from previous day: 05/14 0701 - 05/15 0700 In: 3004.6 [P.O.:240; I.V.:2614.6; IV Piggyback:150] Out: 1150 [Urine:700; Drains:450] Intake/Output this shift: Total I/O In: 2084.6 [P.O.:120; I.V.:1814.6; IV Piggyback:150] Out: 700 [Urine:500; Drains:200]  General appearance: alert, cooperative and no distress GI: much less tender; positive bowel sounds Incisions c/d/i  Lab Results:   Basename 12/13/11 0600 12/12/11 0740  WBC 13.6* 17.7*  HGB 9.8* 11.4*  HCT 30.4* 33.1*  PLT 756* 713*   BMET  Basename 12/13/11 0600 12/12/11 0740  NA 136 134*  K 3.8 3.6  CL 99 97  CO2 26 25  GLUCOSE 104* 130*  BUN 6 9  CREATININE 0.74 0.71  CALCIUM 8.8 9.7   PT/INR No results found for this basename: LABPROT:2,INR:2 in the last 72 hours ABG No results found for this basename: PHART:2,PCO2:2,PO2:2,HCO3:2 in the last 72 hours  Studies/Results: Ct Guided Abscess Drain  12/13/2011  *RADIOLOGY REPORT*  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  Total Moderate Sedation Time: 20 minutes.  Procedure: The procedure, risks, benefits, and alternatives were explained to the patient. Questions regarding the procedure were encouraged and answered. The patient understands and consents to the procedure.  The epigastrium was prepped with betadine in a sterile fashion, and a sterile drape was applied covering the operative field. A sterile gown and sterile gloves were used for the procedure.   Under CT guidance, an 18 gauge needle was inserted into the gallbladder fossa myeloma and removed over an Amplatz.  A 12-French dilator and 12-French multipurpose drain was inserted and coiled in the gallbladder fossa.  It was looped and string fixed then sewn to the skin.  Bile was aspirated.  Findings: Imaging demonstrates biloma drain placement in the gallbladder fossa.  Complications: None.  IMPRESSION: Successful 12-French gallbladder fossa of biloma drain placement.  Original Report Authenticated By: Donavan Burnet, M.D.   US Abdomen Complete  12/12/2011  *RADIOLOGY REPORT*  Clinical Data:  Postop laparoscopic cholecystectomy.  Question bile leak.  Abdominal pain.  COMPLETE ABDOMINAL ULTRASOUND  Comparison:  CT 12/04/2011  Findings:  Gallbladder:  Prior cholecystectomy.  There is fluid within the gallbladder fossa as seen on prior CT.  Common bile duct:   Slightly prominent, 8 mm.  Liver:  No focal lesion.  Normal echotexture.  Small amount of fluid around the left hepatic lobe.  IVC:  Cannot visualize due to overlying bowel gas.  Pancreas:  Cannot visualize due to overlying bowel gas.  Spleen:  Within normal limits in size and echotexture.  Right Kidney:   Normal in size and parenchymal echogenicity.  No evidence of mass or hydronephrosis.  Left Kidney:  Normal in size and parenchymal echogenicity.  No evidence of mass or hydronephrosis.  Abdominal aorta:  No aneurysm identified.  IMPRESSION: Prior cholecystectomy.  Fluid is seen within the gallbladder fossa and adjacent to the left hepatic lobe.  Cannot exclude bile leak. May consider hepatobiliary scan for further evaluation.  Original Report Authenticated By: Aubery Lapping  Kearney Hard, M.D.    Anti-infectives: Anti-infectives     Start     Dose/Rate Route Frequency Ordered Stop   12/12/11 2200   piperacillin-tazobactam (ZOSYN) IVPB 3.375 g        3.375 g 12.5 mL/hr over 240 Minutes Intravenous 3 times per day 12/12/11 1441     12/12/11 1300    piperacillin-tazobactam (ZOSYN) IVPB 3.375 g        3.375 g 100 mL/hr over 30 Minutes Intravenous  Once 12/12/11 1228 12/12/11 1553          Assessment/Plan: s/p Procedure(s) (LRB): ENDOSCOPIC RETROGRADE CHOLANGIOPANCREATOGRAPHY (ERCP) (N/A) ERCP today, restart clears after procedure  LOS: 2 days    Kela Baccari K. 12/14/2011

## 2011-12-14 NOTE — Anesthesia Procedure Notes (Signed)
Procedure Name: Intubation Date/Time: 12/14/2011 1:25 PM Performed by: Marena Chancy Pre-anesthesia Checklist: Patient identified, Emergency Drugs available, Suction available, Patient being monitored and Timeout performed Patient Re-evaluated:Patient Re-evaluated prior to inductionOxygen Delivery Method: Circle system utilized Preoxygenation: Pre-oxygenation with 100% oxygen Intubation Type: IV induction Ventilation: Mask ventilation without difficulty Laryngoscope Size: Miller and 2 Grade View: Grade II Tube type: Oral Number of attempts: 1 Placement Confirmation: ETT inserted through vocal cords under direct vision,  positive ETCO2 and breath sounds checked- equal and bilateral Secured at: 22 cm Tube secured with: Tape Dental Injury: Teeth and Oropharynx as per pre-operative assessment

## 2011-12-15 MED ORDER — OXYCODONE-ACETAMINOPHEN 5-325 MG PO TABS
1.0000 | ORAL_TABLET | ORAL | Status: DC | PRN
  Administered 2011-12-15 – 2011-12-17 (×8): 1 via ORAL
  Filled 2011-12-15 (×9): qty 1

## 2011-12-15 MED ORDER — KETOROLAC TROMETHAMINE 30 MG/ML IJ SOLN
30.0000 mg | Freq: Four times a day (QID) | INTRAMUSCULAR | Status: AC
  Administered 2011-12-15 – 2011-12-17 (×7): 30 mg via INTRAVENOUS
  Filled 2011-12-15 (×8): qty 1

## 2011-12-15 MED ORDER — ENOXAPARIN SODIUM 40 MG/0.4ML ~~LOC~~ SOLN
40.0000 mg | SUBCUTANEOUS | Status: DC
  Administered 2011-12-15 – 2011-12-18 (×4): 40 mg via SUBCUTANEOUS
  Filled 2011-12-15 (×6): qty 0.4

## 2011-12-15 MED ORDER — HYDROMORPHONE HCL PF 1 MG/ML IJ SOLN
1.0000 mg | INTRAMUSCULAR | Status: DC | PRN
  Administered 2011-12-15: 2 mg via INTRAVENOUS
  Administered 2011-12-15 – 2011-12-16 (×2): 1 mg via INTRAVENOUS
  Filled 2011-12-15 (×2): qty 1

## 2011-12-15 MED ORDER — METOCLOPRAMIDE HCL 5 MG/ML IJ SOLN
10.0000 mg | Freq: Four times a day (QID) | INTRAMUSCULAR | Status: DC
  Administered 2011-12-15 – 2011-12-19 (×15): 10 mg via INTRAVENOUS
  Filled 2011-12-15 (×20): qty 2

## 2011-12-15 NOTE — Progress Notes (Signed)
1 Day Post-Op  Subjective: S/p ERCP with stent placement.  No leak seen at cystic duct stump or from CBD.  Possibly a duct of Luschka.    The patient is having intermittent nausea.  No vomiting.  Several episodes of diarrhea a couple of days ago, but none today.  Objective: Vital signs in last 24 hours: Temp:  [98 F (36.7 C)-98.8 F (37.1 C)] 98.4 F (36.9 C) (05/16 0544) Pulse Rate:  [79-113] 89  (05/16 0544) Resp:  [12-36] 18  (05/16 0544) BP: (101-123)/(56-76) 101/59 mmHg (05/16 0544) SpO2:  [88 %-99 %] 92 % (05/16 0544) Last BM Date: 12/14/11  Intake/Output from previous day: 05/15 0701 - 05/16 0700 In: 3813 [P.O.:360; I.V.:3403; IV Piggyback:50] Out: 2125 [Urine:1550; Drains:275; Stool:300] Intake/Output this shift:    General appearance: alert, cooperative and mild distress GI: soft, active bowel sounds; minimally tender Incisions c/d/i Drain - clear bile; less volume than yesterday prior to ERCP Patient is emotional - tearful; has some issues with some staff - being addressed by nursing  Lab Results:   [facility] 12/14/11 2222 12/14/11 0612  WBC 9.2 9.5  HGB 9.7* 9.0*  HCT 29.7* 27.7*  PLT 925* 775*   BMET  Basename 12/14/11 2222 12/14/11 0612  NA 137 139  K 3.7 3.5  CL 102 102  CO2 28 28  GLUCOSE 135* 125*  BUN <3* 3*  CREATININE 0.74 0.74  CALCIUM 9.0 8.7   Lab Results  Component Value Date   ALT 15 12/14/2011   AST 14 12/14/2011   ALKPHOS 144* 12/14/2011   BILITOT 0.7 12/14/2011    PT/INR No results found for this basename: LABPROT:2,INR:2 in the last 72 hours ABG No results found for this basename: PHART:2,PCO2:2,PO2:2,HCO3:2 in the last 72 hours  Studies/Results: Dg Ercp  12/14/2011  *RADIOLOGY REPORT*  Clinical Data: Biliary leak.  Cholecystectomy.  ERCP  Comparison:  12/12/2011.  12/05/2011.  Technique:  Multiple spot images obtained with the fluoroscopic device and submitted for interpretation post-procedure.  ERCP was performed by Dr.  Leone Payor.  Findings: Initial images demonstrate a endoscope in the second part of the duodenum.  There is a pigtail drainage catheter in the right upper quadrant.  Subsequent images show cannulation of the common bile duct and retrograde filling.  There is no spill of contrast observed from the common bile duct.  IMPRESSION: 1.  No evidence of leak from the cystic or common bile duct. 2.  Pigtail drainage catheter in the right upper quadrant. 3.  In conjunction with the prior hepatic biliary study and CT, this probably represents a leak from the duct of Luschka.  These images were submitted for radiologic interpretation only. Please see the procedural report for the amount of contrast and the fluoroscopy time utilized.  Original Report Authenticated By: Andreas Newport, M.D.    Anti-infectives: Anti-infectives     Start     Dose/Rate Route Frequency Ordered Stop   12/12/11 2200   piperacillin-tazobactam (ZOSYN) IVPB 3.375 g        3.375 g 12.5 mL/hr over 240 Minutes Intravenous 3 times per day 12/12/11 1441     12/12/11 1300   piperacillin-tazobactam (ZOSYN) IVPB 3.375 g        3.375 g 100 mL/hr over 30 Minutes Intravenous  Once 12/12/11 1228 12/12/11 1553          Assessment/Plan: s/p Procedure(s) (LRB): ENDOSCOPIC RETROGRADE CHOLANGIOPANCREATOGRAPHY (ERCP) (N/A) Advance diet PO pain meds/ toradol  LOS: 3 days    Karen Roberts K.  12/15/2011  

## 2011-12-15 NOTE — Progress Notes (Signed)
1 Day Post-Op  Subjective: Biloma drain placed 5/13 Intact ERCP/stent 5/15 Pt feels better  Objective: Vital signs in last 24 hours: Temp:  [97.9 F (36.6 C)-98.8 F (37.1 C)] 97.9 F (36.6 C) (05/16 1046) Pulse Rate:  [79-113] 85  (05/16 1046) Resp:  [12-36] 20  (05/16 1046) BP: (101-123)/(56-76) 101/61 mmHg (05/16 1046) SpO2:  [88 %-99 %] 93 % (05/16 1046) Last BM Date: 12/14/11  Intake/Output from previous day: 05/15 0701 - 05/16 0700 In: 3813 [P.O.:360; I.V.:3403; IV Piggyback:50] Out: 2125 [Urine:1550; Drains:275; Stool:300] Intake/Output this shift: Total I/O In: 240 [P.O.:240] Out: 340 [Urine:300; Drains:40]  PE:  Afeb; vss Wbc nl Output 275 cc 5/15; 40 cc 5/16: bile Site clean and dry  Lab Results:   [facility] 12/14/11 2222 12/14/11 0612  WBC 9.2 9.5  HGB 9.7* 9.0*  HCT 29.7* 27.7*  PLT 925* 775*   BMET  Basename 12/14/11 2222 12/14/11 0612  NA 137 139  K 3.7 3.5  CL 102 102  CO2 28 28  GLUCOSE 135* 125*  BUN <3* 3*  CREATININE 0.74 0.74  CALCIUM 9.0 8.7   PT/INR No results found for this basename: LABPROT:2,INR:2 in the last 72 hours ABG No results found for this basename: PHART:2,PCO2:2,PO2:2,HCO3:2 in the last 72 hours  Studies/Results: Dg Ercp  12/14/2011  *RADIOLOGY REPORT*  Clinical Data: Biliary leak.  Cholecystectomy.  ERCP  Comparison:  12/12/2011.  12/05/2011.  Technique:  Multiple spot images obtained with the fluoroscopic device and submitted for interpretation post-procedure.  ERCP was performed by Dr. Leone Payor.  Findings: Initial images demonstrate a endoscope in the second part of the duodenum.  There is a pigtail drainage catheter in the right upper quadrant.  Subsequent images show cannulation of the common bile duct and retrograde filling.  There is no spill of contrast observed from the common bile duct.  IMPRESSION: 1.  No evidence of leak from the cystic or common bile duct. 2.  Pigtail drainage catheter in the right upper  quadrant. 3.  In conjunction with the prior hepatic biliary study and CT, this probably represents a leak from the duct of Luschka.  These images were submitted for radiologic interpretation only. Please see the procedural report for the amount of contrast and the fluoroscopy time utilized.  Original Report Authenticated By: Andreas Newport, M.D.    Anti-infectives: Anti-infectives     Start     Dose/Rate Route Frequency Ordered Stop   12/12/11 2200   piperacillin-tazobactam (ZOSYN) IVPB 3.375 g        3.375 g 12.5 mL/hr over 240 Minutes Intravenous 3 times per day 12/12/11 1441     12/12/11 1300   piperacillin-tazobactam (ZOSYN) IVPB 3.375 g        3.375 g 100 mL/hr over 30 Minutes Intravenous  Once 12/12/11 1228 12/12/11 1553          Assessment/Plan: s/p Procedure(s) (LRB): ENDOSCOPIC RETROGRADE CHOLANGIOPANCREATOGRAPHY (ERCP) (N/A)  Biloma drain intact Follow Need Re Ct when output less than 10 cc/ 24 hrs   Jhoel Stieg A 12/15/2011

## 2011-12-15 NOTE — Progress Notes (Signed)
[facility] Daily Rounding Note 12/15/2011, 9:26 AM  SUBJECTIVE:       Still has up to 7/10 pain in RUQ but good control with PRN Dilaudid, 7 doses totalling 12 mg yesterday, 3 doses/5 mg so far today Some nausea.  Hungry.  Surgeon allowing solids as of now.  No BM > 24 hours.  Loose stools 2 days ago.   OBJECTIVE:        General: Pale, non-toxic     Vital signs in last 24 hours:    Temp:  [98 F (36.7 C)-98.8 F (37.1 C)] 98.4 F (36.9 C) (05/16 0544) Pulse Rate:  [79-113] 89  (05/16 0544) Resp:  [12-36] 18  (05/16 0544) BP: (101-123)/(56-76) 101/59 mmHg (05/16 0544) SpO2:  [88 %-99 %] 92 % (05/16 0544) Last BM Date: 12/14/11  Heart: RRR Chest: Clear.  Abdomen: soft, less tender RUQ  Extremities: no pedal edema.  Neuro/Psych:  Pleasant.  Less anxious today.   Intake/Output from previous day: 05/15 0701 - 05/16 0700 In: 3813 [P.O.:360; I.V.:3403; IV Piggyback:50] Out: 2125 [Urine:1550; Drains:275; Stool:300]  Intake/Output this shift:    Lab Results:  Basename 12/14/11 2222 12/14/11 0612 12/13/11 0600  WBC 9.2 9.5 13.6*  HGB 9.7* 9.0* 9.8*  HCT 29.7* 27.7* 30.4*  PLT 925* 775* 756*   BMET  Basename 12/14/11 2222 12/14/11 0612 12/13/11 0600  NA 137 139 136  K 3.7 3.5 3.8  CL 102 102 99  CO2 28 28 26   GLUCOSE 135* 125* 104*  BUN <3* 3* 6  CREATININE 0.74 0.74 0.74  CALCIUM 9.0 8.7 8.8   LFT  Basename 12/14/11 2222 12/14/11 0612 12/13/11 0600  PROT 6.4 6.0 6.4  ALBUMIN 2.6* 2.3* 2.5*  AST 14 12 18   ALT 15 15 20   ALKPHOS 144* 143* 163*  BILITOT 0.7 0.8 1.0  BILIDIR -- -- --  IBILI -- -- --   Studies/Results: Dg Ercp  12/14/2011  *RADIOLOGY REPORT*  Clinical Data: Biliary leak.  Cholecystectomy.  ERCP  Comparison:  12/12/2011.  12/05/2011.  Technique:  Multiple spot images obtained with the fluoroscopic device and submitted for interpretation post-procedure.  ERCP was performed by Dr. Leone Payor.  Findings: Initial images demonstrate a endoscope in  the second part of the duodenum.  There is a pigtail drainage catheter in the right upper quadrant.  Subsequent images show cannulation of the common bile duct and retrograde filling.  There is no spill of contrast observed from the common bile duct.  IMPRESSION: 1.  No evidence of leak from the cystic or common bile duct. 2.  Pigtail drainage catheter in the right upper quadrant. 3.  In conjunction with the prior hepatic biliary study and CT, this probably represents a leak from the duct of Luschka.  These images were submitted for radiologic interpretation only. Please see the procedural report for the amount of contrast and the fluoroscopy time utilized.  Original Report Authenticated By: Andreas Newport, M.D.    ASSESMENT: *  Post lap chole bile leak.  Perc drain placed 5/13 S/p ERCP and biliary stent 12/14/11.  No active leak seen Drain output 275 yest compared to 600 ml 5/14 *  Hyperglycemia. *  [place] anemia.  Stable   PLAN: *  Agree with solid food.   LOS: 3 days   Jennye Moccasin  12/15/2011, 9:26 AM Pager: [phone]   [facility] Attending  I have also seen and assessed the patient and agree with the above note. Too soon to  say if drain output has decreased since stent. She does feel better. Will follow-up tomorrow.  Iva Boop, MD, [facility] [phone] (pager) 12/15/2011 5:31 PM

## 2011-12-16 ENCOUNTER — Encounter (INDEPENDENT_AMBULATORY_CARE_PROVIDER_SITE_OTHER): Payer: 59 | Admitting: Surgery

## 2011-12-16 ENCOUNTER — Encounter (HOSPITAL_COMMUNITY): Payer: Self-pay | Admitting: Internal Medicine

## 2011-12-16 MED ORDER — PANTOPRAZOLE SODIUM 40 MG PO TBEC
40.0000 mg | DELAYED_RELEASE_TABLET | Freq: Every day | ORAL | Status: DC
  Administered 2011-12-16 – 2011-12-18 (×3): 40 mg via ORAL
  Filled 2011-12-16 (×2): qty 1

## 2011-12-16 MED ORDER — POLYETHYLENE GLYCOL 3350 17 G PO PACK
17.0000 g | PACK | Freq: Every day | ORAL | Status: DC
  Administered 2011-12-16: 17 g via ORAL
  Filled 2011-12-16 (×4): qty 1

## 2011-12-16 MED ORDER — AMOXICILLIN-POT CLAVULANATE 875-125 MG PO TABS
1.0000 | ORAL_TABLET | Freq: Two times a day (BID) | ORAL | Status: DC
  Administered 2011-12-16 – 2011-12-18 (×6): 1 via ORAL
  Filled 2011-12-16 (×8): qty 1

## 2011-12-16 NOTE — Progress Notes (Signed)
     [facility] Daily Rounding Note 12/16/2011, 9:09 AM  SUBJECTIVE:       Using less frequent and lower doses of Dilaudid.  Toradol and Percocet added yesterday, using both. Pain bad overnight, still in the RUQ. vomitted last night. Eating maybe 50% of her meals.  No BMs Continue on Zosyn, no fevers.   Bile drainage 275 cc 5/15, 390 cc 5/16. OBJECTIVE:        General: anxious, pale not acutely ill or toxic     Vital signs in last 24 hours:    Temp:  [97.9 F (36.6 C)-98.2 F (36.8 C)] 98 F (36.7 C) (05/17 0538) Pulse Rate:  [85-90] 89  (05/17 0538) Resp:  [18-20] 19  (05/17 0538) BP: (101-130)/(52-69) 112/58 mmHg (05/17 0538) SpO2:  [93 %-97 %] 97 % (05/17 0538) Last BM Date: 12/14/11  Heart: RRR Chest: clear Abdomen: soft.  les tenderness in RUQ but just got pain meds  Extremities: no pedal edema Neuro/Psych:  Pleasant.  Anxious  Intake/Output from previous day: 05/16 0701 - 05/17 0700 In: 2273 [P.O.:720; I.V.:1403; IV Piggyback:150] Out: 1540 [Urine:1150; Drains:390]  Intake/Output this shift:    Lab Results:  [facility] 12/14/11 2222 12/14/11 0612  WBC 9.2 9.5  HGB 9.7* 9.0*  HCT 29.7* 27.7*  PLT 925* 775*   BMET  Basename 12/14/11 2222 12/14/11 0612  NA 137 139  K 3.7 3.5  CL 102 102  CO2 28 28  GLUCOSE 135* 125*  BUN <3* 3*  CREATININE 0.74 0.74  CALCIUM 9.0 8.7   LFT  Basename 12/14/11 2222 12/14/11 0612  PROT 6.4 6.0  ALBUMIN 2.6* 2.3*  AST 14 12  ALT 15 15  ALKPHOS 144* 143*  BILITOT 0.7 0.8  BILIDIR -- --  IBILI -- --   Studies/Results:  ASSESMENT: * Post lap chole bile leak. Perc drain placed 5/13  S/p ERCP and biliary stent 12/14/11. No active leak seen.  Still with significant abdominal pain and nausea.   * Hyperglycemia.  * [place] anemia. Stable   PLAN: *  Dr Corliss Skains ordered Miralax and Miralax.  *  wonder if a low dose of anxiolytic such as po Ativan might help?   LOS: 4 days   Jennye Moccasin  12/16/2011, 9:09 AM Pager: [phone]

## 2011-12-16 NOTE — Progress Notes (Signed)
Agree with Ms. Heron Nay assessment and plan. Continue to monitor drain output. Hopefully will progressively decrease. Will follow loosely for now. Iva Boop, MD, [facility]

## 2011-12-16 NOTE — Progress Notes (Signed)
2 Days Post-Op  Subjective: Tolerating regular diet Mild nausea Using PO pain meds  Objective: Vital signs in last 24 hours: Temp:  [97.9 F (36.6 C)-98.2 F (36.8 C)] 98 F (36.7 C) (05/17 0538) Pulse Rate:  [85-90] 89  (05/17 0538) Resp:  [18-20] 19  (05/17 0538) BP: (101-130)/(52-69) 112/58 mmHg (05/17 0538) SpO2:  [93 %-97 %] 97 % (05/17 0538) Last BM Date: 12/14/11  Intake/Output from previous day: 05/16 0701 - 05/17 0700 In: 2273 [P.O.:720; I.V.:1403; IV Piggyback:150] Out: 1540 [Urine:1150; Drains:390] Intake/Output this shift:    Abd - soft, minimal RUQ tenderness Incisions c/d/i Drain - clear bile   Lab Results:   Basename 12/14/11 2222 12/14/11 0612  WBC 9.2 9.5  HGB 9.7* 9.0*  HCT 29.7* 27.7*  PLT 925* 775*   BMET  Basename 12/14/11 2222 12/14/11 0612  NA 137 139  K 3.7 3.5  CL 102 102  CO2 28 28  GLUCOSE 135* 125*  BUN <3* 3*  CREATININE 0.74 0.74  CALCIUM 9.0 8.7   PT/INR No results found for this basename: LABPROT:2,INR:2 in the last 72 hours ABG No results found for this basename: PHART:2,PCO2:2,PO2:2,HCO3:2 in the last 72 hours  Studies/Results: Dg Ercp  12/14/2011  *RADIOLOGY REPORT*  Clinical Data: Biliary leak.  Cholecystectomy.  ERCP  Comparison:  12/12/2011.  12/05/2011.  Technique:  Multiple spot images obtained with the fluoroscopic device and submitted for interpretation post-procedure.  ERCP was performed by Dr. Leone Payor.  Findings: Initial images demonstrate a endoscope in the second part of the duodenum.  There is a pigtail drainage catheter in the right upper quadrant.  Subsequent images show cannulation of the common bile duct and retrograde filling.  There is no spill of contrast observed from the common bile duct.  IMPRESSION: 1.  No evidence of leak from the cystic or common bile duct. 2.  Pigtail drainage catheter in the right upper quadrant. 3.  In conjunction with the prior hepatic biliary study and CT, this probably  represents a leak from the duct of Luschka.  These images were submitted for radiologic interpretation only. Please see the procedural report for the amount of contrast and the fluoroscopy time utilized.  Original Report Authenticated By: Andreas Newport, M.D.    Anti-infectives: Anti-infectives     Start     Dose/Rate Route Frequency Ordered Stop   12/16/11 1000   amoxicillin-clavulanate (AUGMENTIN) 875-125 MG per tablet 1 tablet        1 tablet Oral Every 12 hours 12/16/11 0917     12/12/11 2200   piperacillin-tazobactam (ZOSYN) IVPB 3.375 g  Status:  Discontinued        3.375 g 12.5 mL/hr over 240 Minutes Intravenous 3 times per day 12/12/11 1441 12/16/11 0917   12/12/11 1300   piperacillin-tazobactam (ZOSYN) IVPB 3.375 g        3.375 g 100 mL/hr over 30 Minutes Intravenous  Once 12/12/11 1228 12/12/11 1553          Assessment/Plan: s/p Procedure(s) (LRB): ENDOSCOPIC RETROGRADE CHOLANGIOPANCREATOGRAPHY (ERCP) (N/A) PO antibiotics, saline lock Miralax Will go home on Augmentin with drain in place Will study prior to drain removal.   LOS: 4 days    Crista Nuon K. 12/16/2011

## 2011-12-17 MED ORDER — PROMETHAZINE HCL 25 MG/ML IJ SOLN: 12.5000 mg | Freq: Four times a day (QID) | INTRAMUSCULAR | Status: DC | PRN

## 2011-12-17 NOTE — Progress Notes (Signed)
<  principal problem not specified>  Subjective: She feels a little better today. Not much pain. Somewhat depressed because of prolonged postoperative course and the continued bile leakage. Tolerating diet okay. Think she'll be probably ready to go home tomorrow.  Objective: Vital signs in last 24 hours: Temp:  [98 F (36.7 C)-98.6 F (37 C)] 98 F (36.7 C) (05/18 0507) Pulse Rate:  [77-95] 89  (05/18 0507) Resp:  [18] 18  (05/18 0507) BP: (103-112)/(50-71) 106/71 mmHg (05/18 0507) SpO2:  [94 %-98 %] 94 % (05/18 0507) Last BM Date: 12/12/11  Intake/Output from previous day: 05/17 0701 - 05/18 0700 In: 600 [P.O.:600] Out: 1050 [Urine:800; Drains:250] Intake/Output this shift:    General appearance: alert, cooperative and no distress GI: soft minimally tender bowel sounds present bile coming through the percutaneous drain  Lab Results:  No results found for this or any previous visit (from the past 24 hour(s)).   Studies/Results Radiology     MEDS, Scheduled    . amoxicillin-clavulanate  1 tablet Oral Q12H  . enoxaparin (LOVENOX) injection  40 mg Subcutaneous Q24H  . ketorolac  30 mg Intravenous Q6H  . metoCLOPramide (REGLAN) injection  10 mg Intravenous Q6H  . pantoprazole  40 mg Oral Q1200  . polyethylene glycol  17 g Oral Daily  . DISCONTD: piperacillin-tazobactam (ZOSYN)  IV  3.375 g Intravenous Q8H     Assessment: <principal problem not specified> Bile leak status post laparoscopic cholecystectomy. Has percutaneous drain and ERCP P. Placed stent.  Plan: We'll continue current management today. Will recheck labs in the morning. May be able to be discharged tomorrow. If she continues high bile output she may benefit from a larger ERCP placed stent. Will reevaluate in the morning.  LOS: 5 days    Currie Paris, MD, [facility], [place] [phone]   12/17/2011 9:05 AM

## 2011-12-17 NOTE — Progress Notes (Signed)
  The patient has persistent bile drainage output without decline as one would expect after placing a biliary stent. I placed a 10 French stent which should have sufficed. The lack of leak on ERCP raises at least some ? Of other type of injury. Will have her do an MRCP to better evaluate this.   Ass/Plan  Bile leak after lap chole  Check MRCP due to persistent bile leak despite stenting.  Iva Boop, MD, [facility] [phone] 12/17/2011 3:47 PM

## 2011-12-17 NOTE — Progress Notes (Addendum)
3 Days Post-Op  Subjective: Pt doing fairly well; has some mild RUQ discomfort; depressed about being in hospital  Objective: Vital signs in last 24 hours: Temp:  [98 F (36.7 C)-98.6 F (37 C)] 98 F (36.7 C) (05/18 0507) Pulse Rate:  [77-95] 89  (05/18 0507) Resp:  [18] 18  (05/18 0507) BP: (103-112)/(50-71) 106/71 mmHg (05/18 0507) SpO2:  [94 %-98 %] 94 % (05/18 0507) Last BM Date: 12/17/11  Intake/Output from previous day: 05/17 0701 - 05/18 0700 In: 600 [P.O.:600] Out: 1050 [Urine:800; Drains:250] Intake/Output this shift: Total I/O In: 240 [P.O.:240] Out: 450 [Urine:400; Drains:50]  RUQ/biloma drain intact, output 50 cc's today, site mildly tender to palpation  Lab Results:   [facility] 12/14/11 2222  WBC 9.2  HGB 9.7*  HCT 29.7*  PLT 925*   BMET  Basename 12/14/11 2222  NA 137  K 3.7  CL 102  CO2 28  GLUCOSE 135*  BUN <3*  CREATININE 0.74  CALCIUM 9.0   PT/INR No results found for this basename: LABPROT:2,INR:2 in the last 72 hours ABG No results found for this basename: PHART:2,PCO2:2,PO2:2,HCO3:2 in the last 72 hours Results for orders placed during the hospital encounter of 11/28/11  SURGICAL PCR SCREEN     Status: Normal   Collection Time   11/28/11 10:30 AM      Component Value Range Status Comment   MRSA, PCR NEGATIVE  NEGATIVE  Final    Staphylococcus aureus NEGATIVE  NEGATIVE  Final     Studies/Results: No results found.  Anti-infectives: Anti-infectives     Start     Dose/Rate Route Frequency Ordered Stop   12/16/11 1000  amoxicillin-clavulanate (AUGMENTIN) 875-125 MG per tablet 1 tablet       1 tablet Oral Every 12 hours 12/16/11 0917     12/12/11 2200   piperacillin-tazobactam (ZOSYN) IVPB 3.375 g  Status:  Discontinued        3.375 g 12.5 mL/hr over 240 Minutes Intravenous 3 times per day 12/12/11 1441 12/16/11 0917   12/12/11 1300  piperacillin-tazobactam (ZOSYN) IVPB 3.375 g       3.375 g 100 mL/hr over 30 Minutes  Intravenous  Once 12/12/11 1228 12/12/11 1553          Assessment/Plan: S/p biloma drain placement 5/13 ; check bile cx's, cont drain , check f/u CT once output diminishes   LOS: 5 days    Nannie Starzyk,D [facility] 12/17/2011

## 2011-12-18 ENCOUNTER — Inpatient Hospital Stay (HOSPITAL_COMMUNITY): Payer: 59

## 2011-12-18 LAB — COMPREHENSIVE METABOLIC PANEL
Albumin: 3.2 g/dL — ABNORMAL LOW (ref 3.5–5.2)
Alkaline Phosphatase: 161 U/L — ABNORMAL HIGH (ref 39–117)
BUN: 7 mg/dL (ref 6–23)
Chloride: 103 mEq/L (ref 96–112)
Creatinine, Ser: 0.82 mg/dL (ref 0.50–1.10)
GFR calc Af Amer: >90 mL/min (ref >90)
Glucose, Bld: 94 mg/dL (ref 70–99)
Total Bilirubin: 0.6 mg/dL (ref 0.3–1.2)
Total Protein: 7.4 g/dL (ref 6.0–8.3)

## 2011-12-18 LAB — CBC
HCT: 34.6 % — ABNORMAL LOW (ref 36.0–46.0)
Hemoglobin: 11.2 g/dL — ABNORMAL LOW (ref 12.0–15.0)
MCHC: 32.4 g/dL (ref 30.0–36.0)
MCV: 89.6 fL (ref 78.0–100.0)
RDW: 14 % (ref 11.5–15.5)

## 2011-12-18 MED ORDER — GADOBENATE DIMEGLUMINE 529 MG/ML IV SOLN
20.0000 mL | Freq: Once | INTRAVENOUS | Status: AC | PRN
  Administered 2011-12-18: 20 mL via INTRAVENOUS

## 2011-12-18 MED ORDER — ZOLPIDEM TARTRATE 5 MG PO TABS
5.0000 mg | ORAL_TABLET | Freq: Every evening | ORAL | Status: DC | PRN
  Administered 2011-12-18: 5 mg via ORAL
  Filled 2011-12-18: qty 1

## 2011-12-18 MED ORDER — LORAZEPAM 2 MG/ML IJ SOLN
1.0000 mg | Freq: Once | INTRAMUSCULAR | Status: AC
  Administered 2011-12-18: 1 mg via INTRAVENOUS

## 2011-12-18 MED ORDER — LORAZEPAM 1 MG PO TABS
1.0000 mg | ORAL_TABLET | Freq: Two times a day (BID) | ORAL | Status: DC
  Administered 2011-12-18 (×2): 1 mg via ORAL
  Filled 2011-12-18 (×2): qty 1

## 2011-12-18 NOTE — Progress Notes (Signed)
<principal problem not specified>  Subjective: Feels okay today no increasing pain. Tolerating diet. Continues to be concerned about the amount of drainage from the percutaneous drain. Has been more anxious over the last couple days. She just came back from having the MRCP.  Objective: Vital signs in last 24 hours: Temp:  [97.6 F (36.4 C)-98.9 F (37.2 C)] 97.6 F (36.4 C) (05/19 0945) Pulse Rate:  [88-104] 104  (05/19 0945) Resp:  [18] 18  (05/19 0945) BP: (110-123)/(71-78) 119/78 mmHg (05/19 0945) SpO2:  [94 %-98 %] 98 % (05/19 0945) Last BM Date: 12/17/11  Intake/Output from previous day: 05/18 0701 - 05/19 0700 In: 976 [P.O.:960; IV Piggyback:16] Out: 1475 [Urine:1300; Drains:175] Intake/Output this shift:    General appearance: alert, cooperative, fatigued and no distress GI: abdomen remained soft and basically nontender. She continued to have bilious drainage at the percutaneous drain.  Lab Results:  Results for orders placed during the hospital encounter of 12/12/11 (from the past 24 hour(s))  BODY FLUID CULTURE     Status: Normal (Preliminary result)   Collection Time   12/17/11 12:35 PM      Component Value Range   Specimen Description BILE     Special Requests NONE     Gram Stain PENDING     Culture NO GROWTH 1 DAY     Report Status PENDING    CBC     Status: Abnormal   Collection Time   12/18/11  5:40 AM      Component Value Range   WBC 8.5  4.0 - 10.5 (K/uL)   RBC 3.86 (*) 3.87 - 5.11 (MIL/uL)   Hemoglobin 11.2 (*) 12.0 - 15.0 (g/dL)   HCT 40.9 (*) 81.1 - 46.0 (%)   MCV 89.6  78.0 - 100.0 (fL)   MCH 29.0  26.0 - 34.0 (pg)   MCHC 32.4  30.0 - 36.0 (g/dL)   RDW 91.4  78.2 - 95.6 (%)   Platelets 1100 (*) 150 - 400 (K/uL)  COMPREHENSIVE METABOLIC PANEL     Status: Abnormal   Collection Time   12/18/11  5:40 AM      Component Value Range   Sodium 141  135 - 145 (mEq/L)   Potassium 3.6  3.5 - 5.1 (mEq/L)   Chloride 103  96 - 112 (mEq/L)   CO2 23  19 - 32  (mEq/L)   Glucose, Bld 94  70 - 99 (mg/dL)   BUN 7  6 - 23 (mg/dL)   Creatinine, Ser 2.13  0.50 - 1.10 (mg/dL)   Calcium 9.7  8.4 - 08.6 (mg/dL)   Total Protein 7.4  6.0 - 8.3 (g/dL)   Albumin 3.2 (*) 3.5 - 5.2 (g/dL)   AST 69 (*) 0 - 37 (U/L)   ALT 55 (*) 0 - 35 (U/L)   Alkaline Phosphatase 161 (*) 39 - 117 (U/L)   Total Bilirubin 0.6  0.3 - 1.2 (mg/dL)   GFR calc non Af Amer 87 (*) >90 (mL/min)   GFR calc Af Amer >90  >90 (mL/min)     Studies/Results Radiology     MEDS, Scheduled    . amoxicillin-clavulanate  1 tablet Oral Q12H  . enoxaparin (LOVENOX) injection  40 mg Subcutaneous Q24H  . ketorolac  30 mg Intravenous Q6H  . LORazepam  1 mg Intravenous Once  . metoCLOPramide (REGLAN) injection  10 mg Intravenous Q6H  . pantoprazole  40 mg Oral Q1200  . polyethylene glycol  17 g Oral Daily  Assessment: <principal problem not specified> Bile leak status post cholecystectomy with percutaneous drain and stent in place and continued drainage. The drainage seems to be diminishing with a 24 hour output going from 390 cc to 250 cc and then down to 175 cc on the last 24-hour shift record.is more than would be expected after stent placement.  Plan: Await results of the MRCP to make any further decisions about management plans. It may be that her standpoint needs to be changed for a larger size.  LOS: 6 days    Currie Paris, MD, [facility], [place] [phone]   12/18/2011 9:51 AM

## 2011-12-18 NOTE — Progress Notes (Signed)
Patient would like something for anxiety and something to help her sleep at night, per the night nurse.

## 2011-12-18 NOTE — Progress Notes (Signed)
[facility] Daily Rounding Note 12/18/2011, 10:34 AM  SUBJECTIVE:        2 doses of Toradol for pain yesterday.  No Dilaudid used. She says pain is better.  She is very worried about ongoing biliary drain output and fact that output is again getting darker. Did not sleep well and is ever anxious.   OBJECTIVE:        General: Anxious, looks better     Vital signs in last 24 hours:    Temp:  [97.6 F (36.4 C)-98.9 F (37.2 C)] 97.6 F (36.4 C) (05/19 0945) Pulse Rate:  [88-104] 104  (05/19 0945) Resp:  [18] 18  (05/19 0945) BP: (110-123)/(71-78) 119/78 mmHg (05/19 0945) SpO2:  [94 %-98 %] 98 % (05/19 0945) Last BM Date: 12/17/11  Heart: tach, regular Chest: Clear.  Not SOB.   Abdomen: soft, NT except over the catheter site.  Bile is brown and clear. 100 ml in the bag.  Extremities: no pedal edema Neuro/Psych:  Anxious, near tears at one point.  Intake/Output from previous day: Biliary drain:  175 ml 5/18                       250 ml  5/17  No drainage reported for today.    Lab Results:  [facility] 12/18/11 0540  WBC 8.5  HGB 11.2*  HCT 34.6*  PLT 1100*   BMET  Basename 12/18/11 0540  NA 141  K 3.6  CL 103  CO2 23  GLUCOSE 94  BUN 7  CREATININE 0.82  CALCIUM 9.7   LFT  Basename 12/18/11 0540  PROT 7.4  ALBUMIN 3.2*  AST 69*  ALT 55*  ALKPHOS 161*  BILITOT 0.6  BILIDIR --  IBILI --   Studies/Results: No MRI/MRCP report yet.   ASSESMENT: * Post lap chole bile leak. Perc drain placed 5/13.  On po Augmentin.  S/p ERCP and biliary stent 12/14/11. No active leak seen but was on HIDA before - ? Declining drainage - did yesterday but still significant output and no clear trend downward AST, ALT, Alk Phos rising. - slightly  *  Anxiety, situational. * [place] anemia.  Improved *Thrombocytosis,  Steadily rising over time. Did not discuss with pt as it is likely to make her more anxious, so unless Heme eval called, would not alert pt of this until she is less  worried.   PLAN: *  Add scheduled Ativan, BID for 3 days and Ambien at HS *  Await reading of MRCP     LOS: 6 days   Karen Roberts  12/18/2011, 10:34 AM Pager: [phone]   [facility] Attending  I have also seen and assessed the patient and agree with the above note. Additions made. My findings otherwise same.  Thrombocytosis is presumably reactive - no signs of thrombosis mentioned or noted on MR.  I have reviewed the MRCP and MR with radiologist. No aberrant anatomy/injury seen. The biliary stent I placed is not clearly seen. i suspect it is still there just not seen well on MR but if it has fallen out then that may be why drainage has not rapidly decreased.  Will check KUB. If stent out will need a repeat ERCP and another stent. I would watch drainage output and if it is still declining would send her home (if stent still in place) as I do not think upsizing stent  Likely to make much difference. We will reassess tomorrow  and make plans.  Iva Boop, MD, [facility] [phone] (pager) 12/18/2011 2:42 PM

## 2011-12-18 NOTE — Progress Notes (Signed)
I reviewed the preliminary results of the MRCP with the radiologist, Dr. Harmon Pier. The pigtail catheter appears to be in good position to control the leak. There is no obvious abnormality otherwise. He had difficulty visualizing the stent in the common duct and sagittal reconstructions were going to be done.  I discussed this with her gastroenterologist will followup later today. She may need another ERCP depending on his evaluation. I reviewed that with her.

## 2011-12-19 ENCOUNTER — Other Ambulatory Visit (INDEPENDENT_AMBULATORY_CARE_PROVIDER_SITE_OTHER): Payer: Self-pay | Admitting: Surgery

## 2011-12-19 DIAGNOSIS — K839 Disease of biliary tract, unspecified: Secondary | ICD-10-CM

## 2011-12-19 LAB — CBC
HCT: 36.5 % (ref 36.0–46.0)
Hemoglobin: 11.9 g/dL — ABNORMAL LOW (ref 12.0–15.0)
MCHC: 32.6 g/dL (ref 30.0–36.0)
RBC: 4.05 MIL/uL (ref 3.87–5.11)

## 2011-12-19 MED ORDER — ASPIRIN EC 325 MG PO TBEC: 325.0000 mg | DELAYED_RELEASE_TABLET | Freq: Every day | ORAL | Status: DC

## 2011-12-19 MED ORDER — OXYCODONE-ACETAMINOPHEN 5-325 MG PO TABS: 1.0000 | ORAL_TABLET | ORAL | Status: DC | PRN

## 2011-12-19 MED FILL — Lorazepam Inj 2 MG/ML: INTRAMUSCULAR | Qty: 1 | Status: AC

## 2011-12-19 NOTE — Progress Notes (Signed)
5 Days Post-Op  Subjective: Patient feels much better today.  Ambulating, tolerating regular diet,  + bowel movements MRCP - no leak KUB - stent in good place Drain about 250 ml clear bile Patient ready to go home  Objective: Vital signs in last 24 hours: Temp:  [97.6 F (36.4 C)-98.8 F (37.1 C)] 98.6 F (37 C) (05/20 0532) Pulse Rate:  [79-104] 104  (05/20 0532) Resp:  [18] 18  (05/20 0532) BP: (105-119)/(63-78) 109/63 mmHg (05/20 0532) SpO2:  [93 %-98 %] 96 % (05/20 0532) Last BM Date: 12/18/11  Intake/Output from previous day: 05/19 0701 - 05/20 0700 In: -  Out: 1150 [Urine:900; Drains:250] Intake/Output this shift:    General appearance: alert, cooperative and no distress Abd - soft, non-tender; clear bile from drain  Lab Results:   Basename 12/19/11 0500 12/18/11 0540  WBC 9.3 8.5  HGB 11.9* 11.2*  HCT 36.5 34.6*  PLT 1150* 1100*   BMET  Basename 12/18/11 0540  NA 141  K 3.6  CL 103  CO2 23  GLUCOSE 94  BUN 7  CREATININE 0.82  CALCIUM 9.7   PT/INR No results found for this basename: LABPROT:2,INR:2 in the last 72 hours ABG No results found for this basename: PHART:2,PCO2:2,PO2:2,HCO3:2 in the last 72 hours  Studies/Results: Dg Abd 1 View  12/18/2011  *RADIOLOGY REPORT*  Clinical Data: Evaluate CBD stent.  ABDOMEN - 1 VIEW  Comparison: Recent MRI and ERCP  Findings: A CBD stent is identified and appears appropriately positioned. A percutaneous pigtail catheter is again identified overlying the right abdomen. The bowel gas pattern is unremarkable.  IMPRESSION: CBD stent - satisfactory position.  Original Report Authenticated By: Rosendo Gros, M.D.   Mr 3d Recon At Scanner  12/18/2011  *RADIOLOGY REPORT*  Clinical Data:  41 year old female with history of bile leak. Persistent bile drainage from percutaneous catheter after CBD stent placement.  MRI ABDOMEN WITHOUT AND WITH CONTRAST (INCLUDING MRCP)  Technique:  Multiplanar multisequence MR imaging of  the abdomen was performed both before and after the administration of intravenous contrast. Heavily T2-weighted images of the biliary and pancreatic ducts were obtained, and three-dimensional MRCP images were rendered by post processing.  Contrast: 20mL MULTIHANCE GADOBENATE DIMEGLUMINE 529 MG/ML IV SOLN  Comparison:  12/14/2011 ERCP, 12/12/2011 CT and 12/05/2011 hepatic biliary study.  Findings:  A pigtail drainage catheter is identified in the gallbladder fossa. The visualized biliary system is unremarkable. The CBD stent is difficult to definitely identify on this study. There is no evidence of bile leak or source of bile leak identified on this examination.  The liver, spleen, pancreas, adrenal glands and kidneys are unremarkable. No enlarged lymph nodes or abdominal aortic aneurysm noted. There is no evidence of focal collection or abscess.  IMPRESSION: No biliary abnormalities identified.  Specifically, no evidence of source of bile leak.  CBD stent is difficult to identify.  Percutaneous pigtail catheter in the region of the gallbladder fossa.  Original Report Authenticated By: Rosendo Gros, M.D.   Mr Abd W/wo Cm/mrcp  12/18/2011  *RADIOLOGY REPORT*  Clinical Data:  41 year old female with history of bile leak. Persistent bile drainage from percutaneous catheter after CBD stent placement.  MRI ABDOMEN WITHOUT AND WITH CONTRAST (INCLUDING MRCP)  Technique:  Multiplanar multisequence MR imaging of the abdomen was performed both before and after the administration of intravenous contrast. Heavily T2-weighted images of the biliary and pancreatic ducts were obtained, and three-dimensional MRCP images were rendered by post processing.  Contrast: 20mL  MULTIHANCE GADOBENATE DIMEGLUMINE 529 MG/ML IV SOLN  Comparison:  12/14/2011 ERCP, 12/12/2011 CT and 12/05/2011 hepatic biliary study.  Findings:  A pigtail drainage catheter is identified in the gallbladder fossa. The visualized biliary system is unremarkable. The  CBD stent is difficult to definitely identify on this study. There is no evidence of bile leak or source of bile leak identified on this examination.  The liver, spleen, pancreas, adrenal glands and kidneys are unremarkable. No enlarged lymph nodes or abdominal aortic aneurysm noted. There is no evidence of focal collection or abscess.  IMPRESSION: No biliary abnormalities identified.  Specifically, no evidence of source of bile leak.  CBD stent is difficult to identify.  Percutaneous pigtail catheter in the region of the gallbladder fossa.  Original Report Authenticated By: Rosendo Gros, M.D.    Anti-infectives: Anti-infectives     Start     Dose/Rate Route Frequency Ordered Stop   12/16/11 1000   amoxicillin-clavulanate (AUGMENTIN) 875-125 MG per tablet 1 tablet        1 tablet Oral Every 12 hours 12/16/11 0917     12/12/11 2200   piperacillin-tazobactam (ZOSYN) IVPB 3.375 g  Status:  Discontinued        3.375 g 12.5 mL/hr over 240 Minutes Intravenous 3 times per day 12/12/11 1441 12/16/11 0917   12/12/11 1300   piperacillin-tazobactam (ZOSYN) IVPB 3.375 g        3.375 g 100 mL/hr over 30 Minutes Intravenous  Once 12/12/11 1228 12/12/11 1553          Assessment/Plan: s/p Procedure(s) (LRB): ENDOSCOPIC RETROGRADE CHOLANGIOPANCREATOGRAPHY (ERCP) (N/A) Discharge Bile leak - controlled with drain; no ongoing leak per MRCP; residual biloma Thrombocytosis - likely reactive as earlier labs were normal; start aspirin each day Finish course of antibiotics PRN percocet  LOS: 7 days    Karah Caruthers K. 12/19/2011

## 2011-12-19 NOTE — Discharge Instructions (Signed)
You make take showers, but seal off the drain site with plastic wrap and tape.  My nurse will contact you to schedule outpatient labs and a follow-up appointment.  Record the drain output daily.  Regular diet Finish your antibiotics that you already have at home

## 2011-12-19 NOTE — Discharge Summary (Signed)
Physician Discharge Summary  Patient ID: Karen Roberts MRN: [id number] DOB/AGE: 1971-03-12 41 y.o.  Admit date: 12/12/2011 Discharge date: 12/19/2011  Admission Diagnoses:  Bile leak s/p cholecystectomy  Discharge Diagnoses: Bile leak from duct of Luschka  Active Problems:  Bile leak, postoperative   Discharged Condition: good  Hospital Course: The patient was readmitted with severe pain, elevated WBC, and mildly elevated LFT's.  Ultrasound showed fluid collection around the liver and in the gallbladder fossa.  A CT-guided percutaneous drain was placed in the gallbladder fossa, which yielded a large amount of clear bile.  Her labs returned to normal.  GI was consulted and Dr. Leone Payor placed a biliary stent by ERCP on 5/15.  No bile leak was seen, so this was felt to represent a leak from a duct of Luschka.  She began feeling better, but her output remained relatively high with a slight trend downwards.  She underwent a MRCP on 5/19 which showed no sign of leak and a KUB confirmed the stent in good position.  She has developed some thrombocytosis, but no sign of thrombosis  Consults: GI  Significant Diagnostic Studies: labs: LFT's and WBC back to normal;, radiology: MRI: no sign of ongoing bile leak and ERCP - no sign of bile leak  Treatments: procedures: ERCP with biliary stent placement and percutaneous drainage catheter placement  Discharge Exam: Blood pressure 109/63, pulse 104, temperature 98.6 F (37 C), temperature source Oral, resp. rate 18, height 5\' 5"  (1.651 m), weight 213 lb 13.5 oz (97 kg), last menstrual period 12/05/2011, SpO2 96.00%. Abdomen soft, non-tender except around drain Incisions c/d/i   Disposition: 01-Home or Self Care  Discharge Orders    Future Orders Please Complete By Expires   Diet general      Increase activity slowly      May walk up steps      May shower / Bathe      Driving Restrictions      Comments:   Do not drive while taking pain  medications   Call MD for:  temperature >100.4      Call MD for:  persistant nausea and vomiting      Call MD for:  severe uncontrolled pain      Call MD for:  redness, tenderness, or signs of infection (pain, swelling, redness, odor or green/yellow discharge around incision site)      No dressing needed      Discharge instructions      Comments:   Empty bile drain daily and record the output.  Bring this to the office at your next appointment.      Medication List  As of 12/19/2011  7:24 AM   STOP taking these medications         HYDROmorphone 2 MG tablet         TAKE these medications         amoxicillin-clavulanate 875-125 MG per tablet   Commonly known as: AUGMENTIN   Take 1 tablet by mouth every 12 (twelve) hours. Take for 10 days. First dose 12/08/2011.      aspirin EC 325 MG tablet   Take 1 tablet (325 mg total) by mouth daily.      oxyCODONE-acetaminophen 5-325 MG per tablet   Commonly known as: PERCOCET   Take 1 tablet by mouth every 4 (four) hours as needed.      promethazine 12.5 MG tablet   Commonly known as: PHENERGAN   Take 12.5 mg by mouth every  6 (six) hours as needed. For nausea           Follow-up Information    Follow up with Renelle Stegenga K., MD in 1 week.   Contact information:   [employer] [facility] [address] [place] [address] [phone]       Follow up with Stan Head, MD. Schedule an appointment as soon as possible for a visit in 3 weeks.   Contact information:   [address] [facility] [address] [place] [address] [place] [address] [phone]          Signed: Wynona Luna. 12/19/2011, 7:24 AM

## 2011-12-19 NOTE — Progress Notes (Signed)
DC home with son, verbally and  understands biliary drain care. Understands DC instruction sheet. Excited to be going home. No questions asked.

## 2011-12-20 ENCOUNTER — Encounter (HOSPITAL_COMMUNITY): Payer: Self-pay | Admitting: Emergency Medicine

## 2011-12-20 ENCOUNTER — Telehealth (INDEPENDENT_AMBULATORY_CARE_PROVIDER_SITE_OTHER): Payer: Self-pay | Admitting: General Surgery

## 2011-12-20 ENCOUNTER — Telehealth: Payer: Self-pay

## 2011-12-20 ENCOUNTER — Emergency Department (HOSPITAL_COMMUNITY)
Admission: EM | Admit: 2011-12-20 | Discharge: 2011-12-20 | Disposition: A | Discharge status: Home / Self Care | Payer: 59 | Attending: Emergency Medicine | Admitting: Emergency Medicine

## 2011-12-20 DIAGNOSIS — F411 Generalized anxiety disorder: Secondary | ICD-10-CM | POA: Insufficient documentation

## 2011-12-20 DIAGNOSIS — R109 Unspecified abdominal pain: Secondary | ICD-10-CM | POA: Insufficient documentation

## 2011-12-20 DIAGNOSIS — Z79899 Other long term (current) drug therapy: Secondary | ICD-10-CM | POA: Insufficient documentation

## 2011-12-20 DIAGNOSIS — R7401 Elevation of levels of liver transaminase levels: Secondary | ICD-10-CM | POA: Insufficient documentation

## 2011-12-20 DIAGNOSIS — R7402 Elevation of levels of lactic acid dehydrogenase (LDH): Secondary | ICD-10-CM | POA: Insufficient documentation

## 2011-12-20 DIAGNOSIS — G8918 Other acute postprocedural pain: Secondary | ICD-10-CM | POA: Insufficient documentation

## 2011-12-20 LAB — LIPASE, BLOOD: Lipase: 86 U/L — ABNORMAL HIGH (ref 11–59)

## 2011-12-20 LAB — COMPREHENSIVE METABOLIC PANEL
Alkaline Phosphatase: 159 U/L — ABNORMAL HIGH (ref 39–117)
BUN: 11 mg/dL (ref 6–23)
CO2: 24 mEq/L (ref 19–32)
Calcium: 10.2 mg/dL (ref 8.4–10.5)
GFR calc Af Amer: >90 mL/min (ref >90)
GFR calc non Af Amer: >90 mL/min (ref >90)
Glucose, Bld: 98 mg/dL (ref 70–99)
Total Protein: 7.9 g/dL (ref 6.0–8.3)

## 2011-12-20 LAB — CBC
HCT: 37.1 % (ref 36.0–46.0)
Hemoglobin: 12.2 g/dL (ref 12.0–15.0)
MCH: 29.5 pg (ref 26.0–34.0)
MCV: 89.6 fL (ref 78.0–100.0)
RBC: 4.14 MIL/uL (ref 3.87–5.11)

## 2011-12-20 LAB — DIFFERENTIAL
Eosinophils Absolute: 0.3 10*3/uL (ref 0.0–0.7)
Eosinophils Relative: 4 % (ref 0–5)
Lymphs Abs: 2.4 10*3/uL (ref 0.7–4.0)
Monocytes Absolute: 0.9 10*3/uL (ref 0.1–1.0)
Monocytes Relative: 10 % (ref 3–12)

## 2011-12-20 LAB — URINALYSIS, ROUTINE W REFLEX MICROSCOPIC
Specific Gravity, Urine: 1.03 (ref 1.005–1.030)
Urobilinogen, UA: 0.2 mg/dL (ref 0.0–1.0)
pH: 5.5 (ref 5.0–8.0)

## 2011-12-20 LAB — BASIC METABOLIC PANEL
BUN: 11 mg/dL (ref 6–23)
Calcium: 10.2 mg/dL (ref 8.4–10.5)
GFR calc non Af Amer: >90 mL/min (ref >90)
Glucose, Bld: 102 mg/dL — ABNORMAL HIGH (ref 70–99)

## 2011-12-20 LAB — URINE MICROSCOPIC-ADD ON

## 2011-12-20 MED ORDER — ONDANSETRON HCL 4 MG/2ML IJ SOLN
4.0000 mg | Freq: Once | INTRAMUSCULAR | Status: AC
  Administered 2011-12-20: 4 mg via INTRAVENOUS
  Filled 2011-12-20: qty 2

## 2011-12-20 MED ORDER — MORPHINE SULFATE 4 MG/ML IJ SOLN
4.0000 mg | Freq: Once | INTRAMUSCULAR | Status: AC
  Administered 2011-12-20: 4 mg via INTRAVENOUS
  Filled 2011-12-20: qty 1

## 2011-12-20 MED ORDER — OXYCODONE-ACETAMINOPHEN 5-325 MG PO TABS
1.0000 | ORAL_TABLET | Freq: Once | ORAL | Status: AC
  Administered 2011-12-20: 1 via ORAL
  Filled 2011-12-20: qty 1

## 2011-12-20 MED ORDER — LORAZEPAM 2 MG/ML IJ SOLN
1.0000 mg | Freq: Once | INTRAMUSCULAR | Status: AC
  Administered 2011-12-20: 1 mg via INTRAVENOUS
  Filled 2011-12-20: qty 1

## 2011-12-20 MED ORDER — ONDANSETRON 4 MG PO TBDP
8.0000 mg | ORAL_TABLET | ORAL | Status: AC
  Administered 2011-12-20: 8 mg via ORAL
  Filled 2011-12-20: qty 2

## 2011-12-20 NOTE — Telephone Encounter (Signed)
Message copied by Annett Fabian on Tue Dec 20, 2011  5:19 PM ------      Message from: Stan Head E      Created: Mon Dec 19, 2011  2:50 PM      Regarding: RE: appt for eval of biliary stent       Office visit in about 1 month      ----- Message -----         From: Dianah Field, PA         Sent: 12/19/2011  10:05 AM           To: Rossie Muskrat, RN,CGRN, Iva Boop, MD      Subject: appt for eval of biliary stent                           This pt needs her biliary stent removed in about 6 weeks.  Ask Baldo Ash how he wants to handle this, but suspect he wants to see her in office first.      I am ccing this to him as well.              Have a nice week.              Thanks, Maralyn Sago

## 2011-12-20 NOTE — Telephone Encounter (Signed)
Pt calling, stating "I just feel weird," and "I don't feel right."  She denies fever and pain, but is very sensitive about her drainage tube.  She states it doesn't feel right where it is going into her.  Paged Dr. Corliss Skains and updated; pt has labs ordered for tomorrow and office visit on Friday.  Dr. Corliss Skains had no additional orders or insights at this time.  Called pt and spent time reassuring her that the drainage tube is there TO DRAIN, so not to be concerned that lots of fluid is coming out.  She will go for her labs tomorrow.  Pt instructed to call if she develops a fever of >101 and to take pain meds as needed.

## 2011-12-20 NOTE — ED Provider Notes (Signed)
History     CSN: [id number]  Arrival date & time 12/20/11  4098   First MD Initiated Contact with Patient 12/20/11 1929      Chief Complaint  Patient presents with  . Abdominal Pain    (Consider location/radiation/quality/duration/timing/severity/associated sxs/prior treatment) HPI Pt with hx of cholecystectomy 4/30 and postoperative bile leak with biliary stent and percutaneous biliary drain presenting with concern of feeling that her biliary drain "doesn't feel right"  She also c/o some upper abdominal pain.  She was discharged yesterday after one week stay due to bile leak.  Drain has continued to drain green clear fluid- drainage has been consistent, she is measuring every 3 hours.  No fever/chills, no vomiting.  Has been having some loose stools- but no blood or mucous.  Pt has significant anxiety about the drain in her stomach and many questions about it.  She states her pain became worse tonight but did not take any percocet because "it isn't that kind of pain".  Her mother at the bedside with her states "basically her nerves are shot".  There are no other assoicated systemic symptoms, there are no alleviaitng or modifying factors.   Past Medical History  Diagnosis Date  . Allergy   . Anxiety   . PONV (postoperative nausea and vomiting)   . Ligament tear     left; "all are torn"  . Headache     "every once in awhile"  . Postoperative bile leak     Past Surgical History  Procedure Date  . Cesarean section 2000  . Appendectomy 2011  . Cholecystectomy 11/29/2011    Procedure: LAPAROSCOPIC CHOLECYSTECTOMY WITH INTRAOPERATIVE CHOLANGIOGRAM;  Surgeon: Wilmon Arms. Corliss Skains, MD;  Location: [facility] OR;  Service: General;  Laterality: N/A;  . Rlq abscess drain placement 12/12/11  . Ercp 12/14/2011    Procedure: ENDOSCOPIC RETROGRADE CHOLANGIOPANCREATOGRAPHY (ERCP);  Surgeon: Iva Boop, MD;  Location: [facility] OR;  Service: Endoscopy;  Laterality: N/A;    Family History  Problem Relation Age  of Onset  . Cancer Maternal Grandmother     lung/brain  . Anesthesia problems Neg Hx     History  Substance Use Topics  . Smoking status: Never Smoker   . Smokeless tobacco: Never Used  . Alcohol Use: Yes     12/12/11 "maybe once/ monthly [place] iced teas"    OB History    Grav Para Term Preterm Abortions TAB SAB Ect Mult Living                  Review of Systems ROS reviewed and all otherwise negative except for mentioned in HPI  Allergies  Ciprofloxacin  Home Medications   Current Outpatient Rx  Name Route Sig Dispense Refill  . AMOXICILLIN-POT CLAVULANATE 875-125 MG PO TABS Oral Take 1 tablet by mouth every 12 (twelve) hours. Take for 10 days. Started on 12/08/2011    . ASPIRIN EC 325 MG PO TBEC Oral Take 325 mg by mouth every morning.    . OXYCODONE-ACETAMINOPHEN 5-325 MG PO TABS Oral Take 1 tablet by mouth every 4 (four) hours as needed. For pain.    Marland Kitchen PROMETHAZINE HCL 12.5 MG PO TABS Oral Take 12.5 mg by mouth every 6 (six) hours as needed. For nausea    . ALPRAZOLAM 0.25 MG PO TABS Oral Take 1 tablet (0.25 mg total) by mouth continuous as needed for anxiety. 12 tablet 0    BP 126/90  Pulse 97  Temp(Src) 98.6 F (37 C) (Oral)  Resp 18  SpO2 99%  LMP 12/05/2011 Vitals reviewed Physical Exam Physical Examination: General appearance - alert, well appearing, and in no distress Mental status - alert, oriented to person, place, and time Eyes - pupils equal and reactive, extraocular eye movements intact Mouth - mucous membranes moist, pharynx normal without lesions Chest - clear to auscultation, no wheezes, rales or rhonchi, symmetric air entry Heart - normal rate, regular rhythm, normal S1, S2, no murmurs, rubs, clicks or gallops Abdomen - soft, diffuse mild tenderness, no gaurding or rebound, nondistended, no masses or organomegaly, biliary drain in place- draining clear green fluid- tubing intact and sutured in place- site clean/dry/intact no erythema or  drainage Extremities - peripheral pulses normal, no pedal edema, no clubbing or cyanosis Skin - normal coloration and turgor, no rashes, brisk cap refill Psych-anxious, tearful  ED Course  Procedures (including critical care time)  10:30 PM  D/w Dr. Janee Morn, surgery- reviewed labs and patient presentation.  He states that the mild variations in labs are not worrisome at this time- he recommends keeping appointment with Dr. Corliss Skains for Friday- and he will let Dr. Corliss Skains know about her ED visit.    Labs Reviewed  URINALYSIS, ROUTINE W REFLEX MICROSCOPIC - Abnormal; Notable for the following:    APPearance CLOUDY (*)    Hgb urine dipstick SMALL (*)    Bilirubin Urine SMALL (*)    Leukocytes, UA TRACE (*)    All other components within normal limits  CBC - Abnormal; Notable for the following:    Platelets 990 (*) CRITICAL VALUE NOTED.  VALUE IS CONSISTENT WITH PREVIOUSLY REPORTED AND CALLED VALUE.   All other components within normal limits  BASIC METABOLIC PANEL - Abnormal; Notable for the following:    Glucose, Bld 102 (*)    All other components within normal limits  COMPREHENSIVE METABOLIC PANEL - Abnormal; Notable for the following:    AST 51 (*)    ALT 73 (*)    Alkaline Phosphatase 159 (*)    All other components within normal limits  LIPASE, BLOOD - Abnormal; Notable for the following:    Lipase 86 (*)    All other components within normal limits  URINE MICROSCOPIC-ADD ON - Abnormal; Notable for the following:    Squamous Epithelial / LPF MANY (*)    Bacteria, UA MANY (*)    Crystals CA OXALATE CRYSTALS (*)    All other components within normal limits  DIFFERENTIAL  PREGNANCY, URINE  POCT PREGNANCY, URINE  URINE CULTURE   No results found.   1. Transaminitis   2. Postoperative pain       MDM  Pt presents after cholecystectomy and resultant bile leak- c/o abdominal pain as well as concern about her biliary drain.  Pt also shows significant anxiety on exam.  Pt  given pain control, anxiety meds.  Labs reviewed by me as well as reviewed with Dr. Janee Morn, surgery- he states there is no need for emergent imaging at this time- pt to f/u with Dr. Corliss Skains later this week as previously scheduled.  Pt reassured and questions about drain answered to the best of my ability. Discharged with strict return precautions.  Pt agreeable with plan.        Ethelda Chick, MD 12/21/11 1816

## 2011-12-20 NOTE — Telephone Encounter (Signed)
The phone number listed is not a working number.  I have mailed her a letter asking her to call back to schedule a follow up appt

## 2011-12-20 NOTE — Discharge Instructions (Signed)
Return to the ED with any concerns including vomiting, fever/chills, worsening pain, change in fluid output of biliary drain, or any other alarming symptoms  You should be sure to take pain medications as prescribed.

## 2011-12-20 NOTE — ED Notes (Signed)
PT. REPORTS MID ABDOMINAL PAIN " DISCOMFORT/ TIGHT" ONSET TODAY , DENIES NAUSEA OR VOMITTING , NO FEVER OR CHILLS , S/P CHOLECYSTECTOMY LAST MONTH BY DR. Corliss Skains  , BILIARY DRAIN INTACT .

## 2011-12-21 ENCOUNTER — Other Ambulatory Visit (INDEPENDENT_AMBULATORY_CARE_PROVIDER_SITE_OTHER): Payer: Self-pay | Admitting: Surgery

## 2011-12-21 ENCOUNTER — Encounter: Payer: Self-pay | Admitting: Family Medicine

## 2011-12-21 ENCOUNTER — Ambulatory Visit (INDEPENDENT_AMBULATORY_CARE_PROVIDER_SITE_OTHER): Payer: 59 | Admitting: Family Medicine

## 2011-12-21 VITALS — BP 110/70 | HR 97 | Wt 199.0 lb

## 2011-12-21 DIAGNOSIS — F419 Anxiety disorder, unspecified: Secondary | ICD-10-CM

## 2011-12-21 DIAGNOSIS — F411 Generalized anxiety disorder: Secondary | ICD-10-CM

## 2011-12-21 LAB — COMPREHENSIVE METABOLIC PANEL
ALT: 84 U/L — ABNORMAL HIGH (ref 0–35)
AST: 65 U/L — ABNORMAL HIGH (ref 0–37)
Alkaline Phosphatase: 151 U/L — ABNORMAL HIGH (ref 39–117)
BUN: 11 mg/dL (ref 6–23)
Chloride: 103 mEq/L (ref 96–112)
Creat: 0.91 mg/dL (ref 0.50–1.10)
Total Bilirubin: 0.9 mg/dL (ref 0.3–1.2)

## 2011-12-21 LAB — BODY FLUID CULTURE: Gram Stain: NONE SEEN

## 2011-12-21 MED ORDER — ALPRAZOLAM 0.25 MG PO TABS: 0.2500 mg | ORAL_TABLET | ORAL | Status: DC | PRN

## 2011-12-21 NOTE — Progress Notes (Signed)
  Subjective:    Patient ID: Karen Roberts, female    DOB: April 22, 1971, 41 y.o.   MRN: [id number]  HPI She is here for consultation. She has had a stormy postoperative course after initially having gallbladder surgery. She did develop a bile duct leak and had to be stented. This has caused her to become quite upset. She is concerned about how long she will need to be dealing with this. She has had difficulty coping with this and cries easily.  Review of Systems     Objective:   Physical Exam Alert and tearful. The stent appears to be draining properly with greenish fluid present.       Assessment & Plan:   1. Anxiety  ALPRAZolam (XANAX) 0.25 MG tablet   I talked to her at length concerning the fact that this is a very unusual complication after having the surgery. She was concerned about how long this would last and I explained that it is very difficult to tell how long she will have trouble with this. Encouraged her to discuss this with the surgeon. Encouraged her to allow her motions to come out but do it in the proper environment. I said she gave her permission to have all the anxiety and be upset over the situation. Xanax given to help with anxiety.

## 2011-12-22 ENCOUNTER — Telehealth (INDEPENDENT_AMBULATORY_CARE_PROVIDER_SITE_OTHER): Payer: Self-pay

## 2011-12-22 LAB — URINE CULTURE
Colony Count: 45000
Culture  Setup Time: 201305220909

## 2011-12-22 LAB — CBC
HCT: 36.7 % (ref 36.0–46.0)
MCHC: 32.2 g/dL (ref 30.0–36.0)
MCV: 92 fL (ref 78.0–100.0)
Platelets: 923 10*3/uL — ABNORMAL HIGH (ref 150–400)
RDW: 14.1 % (ref 11.5–15.5)
WBC: 6.5 10*3/uL (ref 4.0–10.5)

## 2011-12-22 NOTE — Telephone Encounter (Signed)
Calling to notify alert labs on pt w/platelet count of 923.

## 2011-12-23 ENCOUNTER — Encounter (INDEPENDENT_AMBULATORY_CARE_PROVIDER_SITE_OTHER): Payer: Self-pay | Admitting: Surgery

## 2011-12-23 ENCOUNTER — Ambulatory Visit (INDEPENDENT_AMBULATORY_CARE_PROVIDER_SITE_OTHER): Payer: 59 | Admitting: Surgery

## 2011-12-23 VITALS — BP 110/86 | HR 84 | Temp 98.7°F | Resp 14 | Ht 66.0 in | Wt 198.0 lb

## 2011-12-23 DIAGNOSIS — K9189 Other postprocedural complications and disorders of digestive system: Secondary | ICD-10-CM

## 2011-12-23 DIAGNOSIS — K929 Disease of digestive system, unspecified: Secondary | ICD-10-CM

## 2011-12-23 DIAGNOSIS — K838 Other specified diseases of biliary tract: Secondary | ICD-10-CM

## 2011-12-23 NOTE — Progress Notes (Signed)
The patient was discharged from the hospital on 5/20 after being readmitted for severe abdominal pain, elevated WBC, and abnormal LFT's.  A percutaneous drain was placed into her gallbladder fossa with bilious output.  She then underwent an ERCP with placement of a 10 Fr stent.  No leak was seen at the cystic duct stump, so this leak was presumed to be from a duct of Luschka.  The drain continued to put out about 500 ml each day, so the patient underwent a MRCP on 5/18 which showed no sign of bile leak.  Her labs had returned to normal.  Her abdominal pain had improved dramatically, so she was discharged home on PRN Percocet and PO Augmentin.  Labs from 5/22 Lab Results  Component Value Date   WBC 6.5 12/21/2011   HGB 11.8* 12/21/2011   HCT 36.7 12/21/2011   MCV 92.0 12/21/2011   PLT 923* 12/21/2011     Chemistry      Component Value Date/Time   NA 138 12/21/2011 1111   K 5.0 12/21/2011 1111   CL 103 12/21/2011 1111   CO2 24 12/21/2011 1111   BUN 11 12/21/2011 1111   CREATININE 0.91 12/21/2011 1111   CREATININE 0.79 12/20/2011 2001      Component Value Date/Time   CALCIUM 9.8 12/21/2011 1111   ALKPHOS 151* 12/21/2011 1111   AST 65* 12/21/2011 1111   ALT 84* 12/21/2011 1111   BILITOT 0.9 12/21/2011 1111     She remains very anxious after this prolonged course after what appeared to be a routine laparoscopic cholecystectomy.  Her abdominal pain is much improved and she is only taking 1-2 pain pills a day.  The pain medicine has caused some constipation, but this seems to be improving with Miralax.  She is still concerned that she is having about 150-200 ml of bilious drainage each day.  I explained to her that she may have been leaking slowly from this duct of Luschka from the day of surgery on April 30 until the drain was placed two weeks later.  There likely was a large biloma that needed to be drained.  She seems to be clinically stable with no signs of fever or other signs of infection.    Her  abdomen is soft and tender only at the drain site.  The drain site appears clean and dry.  There is about 20 ml of clear bile in the bag.  Her laparoscopic incisions are completely healed with no sign of infection.  Filed Vitals:   12/23/11 1324  BP: 110/86  Pulse: 84  Temp: 98.7 F (37.1 C)  Resp: 14    Imp:  S/p laparoscopic cholecystectomy with IOC for chronic calculus cholecystitis complicated by bile leak from a duct of Luschka S/p placement of percutaneous drain and ERCP/stent  Plan:  Repeat ultrasound next week to determine the amount of fluid remaining in the gallbladder fossa and in the perihepatic region.  Follow-up 1 week.  I spent a considerable amount of time reassuring the patient that she is improving and there doesn't seem to be any infection or worsening problems.  Wilmon Arms. Corliss Skains, MD, [facility] Surgery  12/23/2011 10:34 PM

## 2011-12-23 NOTE — ED Notes (Signed)
+   Urine Chart sent to EDP office for review. 

## 2011-12-25 NOTE — ED Notes (Signed)
Chart returned from EDP office. Yeast in urine is likely secondary to current antibiotic treatment. Ensure patient has followed up with surgeon. Called patient and was informed patient will see surgeon on Fri.

## 2011-12-26 ENCOUNTER — Encounter (HOSPITAL_COMMUNITY): Payer: Self-pay | Admitting: *Deleted

## 2011-12-26 ENCOUNTER — Emergency Department (HOSPITAL_COMMUNITY)
Admission: EM | Admit: 2011-12-26 | Discharge: 2011-12-26 | Disposition: A | Discharge status: Home / Self Care | Payer: 59 | Attending: Emergency Medicine | Admitting: Emergency Medicine

## 2011-12-26 DIAGNOSIS — K838 Other specified diseases of biliary tract: Secondary | ICD-10-CM | POA: Insufficient documentation

## 2011-12-26 DIAGNOSIS — Z711 Person with feared health complaint in whom no diagnosis is made: Secondary | ICD-10-CM

## 2011-12-26 NOTE — Discharge Instructions (Signed)
If There is any worsening in your condition return to the emergency department. Follow up with your surgeon as directed.

## 2011-12-26 NOTE — ED Notes (Signed)
Pt states she woke up with larger amount than usual of BRB in bile duct drain.  No other complaints-no pain, n/v, diarrhea, diaphoresis.

## 2011-12-26 NOTE — ED Notes (Signed)
No rx, pt voiced understanding to f/u with surgeon

## 2011-12-26 NOTE — ED Provider Notes (Signed)
Medical screening examination/treatment/procedure(s) were performed by non-physician practitioner and as supervising physician I was immediately available for consultation/collaboration.   Dayton Bailiff, MD 12/26/11 [phone]

## 2011-12-26 NOTE — ED Provider Notes (Signed)
History     CSN: [id number]  Arrival date & time 12/26/11  0536   First MD Initiated Contact with Patient 12/26/11 0602      No chief complaint on file.   (Consider location/radiation/quality/duration/timing/severity/associated sxs/prior treatment) HPI Patient presents to the emergency department for evaluation of her biliary drain.  Patient was concerned because there some bleeding into the tube and into the bag.  She denies increased pain, nausea, vomiting, or fever.  Patient is not having any complaints other than this, blood in the tube and bag. Past Medical History  Diagnosis Date  . Allergy   . Anxiety   . PONV (postoperative nausea and vomiting)   . Ligament tear     left; "all are torn"  . Headache     "every once in awhile"  . Postoperative bile leak     Past Surgical History  Procedure Date  . Cesarean section 2000  . Appendectomy 2011  . Cholecystectomy 11/29/2011    Procedure: LAPAROSCOPIC CHOLECYSTECTOMY WITH INTRAOPERATIVE CHOLANGIOGRAM;  Surgeon: Wilmon Arms. Corliss Skains, MD;  Location: [facility] OR;  Service: General;  Laterality: N/A;  . Rlq abscess drain placement 12/12/11  . Ercp 12/14/2011    Procedure: ENDOSCOPIC RETROGRADE CHOLANGIOPANCREATOGRAPHY (ERCP);  Surgeon: Iva Boop, MD;  Location: [facility] OR;  Service: Endoscopy;  Laterality: N/A;    Family History  Problem Relation Age of Onset  . Cancer Maternal Grandmother     lung/brain  . Anesthesia problems Neg Hx     History  Substance Use Topics  . Smoking status: Never Smoker   . Smokeless tobacco: Never Used  . Alcohol Use: Yes     12/12/11 "maybe once/ monthly [place] iced teas"    OB History    Grav Para Term Preterm Abortions TAB SAB Ect Mult Living                  Review of Systems All other systems negative except as documented in the HPI. All pertinent positives and negatives as reviewed in the HPI.  Allergies  Ciprofloxacin  Home Medications   Current Outpatient Rx  Name Route Sig  Dispense Refill  . ALPRAZOLAM 0.25 MG PO TABS Oral Take 1 tablet (0.25 mg total) by mouth continuous as needed for anxiety. 12 tablet 0  . AMOXICILLIN-POT CLAVULANATE 875-125 MG PO TABS Oral Take 1 tablet by mouth every 12 (twelve) hours. Take for 10 days. Started on 12/08/2011    . ASPIRIN EC 325 MG PO TBEC Oral Take 325 mg by mouth every morning.    . OXYCODONE-ACETAMINOPHEN 5-325 MG PO TABS Oral Take 1 tablet by mouth every 4 (four) hours as needed. For pain.    Marland Kitchen PROMETHAZINE HCL 12.5 MG PO TABS Oral Take 12.5 mg by mouth every 6 (six) hours as needed. For nausea      BP 115/63  Pulse 95  Temp(Src) 98.3 F (36.8 C) (Oral)  Resp 20  SpO2 98%  LMP 12/05/2011  Physical Exam  Constitutional: She appears well-developed and well-nourished. No distress.  Cardiovascular: Normal rate and regular rhythm.   Pulmonary/Chest: Effort normal and breath sounds normal.  Abdominal: Soft. Bowel sounds are normal. She exhibits no distension. There is no tenderness. There is no rebound and no guarding.    ED Course  Procedures (including critical care time)  The patient has a small amount of blood noted into the bag and the tube.  I explained to the patient lying on her back for extended time  while asleep will decrease the output until she gets upright and it drained. The blood that is mostly mixed into the bile in the bag.  Advised her to follow up with her surgeon as directed this week. The patient drain looks to be doing well. No complications noted at this time.   MDM          Carlyle Dolly, PA-C 12/26/11 0630

## 2011-12-26 NOTE — ED Notes (Signed)
Pt states she had a bile duct tube placed 2 weeks ago. Small amount of blood noted ever once in a while but this morning woke up with blood in tube and bag and after draining bag blood still coming from tube.

## 2011-12-27 ENCOUNTER — Telehealth: Payer: Self-pay | Admitting: Internal Medicine

## 2011-12-27 ENCOUNTER — Other Ambulatory Visit: Payer: Self-pay | Admitting: Interventional Radiology

## 2011-12-27 NOTE — Telephone Encounter (Signed)
I have attempted to return the call again.  This is not a working number.  I will await a return call from the patient (see letter dated 12/20/11)

## 2011-12-27 NOTE — Telephone Encounter (Signed)
Patient called back and she is scheduled for follow up for 01/06/12

## 2011-12-28 ENCOUNTER — Encounter (INDEPENDENT_AMBULATORY_CARE_PROVIDER_SITE_OTHER): Payer: 59 | Admitting: Surgery

## 2011-12-28 ENCOUNTER — Ambulatory Visit
Admission: RE | Admit: 2011-12-28 | Discharge: 2011-12-28 | Disposition: A | Discharge status: Home / Self Care | Payer: 59 | Source: Ambulatory Visit | Attending: Interventional Radiology | Admitting: Interventional Radiology

## 2011-12-28 ENCOUNTER — Ambulatory Visit
Admission: RE | Admit: 2011-12-28 | Discharge: 2011-12-28 | Disposition: A | Discharge status: Home / Self Care | Payer: 59 | Source: Ambulatory Visit | Attending: Surgery | Admitting: Surgery

## 2011-12-29 ENCOUNTER — Ambulatory Visit (INDEPENDENT_AMBULATORY_CARE_PROVIDER_SITE_OTHER): Payer: 59 | Admitting: Surgery

## 2011-12-29 ENCOUNTER — Encounter (INDEPENDENT_AMBULATORY_CARE_PROVIDER_SITE_OTHER): Payer: Self-pay | Admitting: Surgery

## 2011-12-29 VITALS — BP 110/80 | HR 116 | Temp 97.6°F | Resp 18 | Ht 66.0 in | Wt 196.0 lb

## 2011-12-29 DIAGNOSIS — K929 Disease of digestive system, unspecified: Secondary | ICD-10-CM

## 2011-12-29 DIAGNOSIS — K8010 Calculus of gallbladder with chronic cholecystitis without obstruction: Secondary | ICD-10-CM

## 2011-12-29 DIAGNOSIS — K838 Other specified diseases of biliary tract: Secondary | ICD-10-CM

## 2011-12-29 MED ORDER — PROMETHAZINE HCL 12.5 MG PO TABS: 12.5000 mg | ORAL_TABLET | Freq: Four times a day (QID) | ORAL | Status: DC | PRN

## 2011-12-29 NOTE — Progress Notes (Signed)
The patient is feeling much better this week. She is out and about more frequently. Appetite is improving. She still does not have much energy. Her abdominal pain is much improved. Her drain continues to put out about 150 cc each day. She had an ultrasound yesterday which showed only a 2 cm fluid collection around the tip of the drainage catheter. There is no sign of any large fluid collections. This probably represents residual biloma. Her drain site looks good. Her abdomen is otherwise soft and nontender. Her incisions are all well healed.  Imp:  Resolving biloma  Plan:  Continue drain for now.  Recheck in 1 week.  Hopefully the drain will be ready to pull.  If not, will consider repeat MRCP.  Finish antibiotics.  Wilmon Arms. Corliss Skains, MD, [facility] Surgery  12/29/2011 12:11 PM

## 2011-12-30 ENCOUNTER — Encounter (INDEPENDENT_AMBULATORY_CARE_PROVIDER_SITE_OTHER): Payer: 59

## 2011-12-30 ENCOUNTER — Telehealth (INDEPENDENT_AMBULATORY_CARE_PROVIDER_SITE_OTHER): Payer: Self-pay | Admitting: General Surgery

## 2011-12-30 NOTE — Telephone Encounter (Signed)
Pt calling panicked because she woke from sleep with drainage coming out around her tubing.  She is unable to understand how to milk the tubing to clear it, so will come in for a nurse only visit to be taught.

## 2012-01-02 ENCOUNTER — Encounter: Payer: Self-pay | Admitting: *Deleted

## 2012-01-04 ENCOUNTER — Other Ambulatory Visit (HOSPITAL_COMMUNITY): Payer: 59

## 2012-01-04 ENCOUNTER — Encounter (HOSPITAL_COMMUNITY): Payer: Self-pay

## 2012-01-04 ENCOUNTER — Telehealth (INDEPENDENT_AMBULATORY_CARE_PROVIDER_SITE_OTHER): Payer: Self-pay

## 2012-01-04 ENCOUNTER — Observation Stay (HOSPITAL_COMMUNITY): Payer: 59

## 2012-01-04 ENCOUNTER — Inpatient Hospital Stay (HOSPITAL_COMMUNITY)
Admission: EM | Admit: 2012-01-04 | Discharge: 2012-01-09 | DRG: 395 | Disposition: A | Discharge status: Home / Self Care | Payer: 59 | Attending: Surgery | Admitting: Surgery

## 2012-01-04 DIAGNOSIS — K9189 Other postprocedural complications and disorders of digestive system: Secondary | ICD-10-CM | POA: Diagnosis present

## 2012-01-04 DIAGNOSIS — Z9089 Acquired absence of other organs: Secondary | ICD-10-CM

## 2012-01-04 DIAGNOSIS — Z6835 Body mass index (BMI) 35.0-35.9, adult: Secondary | ICD-10-CM

## 2012-01-04 DIAGNOSIS — K929 Disease of digestive system, unspecified: Principal | ICD-10-CM | POA: Diagnosis present

## 2012-01-04 DIAGNOSIS — R1011 Right upper quadrant pain: Secondary | ICD-10-CM | POA: Diagnosis present

## 2012-01-04 DIAGNOSIS — F411 Generalized anxiety disorder: Secondary | ICD-10-CM | POA: Diagnosis present

## 2012-01-04 DIAGNOSIS — K8010 Calculus of gallbladder with chronic cholecystitis without obstruction: Secondary | ICD-10-CM

## 2012-01-04 DIAGNOSIS — E663 Overweight: Secondary | ICD-10-CM | POA: Diagnosis present

## 2012-01-04 DIAGNOSIS — Z79899 Other long term (current) drug therapy: Secondary | ICD-10-CM

## 2012-01-04 DIAGNOSIS — Y849 Medical procedure, unspecified as the cause of abnormal reaction of the patient, or of later complication, without mention of misadventure at the time of the procedure: Secondary | ICD-10-CM | POA: Diagnosis present

## 2012-01-04 DIAGNOSIS — K7689 Other specified diseases of liver: Secondary | ICD-10-CM | POA: Diagnosis present

## 2012-01-04 DIAGNOSIS — K838 Other specified diseases of biliary tract: Secondary | ICD-10-CM | POA: Diagnosis present

## 2012-01-04 LAB — CBC
MCV: 88.8 fL (ref 78.0–100.0)
Platelets: 309 10*3/uL (ref 150–400)
RBC: 4.37 MIL/uL (ref 3.87–5.11)
WBC: 12.6 10*3/uL — ABNORMAL HIGH (ref 4.0–10.5)

## 2012-01-04 LAB — COMPREHENSIVE METABOLIC PANEL
AST: 41 U/L — ABNORMAL HIGH (ref 0–37)
Alkaline Phosphatase: 111 U/L (ref 39–117)
BUN: 9 mg/dL (ref 6–23)
CO2: 20 mEq/L (ref 19–32)
Chloride: 102 mEq/L (ref 96–112)
Creatinine, Ser: 0.74 mg/dL (ref 0.50–1.10)
GFR calc non Af Amer: >90 mL/min (ref >90)
Potassium: 3.7 mEq/L (ref 3.5–5.1)
Total Bilirubin: 0.8 mg/dL (ref 0.3–1.2)

## 2012-01-04 LAB — DIFFERENTIAL
Basophils Absolute: 0 10*3/uL (ref 0.0–0.1)
Lymphocytes Relative: 15 % (ref 12–46)
Lymphs Abs: 1.9 10*3/uL (ref 0.7–4.0)
Neutro Abs: 9.1 10*3/uL — ABNORMAL HIGH (ref 1.7–7.7)
Neutrophils Relative %: 72 % (ref 43–77)

## 2012-01-04 LAB — LIPASE, BLOOD: Lipase: 25 U/L (ref 11–59)

## 2012-01-04 MED ORDER — ONDANSETRON HCL 4 MG/2ML IJ SOLN
4.0000 mg | Freq: Once | INTRAMUSCULAR | Status: AC
  Administered 2012-01-04: 4 mg via INTRAVENOUS

## 2012-01-04 MED ORDER — ACETAMINOPHEN 650 MG RE SUPP: 650.0000 mg | Freq: Four times a day (QID) | RECTAL | Status: DC | PRN

## 2012-01-04 MED ORDER — ENOXAPARIN SODIUM 40 MG/0.4ML ~~LOC~~ SOLN
40.0000 mg | SUBCUTANEOUS | Status: DC
  Administered 2012-01-04 – 2012-01-05 (×2): 40 mg via SUBCUTANEOUS
  Filled 2012-01-04 (×4): qty 0.4

## 2012-01-04 MED ORDER — PROMETHAZINE HCL 25 MG/ML IJ SOLN
12.5000 mg | Freq: Four times a day (QID) | INTRAMUSCULAR | Status: DC | PRN
  Administered 2012-01-04 – 2012-01-09 (×8): 12.5 mg via INTRAVENOUS
  Filled 2012-01-04 (×8): qty 1

## 2012-01-04 MED ORDER — ACETAMINOPHEN 325 MG PO TABS
650.0000 mg | ORAL_TABLET | Freq: Four times a day (QID) | ORAL | Status: DC | PRN
  Administered 2012-01-04 – 2012-01-07 (×5): 650 mg via ORAL
  Filled 2012-01-04 (×5): qty 2

## 2012-01-04 MED ORDER — PIPERACILLIN-TAZOBACTAM 3.375 G IVPB
3.3750 g | Freq: Three times a day (TID) | INTRAVENOUS | Status: DC
  Administered 2012-01-05 – 2012-01-09 (×12): 3.375 g via INTRAVENOUS
  Filled 2012-01-04 (×15): qty 50

## 2012-01-04 MED ORDER — POTASSIUM CHLORIDE IN NACL 20-0.9 MEQ/L-% IV SOLN
INTRAVENOUS | Status: DC
  Administered 2012-01-04 – 2012-01-08 (×9): via INTRAVENOUS
  Administered 2012-01-09: 100 mL via INTRAVENOUS
  Filled 2012-01-04 (×14): qty 1000

## 2012-01-04 MED ORDER — ONDANSETRON HCL 4 MG/2ML IJ SOLN
4.0000 mg | Freq: Four times a day (QID) | INTRAMUSCULAR | Status: DC | PRN
  Administered 2012-01-04 – 2012-01-08 (×5): 4 mg via INTRAVENOUS
  Filled 2012-01-04 (×5): qty 2

## 2012-01-04 MED ORDER — HYDROMORPHONE HCL PF 1 MG/ML IJ SOLN
1.0000 mg | INTRAMUSCULAR | Status: DC | PRN
  Administered 2012-01-04 – 2012-01-09 (×27): 1 mg via INTRAVENOUS
  Filled 2012-01-04 (×27): qty 1

## 2012-01-04 MED ORDER — SODIUM CHLORIDE 0.9 % IV BOLUS (SEPSIS)
500.0000 mL | Freq: Once | INTRAVENOUS | Status: AC
  Administered 2012-01-04: 500 mL via INTRAVENOUS

## 2012-01-04 MED ORDER — PANTOPRAZOLE SODIUM 40 MG IV SOLR
40.0000 mg | Freq: Every day | INTRAVENOUS | Status: DC
  Administered 2012-01-04 – 2012-01-07 (×4): 40 mg via INTRAVENOUS
  Filled 2012-01-04 (×6): qty 40

## 2012-01-04 MED ORDER — IOHEXOL 300 MG/ML  SOLN
100.0000 mL | Freq: Once | INTRAMUSCULAR | Status: AC | PRN
  Administered 2012-01-04: 100 mL via INTRAVENOUS

## 2012-01-04 MED ORDER — PIPERACILLIN-TAZOBACTAM 3.375 G IVPB
3.3750 g | INTRAVENOUS | Status: AC
  Administered 2012-01-04: 3.375 g via INTRAVENOUS
  Filled 2012-01-04: qty 50

## 2012-01-04 MED ORDER — HYDROMORPHONE HCL PF 1 MG/ML IJ SOLN
1.0000 mg | Freq: Once | INTRAMUSCULAR | Status: AC
  Administered 2012-01-04: 1 mg via INTRAVENOUS
  Filled 2012-01-04: qty 1

## 2012-01-04 MED ORDER — ALPRAZOLAM 0.25 MG PO TABS
0.2500 mg | ORAL_TABLET | Freq: Three times a day (TID) | ORAL | Status: DC | PRN
  Filled 2012-01-04 (×2): qty 1

## 2012-01-04 MED ORDER — MORPHINE SULFATE 2 MG/ML IJ SOLN: 1.0000 mg | INTRAMUSCULAR | Status: DC | PRN

## 2012-01-04 NOTE — Telephone Encounter (Signed)
Will, PA paged to call Patina @ [facility] ED re: order's [phone].

## 2012-01-04 NOTE — ED Provider Notes (Addendum)
History     CSN: [id number]  Arrival date & time 01/04/12  1137   First MD Initiated Contact with Patient 01/04/12 1246      Chief Complaint  Patient presents with  . Post-op Problem   Abdominal pain (Consider location/radiation/quality/duration/timing/severity/associated sxs/prior treatment) HPI  Patient complaining of abdominal pain. She states that she had her gallbladder out on April 30. She began having problems a week after that and was be admitted for several weeks. She had a biloma and required a drain to be placed. She is continued to have drainage from the site. She had been improving until yesterday when she began having increasing diffuse abdominal pain. Pain is crampy and severe present constantly since yesterday with loss of appetite associated. She spoke with her surgeon's office and was told to come to the emergency department to be evaluated by the surgeon on-call.  Past Medical History  Diagnosis Date  . Allergy   . Anxiety   . PONV (postoperative nausea and vomiting)   . Ligament tear     left; "all are torn"  . Headache     "every once in awhile"  . Postoperative bile leak   . Hepatic steatosis 02/24/11    Past Surgical History  Procedure Date  . Cesarean section 2000  . Appendectomy 2011  . Cholecystectomy 11/29/2011    Procedure: LAPAROSCOPIC CHOLECYSTECTOMY WITH INTRAOPERATIVE CHOLANGIOGRAM;  Surgeon: Wilmon Arms. Corliss Skains, MD;  Location: [facility] OR;  Service: General;  Laterality: N/A;  . Rlq abscess drain placement 12/12/11  . Ercp 12/14/2011    Procedure: ENDOSCOPIC RETROGRADE CHOLANGIOPANCREATOGRAPHY (ERCP);  Surgeon: Iva Boop, MD;  Location: [facility] OR;  Service: Endoscopy;  Laterality: N/A;    Family History  Problem Relation Age of Onset  . Lung cancer Maternal Grandmother   . Brain cancer Maternal Grandmother   . Anesthesia problems Neg Hx   . Colon cancer Neg Hx   . Diabetes Mother   . Heart failure Mother     History  Substance Use Topics  .  Smoking status: Never Smoker   . Smokeless tobacco: Never Used  . Alcohol Use: Yes     12/12/11 "maybe once/ monthly [place] iced teas"    OB History    Grav Para Term Preterm Abortions TAB SAB Ect Mult Living                  Review of Systems  All other systems reviewed and are negative.    Allergies  Ciprofloxacin  Home Medications   Current Outpatient Rx  Name Route Sig Dispense Refill  . ALPRAZOLAM 0.25 MG PO TABS Oral Take 0.25 mg by mouth 3 (three) times daily as needed. For anxiety.    . IBUPROFEN 200 MG PO TABS Oral Take 200 mg by mouth every 8 (eight) hours as needed. For pain.    . OXYCODONE-ACETAMINOPHEN 5-325 MG PO TABS Oral Take 1 tablet by mouth every 6 (six) hours as needed. For pain.    Marland Kitchen PROMETHAZINE HCL 12.5 MG PO TABS Oral Take 1 tablet (12.5 mg total) by mouth every 6 (six) hours as needed. For nausea 30 tablet 2    BP 117/69  Pulse 104  Temp(Src) 98.4 F (36.9 C) (Oral)  Resp 20  SpO2 97%  LMP 11/29/2011  Physical Exam  Nursing note and vitals reviewed. Constitutional: She appears well-developed and well-nourished.  HENT:  Head: Normocephalic and atraumatic.  Eyes: Conjunctivae and EOM are normal. Pupils are equal, round, and  reactive to light.  Neck: Normal range of motion. Neck supple.  Cardiovascular: Normal rate, regular rhythm, normal heart sounds and intact distal pulses.   Pulmonary/Chest: Effort normal and breath sounds normal.  Abdominal:       Diffuse tenderness with well healing incisions with drain in right upper quadrant with fluid which is yellowish in color draining from the drain appeared  Musculoskeletal: Normal range of motion.  Neurological: She is alert.  Skin: Skin is warm and dry.  Psychiatric: She has a normal mood and affect. Thought content normal.    ED Course  Procedures (including critical care time)  Labs Reviewed  CBC - Abnormal; Notable for the following:    WBC 12.6 (*)    All other components within  normal limits  DIFFERENTIAL - Abnormal; Notable for the following:    Neutro Abs 9.1 (*)    Monocytes Absolute 1.3 (*)    All other components within normal limits  COMPREHENSIVE METABOLIC PANEL  LIPASE, BLOOD   No results found.   No diagnosis found.    MDM    Patient's care discussed with Dr. Johna Sheriff.  He advised to order CT scan. He states they will evaluate the patient in the emergency department. Patient has been admitted to the surgery service.      Hilario Quarry, MD 01/04/12 1535  Hilario Quarry, MD 01/04/12 1536

## 2012-01-04 NOTE — ED Notes (Signed)
2nd attempt to call report to nurse on floor

## 2012-01-04 NOTE — ED Notes (Signed)
Patient reports that she had her gall bladder removed on 4/30 and a drainage tube placed 2 weeks ago. Patient is also having leakage around the drain site and is reporting a foul odor of drainage as well. Patient also c/o increased abdominal pain and nausea. Patient denies fever, diarrhea, or vomiting.

## 2012-01-04 NOTE — H&P (Signed)
Patient interviewed and examined, agree with PA note above. I have reviewed the patient's current and previous studies. She has continued to bile drainage but no evidence of accumulation in the abdomen. She may have some increased chemical irritation or possibly early infection in the gallbladder bed. We will admit for symptom control and start her on IV antibiotics. She has had ongoing bile leak after several weeks of drainage and I believe that she will need a repeat ERCP and stent exchange in case this is clogged. I had an extensive discussion with the patient and her family regarding plans. Mariella Saa MD, FACS  01/04/2012 8:05 PM

## 2012-01-04 NOTE — ED Notes (Signed)
3rd attempt to call report to nurse, who is the charge nurse on floor

## 2012-01-04 NOTE — H&P (Signed)
Karen Roberts is an 42 y.o. female.   Chief Complaint: Abdominal pain, nausea, odor to bile drainage. HPI: Patient is a 41 year old female who underwent cholecystectomy 11/29/11 by Dr. Corliss Skains. She returned on 12/12/2011 with abdominal pain and a bile leak. She underwent a percutaneous drain placement 12/12/2011. Followed by an ERCP 12/14/11, stent placement by Dr. Stan Head. She was discharged on 12/19/2011 she was seen in the ER on 12/22/2013 with concerns of  blood in the drain she was seen in the office on 12/29/2011, and was poor did be doing much better. She called our office today complaining of increased pain, and odor from her bowel drain. She was directed to the emergency room and [facility] for further treatment and evaluation. Currently she is hemodynamically stable in the ER. She complains of a sharp sharp pain in the right upper quadrant. He also notes there some leakage of bile around the drain, she says the pain is equivalent to what she had before the tube was inserted. She is concerned there is a new odor, and some sediment within the bile she is seeing in the drain. Last visit 12/29/11 Dr. Corliss Skains, she was doing well. MRCP 12/18/11 showed no bile leak.  Past Medical History  Diagnosis No medical illness prior to cholecystectomy Date  . Allergy   . Anxiety   . PONV (postoperative nausea and vomiting)   . Ligament tear     left; "all are torn"  . Headache     "every once in awhile"  . Postoperative bile leak   . Hepatic steatosis  BMI 35 02/24/11    Past Surgical History  Procedure Date  . Cesarean section 2000  . Appendectomy 2011  . Cholecystectomy 11/29/2011    Procedure: LAPAROSCOPIC CHOLECYSTECTOMY WITH INTRAOPERATIVE CHOLANGIOGRAM;  Surgeon: Wilmon Arms. Corliss Skains, MD;  Location: [facility] OR;  Service: General;  Laterality: N/A;  . Rlq abscess drain placement 12/12/11  . Ercp 12/14/2011    Procedure: ENDOSCOPIC RETROGRADE CHOLANGIOPANCREATOGRAPHY (ERCP);  Surgeon: Iva Boop, MD;  Location: [facility];  Service: Endoscopy;  Laterality: N/A;    Family History  Problem Relation Age of Onset  . Lung cancer Maternal Grandmother   . Brain cancer Maternal Grandmother   . Anesthesia problems Neg Hx   . Colon cancer Neg Hx   . Diabetes Mother   . Heart failure  Lumpectomy, path is unknown to Pt. Mother Father  Sister   Unknown   Social History:  reports that she has never smoked. She has never used smokeless tobacco. She reports that she drinks alcohol. She reports that she does not use illicit drugs. 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  Allergies:  Allergies  Allergen Reactions  . Ciprofloxacin Dermatitis    "dr's think it may have caused mastitis"   Prior to Admission medications   Medication Sig Start Date End Date Taking? Authorizing Provider  ALPRAZolam (XANAX) 0.25 MG tablet Take 0.25 mg by mouth 3 (three) times daily as needed. For anxiety.   Yes Historical Provider, MD  ibuprofen (ADVIL,MOTRIN) 200 MG tablet Take 200 mg by mouth every 8 (eight) hours as needed. For pain.   Yes Historical Provider, MD  oxyCODONE-acetaminophen (PERCOCET) 5-325 MG per tablet Take 1 tablet by mouth every 6 (six) hours as needed. For pain.   Yes Historical Provider, MD  promethazine (PHENERGAN) 12.5 MG tablet Take 1 tablet (12.5 mg total) by mouth every 6 (six) hours as needed. For nausea  12/29/11  Yes Wilmon Arms. Tsuei, MD     (Not in a hospital admission)  Results for orders placed during the hospital encounter of 01/04/12 (from the past 48 hour(s))  CBC     Status: Abnormal   Collection Time   01/04/12  1:30 PM      Component Value Range Comment   WBC 12.6 (*) 4.0 - 10.5 (K/uL)    RBC 4.37  3.87 - 5.11 (MIL/uL)    Hemoglobin 13.1  12.0 - 15.0 (g/dL)    HCT 16.1  09.6 - 04.5 (%)    MCV 88.8  78.0 - 100.0 (fL)    MCH 30.0  26.0 - 34.0 (pg)    MCHC 33.8  30.0 - 36.0 (g/dL)    RDW 40.9  81.1 - 91.4 (%)    Platelets 309  150 - 400 (K/uL)     DIFFERENTIAL     Status: Abnormal   Collection Time   01/04/12  1:30 PM      Component Value Range Comment   Neutrophils Relative 72  43 - 77 (%)    Neutro Abs 9.1 (*) 1.7 - 7.7 (K/uL)    Lymphocytes Relative 15  12 - 46 (%)    Lymphs Abs 1.9  0.7 - 4.0 (K/uL)    Monocytes Relative 10  3 - 12 (%)    Monocytes Absolute 1.3 (*) 0.1 - 1.0 (K/uL)    Eosinophils Relative 3  0 - 5 (%)    Eosinophils Absolute 0.3  0.0 - 0.7 (K/uL)    Basophils Relative 0  0 - 1 (%)    Basophils Absolute 0.0  0.0 - 0.1 (K/uL)    No results found.  Review of Systems  Constitutional: Positive for chills. Negative for fever, weight loss, malaise/fatigue and diaphoresis.  HENT: Negative.   Eyes: Negative.   Respiratory: Negative.   Gastrointestinal: Positive for heartburn (occasional ), nausea, abdominal pain (RUQ) and constipation (occasional). Negative for vomiting, diarrhea, blood in stool and melena.  Genitourinary: Negative.   Musculoskeletal: Negative.   Skin: Negative.   Neurological: Negative.  Negative for weakness.  Endo/Heme/Allergies: Negative.   Psychiatric/Behavioral: The patient is nervous/anxious.     Blood pressure 117/69, pulse 104, temperature 98.4 F (36.9 C), temperature source Oral, resp. rate 20, last menstrual period 11/29/2011, SpO2 97.00%. Physical Exam  Constitutional: She is oriented to person, place, and time. She appears well-developed and well-nourished.       Overweight, no acute distress but tearful.  HENT:  Head: Normocephalic and atraumatic.  Nose: Nose normal.  Eyes: Conjunctivae and EOM are normal. Pupils are equal, round, and reactive to light. Right eye exhibits no discharge. Left eye exhibits no discharge. No scleral icterus.  Neck: Normal range of motion. Neck supple. No JVD present. No tracheal deviation present. No thyromegaly present.  Cardiovascular: Normal rate, regular rhythm, normal heart sounds and intact distal pulses.  Exam reveals no gallop.   No  murmur heard. Respiratory: Effort normal and breath sounds normal. No stridor. No respiratory distress. She has no wheezes. She has no rales. She exhibits no tenderness.  GI: Soft. Bowel sounds are normal. She exhibits no distension and no mass. There is tenderness (RUQ). There is no rebound and no guarding.  Musculoskeletal: Normal range of motion. She exhibits no edema.  Lymphadenopathy:    She has no cervical adenopathy.  Neurological: She is alert and oriented to person, place, and time. No cranial nerve deficit.  Skin: Skin is warm  and dry. No rash noted. No erythema. No pallor.  Psychiatric: Her behavior is normal. Judgment and thought content normal.       She very anxious and tearful talking about illness.     Assessment/Plan 1. Abdominal pain, status post cholecystectomy 12/12/2011. 2. Postop bile leak, percutaneous drain placement 12/12/2011; status post ERCP and stent placement Dr. Stan Head 12/14/11. MRCP, 12/18/11 with no leak noted. 3. Anxiety 4. History of some headaches. 5. BMI of 35  Plan: We'll admit the patient white count so far is mildly elevated at 12,600. We will culture her bile drainage. Labs and CT are pending.  [facility] physician assistant for Dr. Glenna Fellows.    Karen Roberts 01/04/2012, 1:54 PM

## 2012-01-04 NOTE — Telephone Encounter (Signed)
Patient called office complaining of RUQ pain & nausea, patient states she has a drain in place and reports drainnage has a foul smelling odor output of 40 cc's q3hrs, patient denies fevers.  (Dr. Corliss Skains -not available) paged Dr. Magnus Ivan to discuss bringing patient into Urgent Office. Per Dr. Magnus Ivan- patient to go to ED for further workup due to recent post surgical bile leak w/JP drain in place. (s/p lap chole w/IOC 11/29/11)

## 2012-01-05 ENCOUNTER — Observation Stay (HOSPITAL_COMMUNITY): Payer: 59 | Admitting: Anesthesiology

## 2012-01-05 ENCOUNTER — Encounter (INDEPENDENT_AMBULATORY_CARE_PROVIDER_SITE_OTHER): Payer: 59 | Admitting: Surgery

## 2012-01-05 ENCOUNTER — Encounter (HOSPITAL_COMMUNITY)
Admission: EM | Disposition: A | Discharge status: Home / Self Care | Payer: Self-pay | Source: Home / Self Care | Attending: Surgery

## 2012-01-05 ENCOUNTER — Encounter (HOSPITAL_COMMUNITY): Payer: Self-pay | Admitting: Anesthesiology

## 2012-01-05 ENCOUNTER — Observation Stay (HOSPITAL_COMMUNITY): Payer: 59

## 2012-01-05 DIAGNOSIS — K838 Other specified diseases of biliary tract: Secondary | ICD-10-CM

## 2012-01-05 DIAGNOSIS — K929 Disease of digestive system, unspecified: Principal | ICD-10-CM

## 2012-01-05 HISTORY — PX: ERCP: SHX5425

## 2012-01-05 LAB — CBC
MCHC: 32.4 g/dL (ref 30.0–36.0)
MCV: 89.5 fL (ref 78.0–100.0)
Platelets: 230 10*3/uL (ref 150–400)
RDW: 14.6 % (ref 11.5–15.5)
WBC: 10.9 10*3/uL — ABNORMAL HIGH (ref 4.0–10.5)

## 2012-01-05 LAB — COMPREHENSIVE METABOLIC PANEL
AST: 28 U/L (ref 0–37)
Albumin: 3.1 g/dL — ABNORMAL LOW (ref 3.5–5.2)
BUN: 6 mg/dL (ref 6–23)
Chloride: 102 mEq/L (ref 96–112)
Creatinine, Ser: 0.86 mg/dL (ref 0.50–1.10)
Total Bilirubin: 1.4 mg/dL — ABNORMAL HIGH (ref 0.3–1.2)
Total Protein: 6.2 g/dL (ref 6.0–8.3)

## 2012-01-05 LAB — MAGNESIUM: Magnesium: 2 mg/dL (ref 1.5–2.5)

## 2012-01-05 SURGERY — ERCP, WITH INTERVENTION IF INDICATED
Anesthesia: General

## 2012-01-05 MED ORDER — DEXAMETHASONE SODIUM PHOSPHATE 10 MG/ML IJ SOLN
INTRAMUSCULAR | Status: DC | PRN
  Administered 2012-01-05: 10 mg via INTRAVENOUS

## 2012-01-05 MED ORDER — FENTANYL CITRATE 0.05 MG/ML IJ SOLN
INTRAMUSCULAR | Status: DC | PRN
  Administered 2012-01-05: 100 ug via INTRAVENOUS

## 2012-01-05 MED ORDER — PROMETHAZINE HCL 25 MG/ML IJ SOLN
6.2500 mg | INTRAMUSCULAR | Status: DC | PRN
  Administered 2012-01-05: 6.25 mg via INTRAVENOUS

## 2012-01-05 MED ORDER — ONDANSETRON HCL 4 MG/2ML IJ SOLN
INTRAMUSCULAR | Status: DC | PRN
  Administered 2012-01-05: 4 mg via INTRAVENOUS

## 2012-01-05 MED ORDER — LACTATED RINGERS IV SOLN
INTRAVENOUS | Status: DC | PRN
  Administered 2012-01-05: 15:00:00 via INTRAVENOUS

## 2012-01-05 MED ORDER — SUCCINYLCHOLINE CHLORIDE 20 MG/ML IJ SOLN
INTRAMUSCULAR | Status: DC | PRN
  Administered 2012-01-05: 100 mg via INTRAVENOUS

## 2012-01-05 MED ORDER — IOHEXOL 300 MG/ML  SOLN
INTRAMUSCULAR | Status: DC | PRN
  Administered 2012-01-05: 40 mL via ORAL

## 2012-01-05 MED ORDER — KETOROLAC TROMETHAMINE 30 MG/ML IJ SOLN: 15.0000 mg | Freq: Once | INTRAMUSCULAR | Status: DC | PRN

## 2012-01-05 MED ORDER — MIDAZOLAM HCL 5 MG/5ML IJ SOLN
INTRAMUSCULAR | Status: DC | PRN
  Administered 2012-01-05: 2 mg via INTRAVENOUS

## 2012-01-05 MED ORDER — LIDOCAINE HCL (CARDIAC) 20 MG/ML IV SOLN
INTRAVENOUS | Status: DC | PRN
  Administered 2012-01-05: 50 mg via INTRAVENOUS

## 2012-01-05 MED ORDER — PROPOFOL 10 MG/ML IV BOLUS
INTRAVENOUS | Status: DC | PRN
  Administered 2012-01-05: 170 mg via INTRAVENOUS

## 2012-01-05 MED ORDER — FENTANYL CITRATE 0.05 MG/ML IJ SOLN: 25.0000 ug | INTRAMUSCULAR | Status: DC | PRN

## 2012-01-05 NOTE — Transfer of Care (Signed)
Immediate Anesthesia Transfer of Care Note  Patient: Karen Roberts  Procedure(s) Performed: Procedure(s) (LRB): ENDOSCOPIC RETROGRADE CHOLANGIOPANCREATOGRAPHY (ERCP) (N/A)  Patient Location: PACU  Anesthesia Type: General  Level of Consciousness: sedated  Airway & Oxygen Therapy: Patient Spontanous Breathing and Patient connected to face mask oxygen  Post-op Assessment: Report given to PACU RN and Post -op Vital signs reviewed and stable  Post vital signs: Reviewed and stable  Complications: No apparent anesthesia complications

## 2012-01-05 NOTE — Consult Note (Signed)
[facility] Consultation  Referring Provider: Stevan Born, MD Primary Care Physician:  Carollee Herter, MD, MD Primary Gastroenterologist:   Stan Head, MD Reason for Consultation:  persistent bile leak  HPI: Karen Roberts is a 41 y.o. female hospitalized in mid May with a post lap chole bile leak. She had a percutaneous drain placed after ultrasound showed fluid collection around the liver and in the gallbladder fossa  Following that patient underwent ERCP with stent placement by  Dr. Leone Payor. A bile leak was not visualized at time of the ERCP but drain output didn't decrease following ERCP. For further evaluation an MRCP was done, no abnormal findings though stent was no visualized. A follow up KUB did show the stent however. Biliary drainage eventually decreased and patient was discharged on 12/19/11. At discharge Alk phos was 161, total bili 0.6.  Plan was to remove stent in 6 weeks. Patient was doing okay at follow up visit with surgeon 12/29/11. She has been averaging 40cc output every 3 hours. Plan was to leave drain in and reassess her in  another week. Yesterday patient called surgeon with RUQ pain, nausea, vomiting and malodorous biliary drainage. The pain feels like when she was diagnosed with fluid collection. She has chills but denies fever. Pain mainly RUQ and is exacerbated by any movement involving the area. Patient was sent to ED where.  CT scan shows some inflammatory changes in gallbladder bed.Stent is still present. Her bili is 1.4. She is afebrile but has chills and WBC 12.6 on admission.     Past Medical History  Diagnosis Date  . Allergy   . Anxiety   . PONV (postoperative nausea and vomiting)   . Ligament tear     left; "all are torn"  . Headache     "every once in awhile"  . Postoperative bile leak   . Hepatic steatosis 02/24/11    Past Surgical History  Procedure Date  . Cesarean section 2000  . Appendectomy 2011  . Cholecystectomy  11/29/2011    Procedure: LAPAROSCOPIC CHOLECYSTECTOMY WITH INTRAOPERATIVE CHOLANGIOGRAM;  Surgeon: Wilmon Arms. Corliss Skains, MD;  Location: [facility] OR;  Service: General;  Laterality: N/A;  . Rlq abscess drain placement 12/12/11  . Ercp 12/14/2011    Procedure: ENDOSCOPIC RETROGRADE CHOLANGIOPANCREATOGRAPHY (ERCP);  Surgeon: Iva Boop, MD;  Location: [facility] OR;  Service: Endoscopy;  Laterality: N/A;    Prior to Admission medications   Medication Sig Start Date End Date Taking? Authorizing Provider  ALPRAZolam (XANAX) 0.25 MG tablet Take 0.25 mg by mouth 3 (three) times daily as needed. For anxiety.   Yes Historical Provider, MD  ibuprofen (ADVIL,MOTRIN) 200 MG tablet Take 200 mg by mouth every 8 (eight) hours as needed. For pain.   Yes Historical Provider, MD  oxyCODONE-acetaminophen (PERCOCET) 5-325 MG per tablet Take 1 tablet by mouth every 6 (six) hours as needed. For pain.   Yes Historical Provider, MD  promethazine (PHENERGAN) 12.5 MG tablet Take 1 tablet (12.5 mg total) by mouth every 6 (six) hours as needed. For nausea 12/29/11  Yes Wilmon Arms. Corliss Skains, MD    Current Facility-Administered Medications  Medication Dose Route Frequency Provider Last Rate Last Dose  . 0.9 % NaCl with KCl 20 mEq/ L  infusion   Intravenous Continuous Sherrie George, PA 100 mL/hr at 01/05/12 0512    . acetaminophen (TYLENOL) tablet 650 mg  650 mg Oral Q6H PRN Sherrie George, PA   650 mg at 01/05/12 0834   Or  . acetaminophen (  TYLENOL) suppository 650 mg  650 mg Rectal Q6H PRN Sherrie George, PA      . ALPRAZolam Prudy Feeler) tablet 0.25 mg  0.25 mg Oral TID PRN Sherrie George, PA      . enoxaparin (LOVENOX) injection 40 mg  40 mg Subcutaneous Q24H Sherrie George, [place]   40 mg at 01/04/12 2146  . HYDROmorphone (DILAUDID) injection 1 mg  1 mg Intravenous Once Hilario Quarry, MD   1 mg at 01/04/12 1321  . HYDROmorphone (DILAUDID) injection 1 mg  1 mg Intravenous Q1H PRN Sherrie George, PA   1 mg at 01/05/12 0817  . iohexol  (OMNIPAQUE) 300 MG/ML solution 100 mL  100 mL Intravenous Once PRN Medication Radiologist, MD   100 mL at 01/04/12 1618  . ondansetron (ZOFRAN) injection 4 mg  4 mg Intravenous Once Hilario Quarry, MD   4 mg at 01/04/12 1321  . ondansetron (ZOFRAN) injection 4 mg  4 mg Intravenous Q6H PRN Sherrie George, PA   4 mg at 01/04/12 2002  . pantoprazole (PROTONIX) injection 40 mg  40 mg Intravenous QHS Sherrie George, [place]   40 mg at 01/04/12 2146  . piperacillin-tazobactam (ZOSYN) IVPB 3.375 g  3.375 g Intravenous Q8H Sherrie George, [place]   3.375 g at 01/05/12 0816  . piperacillin-tazobactam (ZOSYN) IVPB 3.375 g  3.375 g Intravenous NOW Sherrie George, PA   3.375 g at 01/04/12 1553  . promethazine (PHENERGAN) injection 12.5 mg  12.5 mg Intravenous Q6H PRN Sherrie George, PA   12.5 mg at 01/05/12 0817  . sodium chloride 0.9 % bolus 500 mL  500 mL Intravenous Once Hilario Quarry, MD   500 mL at 01/04/12 1325  . DISCONTD: morphine 2 MG/ML injection 1-4 mg  1-4 mg Intravenous Q1H PRN Sherrie George, PA        Allergies as of 01/04/2012 - Review Complete 01/04/2012  Allergen Reaction Noted  . Ciprofloxacin Dermatitis 11/29/2011  . Morphine and related Nausea Only 01/04/2012    Family History  Problem Relation Age of Onset  . Lung cancer Maternal Grandmother   . Brain cancer Maternal Grandmother   . Anesthesia problems Neg Hx   . Colon cancer Neg Hx   . Diabetes Mother   . Heart failure Mother     History   Social History  . Marital Status: Married    Spouse Name: N/A    Number of Children: N/A  . Years of Education: N/A   Occupational History  . Not on file.   Social History Main Topics  . Smoking status: Never Smoker   . Smokeless tobacco: Never Used  . Alcohol Use: Yes     12/12/11 "maybe once/ monthly [place] iced teas"  . Drug Use: No  . Sexually Active: Yes    Review of Systems: All systems reviewed and negative except where noted in HPI.  PHYSICAL EXAM: Vital  signs in last 24 hours: Temp:  [97.8 F (36.6 C)-98.4 F (36.9 C)] 98.1 F (36.7 C) (06/06 0459) Pulse Rate:  [76-107] 79  (06/06 0459) Resp:  [17-22] 18  (06/06 0459) BP: (95-117)/(57-74) 100/67 mmHg (06/06 0459) SpO2:  [96 %-99 %] 97 % (06/06 0459) Weight:  [197 lb 12 oz (89.7 kg)] 197 lb 12 oz (89.7 kg) (06/05 2034) Last BM Date: 01/04/12 General:   Pleasant white female in NAD Head:  Normocephalic and atraumatic. Eyes:   No icterus.   Conjunctiva pink. Ears:  Normal auditory acuity. Neck:  Supple; no  masses felt Lungs:  Respirations even and unlabored. Cannot inhale deeply secondary to pain in RUQ.  Heart:  Regular rate and rhythm; no murmurs heard. Abdomen:  Soft, nondistended, moderate RUQ tenderness.  Hypoactive bowel sounds.   Msk:  Symmetrical without gross deformities.  Extremities:  Without edema. Neurologic:  Alert and  oriented   grossly normal neurologically. Skin:  Intact without significant lesions or rashes. Cervical Nodes:  No significant cervical adenopathy. Psych:  Alert and cooperative. Normal affect.  LAB RESULTS:  Basename 01/05/12 0435 01/04/12 1330  WBC 10.9* 12.6*  HGB 10.8* 13.1  HCT 33.3* 38.8  PLT 230 309   BMET  Basename 01/05/12 0435 01/04/12 1625  NA 135 133*  K 4.1 3.7  CL 102 102  CO2 24 20  GLUCOSE 90 95  BUN 6 9  CREATININE 0.86 0.74  CALCIUM 8.9 8.6   LFT  Basename 01/05/12 0435  PROT 6.2  ALBUMIN 3.1*  AST 28  ALT 84*  ALKPHOS 105  BILITOT 1.4*  BILIDIR --  IBILI --    STUDIES: Ct Abdomen Pelvis W Contrast  01/04/2012  *RADIOLOGY REPORT*  Clinical Data: Abdominal pain  CT ABDOMEN AND PELVIS WITH CONTRAST  Technique:  Multidetector CT imaging of the abdomen and pelvis was performed following the standard protocol during bolus administration of intravenous contrast.  Contrast: OMNIPAQUE IOHEXOL 300 MG/ML  SOLN  Comparison: 12/04/2011  Findings: Pigtail catheter has been placed into the gallbladder fossa fluid  collection via S side hepatic approach.  The fluid collection has almost completely resolved.  Minimal stranding in the peroneal fat about the gallbladder fossa is present.  Common bile duct stent has been placed from the porta hepatis to the duodenum.  Post cholecystectomy.  Kidneys, adrenal glands, spleen are stable in appearance.  Pancreas is unremarkable.  No new abscess.  Trace free fluid is seen in the pelvis.  Uterus, bladder, and adnexa are unremarkable.  Visualized appendix.  Lumbar degenerative disc disease.  Dependent atelectasis bilaterally.  IMPRESSION: Interval gallbladder fossa drain placement with near complete resolution of the gallbladder fossa fluid collection.  Inflammatory changes in the adjacent fat are present which may represent chemical peritonitis bile leak.  No new abscess.  Original Report Authenticated By: Donavan Burnet, M.D.     PREVIOUS ENDOSCOPIES: ERCP May 2013, see HPI  IMPRESSION / PLAN: Persistent post-op bile leak, despite CBD stent. No obvious fluid collection on CT scan. Now with some chills, mildly elevated WBC and complaints of malodorous biliary drainage around percutaneous drain. Patient will need repeat ERCP (?stent change). Procedure will be done by Dr. Leone Payor today. Keep NPO. Continue antibiotics.       Thanks   LOS: 1 day   Willette Cluster  01/05/2012, 9:27 AM    [facility] Attending  I have also seen and assessed the patient at about 1300 today, images viewed and agree with the above note.  Iva Boop, MD, [facility] [phone] (pager) 01/05/2012 3:51 PM

## 2012-01-05 NOTE — Op Note (Signed)
[facility] [address] [place], [place]  [address]  ERCP PROCEDURE REPORT  PATIENT:  Karen, Roberts  MR#:  [id number] BIRTHDATE:  Dec 13, 1970  GENDER:  female  ENDOSCOPIST:  Iva Boop, MD, [facility] ASSISTANT:  Namon Cirri, RN CGRN  PROCEDURE DATE:  01/05/2012 PROCEDURE:  ERCP with stent change ASA CLASS:  Class II  INDICATIONS:  persistent bile leak, despite stenting  MEDICATIONS:   See Anesthesia Report. TOPICAL ANESTHETIC:  none  DESCRIPTION OF PROCEDURE:   After the risks benefits and alternatives of the procedure were thoroughly explained, informed consent was obtained.  The Pentax ERCP [id number] endoscope was introduced through the mouth and advanced to the second portion of the duodenum. The esophagus was not seen well. The stomach and duodenum looked normal, bilious fluid seen. The stent was in place in the major papilla and draining some bile. The existing stent was removed using a snare, and the bile duct was cannulated. A balloon was inserted and occlusion cholangiogram performed. entire biliary tree filled out.  No leak or other abnormality was seen. The indwelling drain was coiled in the gallbladder fossa. S/p cholecystectomy with cystic duct stump intact. Then, a 5 cm 10 french stent was placed into the common bile duct and drained well. Pancreatogram not attempted.  The scope was then completely withdrawn from the patient and the procedure terminated. <<PROCEDUREIMAGES>>  COMPLICATIONS:  None  ENDOSCOPIC IMPRESSION: 1) Normal post-cholecystectomy cholangiogram. Successful stent change. No leak identified (again).  RECOMMENDATIONS: 1) I will review final images and make other recommendations. 2) Consider HIDA if drain output stays elevated. 3) ? if possible that drain is irritating liver surface and causing drainage.  Iva Boop, MD, [facility]  n. eSIGNED:   Iva Boop at 01/05/2012 05:09  PM  [name], [phone]

## 2012-01-05 NOTE — Progress Notes (Signed)
INITIAL ADULT NUTRITION ASSESSMENT Date: 01/05/2012   Time: 10:44 AM Reason for Assessment: Nutrition risk   ASSESSMENT: Female 41 y.o.  Dx: Abdominal pain, nausea, odor to bile drainage   Food/Nutrition Related Hx: Pt with history of post laparotomy cholecystectomy bile leak with percutaneous drain placement and ERCP with stent placement and MRCP. Prior to admission pt reported 40ml output from drain every 3 hours. Pt reports eating 2-3 meals/day PTA, no changes in appetite. Pt reports abdominal pain worsening in the past day, not affecting intake PTA. Pt with 16 pound unintended weight loss since last admission which pt thinks is r/t being hospitalized. Pt reports vomiting yesterday, however nausea under control today and no vomiting today.   CT of abdomen/pelvis yesterday showed: Interval gallbladder fossa drain placement with near complete  resolution of the gallbladder fossa fluid collection. Inflammatory  changes in the adjacent fat are present which may represent  chemical peritonitis bile leak. No new abscess.   Hx:  Past Medical History  Diagnosis Date  . Allergy   . Anxiety   . PONV (postoperative nausea and vomiting)   . Ligament tear     left; "all are torn"  . Headache     "every once in awhile"  . Postoperative bile leak   . Hepatic steatosis 02/24/11   Related Meds: Scheduled Meds:   . enoxaparin  40 mg Subcutaneous Q24H  .  HYDROmorphone (DILAUDID) injection  1 mg Intravenous Once  . ondansetron  4 mg Intravenous Once  . pantoprazole (PROTONIX) IV  40 mg Intravenous QHS  . piperacillin-tazobactam (ZOSYN)  IV  3.375 g Intravenous Q8H  . piperacillin-tazobactam (ZOSYN)  IV  3.375 g Intravenous NOW  . sodium chloride  500 mL Intravenous Once   Continuous Infusions:   . 0.9 % NaCl with KCl 20 mEq / L 100 mL/hr at 01/05/12 0512   PRN Meds:.acetaminophen, acetaminophen, ALPRAZolam, HYDROmorphone (DILAUDID) injection, iohexol, ondansetron, promethazine, DISCONTD:   morphine injection  Ht: 5\' 6"  (167.6 cm)  Wt: 197 lb 12 oz (89.7 kg)  Ideal Wt: 130 lb % Ideal Wt: 151  Usual Wt: 213 lb % Usual Wt: 92  Body mass index is 31.92 kg/(m^2).   Class I obesity    Labs:  CMP     Component Value Date/Time   NA 135 01/05/2012 0435   K 4.1 01/05/2012 0435   CL 102 01/05/2012 0435   CO2 24 01/05/2012 0435   GLUCOSE 90 01/05/2012 0435   BUN 6 01/05/2012 0435   CREATININE 0.86 01/05/2012 0435   CREATININE 0.91 12/21/2011 1111   CALCIUM 8.9 01/05/2012 0435   PROT 6.2 01/05/2012 0435   ALBUMIN 3.1* 01/05/2012 0435   AST 28 01/05/2012 0435   ALT 84* 01/05/2012 0435   ALKPHOS 105 01/05/2012 0435   BILITOT 1.4* 01/05/2012 0435   GFRNONAA 82* 01/05/2012 0435   GFRAA >90 01/05/2012 0435    Intake/Output Summary (Last 24 hours) at 01/05/12 1050 Last data filed at 01/05/12 0600  Gross per 24 hour  Intake 1461.67 ml  Output    235 ml  Net 1226.67 ml   Last BM - 01/04/12  Biliary drain - output total for yesterday  Diet Order: NPO   IVF:    0.9 % NaCl with KCl 20 mEq / L Last Rate: 100 mL/hr at 01/05/12 0512    Estimated Nutritional Needs:   Kcal:1475-1800 Protein:60-75g Fluid:1.4-1.8L  NUTRITION DIAGNOSIS: -Inadequate oral intake (NI-2.1).  Status: Ongoing  RELATED TO: abdominal pain,  nausea  AS EVIDENCE BY: NPO  MONITORING/EVALUATION(Goals): Advance diet as tolerated to low fat diet.   EDUCATION NEEDS: -No education needs identified at this time  INTERVENTION: Diet advancement per MD. Will monitor.   Dietitian #: [phone]  DOCUMENTATION CODES Per approved criteria  -Obesity Unspecified    Karen Roberts 01/05/2012, 10:44 AM

## 2012-01-05 NOTE — Progress Notes (Signed)
Subjective: A little bit more comfortable, less nausea; still complains of some RUQ pain   Objective: Vital signs in last 24 hours: Temp:  [97.8 F (36.6 C)-98.4 F (36.9 C)] 98.1 F (36.7 C) (06/06 0459) Pulse Rate:  [76-107] 79  (06/06 0459) Resp:  [17-22] 18  (06/06 0459) BP: (95-117)/(57-74) 100/67 mmHg (06/06 0459) SpO2:  [96 %-99 %] 97 % (06/06 0459) Weight:  [197 lb 12 oz (89.7 kg)] 197 lb 12 oz (89.7 kg) (06/05 2034)    Intake/Output from previous day: 06/05 0701 - 06/06 0700 In: 1461.7 [I.V.:1411.7; IV Piggyback:50] Out: 235 [Drains:125] Intake/Output this shift:    General appearance: alert, cooperative and no distress GI: mild RUQ tenderness;  Incisions well-healed; drain site clear; drain - clear bile  Lab Results:   Basename 01/05/12 0435 01/04/12 1330  WBC 10.9* 12.6*  HGB 10.8* 13.1  HCT 33.3* 38.8  PLT 230 309   BMET  Basename 01/05/12 0435 01/04/12 1625  NA 135 133*  K 4.1 3.7  CL 102 102  CO2 24 20  GLUCOSE 90 95  BUN 6 9  CREATININE 0.86 0.74  CALCIUM 8.9 8.6   PT/INR No results found for this basename: LABPROT:2,INR:2 in the last 72 hours ABG No results found for this basename: PHART:2,PCO2:2,PO2:2,HCO3:2 in the last 72 hours  Studies/Results: Ct Abdomen Pelvis W Contrast  01/04/2012  *RADIOLOGY REPORT*  Clinical Data: Abdominal pain  CT ABDOMEN AND PELVIS WITH CONTRAST  Technique:  Multidetector CT imaging of the abdomen and pelvis was performed following the standard protocol during bolus administration of intravenous contrast.  Contrast: OMNIPAQUE IOHEXOL 300 MG/ML  SOLN  Comparison: 12/04/2011  Findings: Pigtail catheter has been placed into the gallbladder fossa fluid collection via S side hepatic approach.  The fluid collection has almost completely resolved.  Minimal stranding in the peroneal fat about the gallbladder fossa is present.  Common bile duct stent has been placed from the porta hepatis to the duodenum.  Post  cholecystectomy.  Kidneys, adrenal glands, spleen are stable in appearance.  Pancreas is unremarkable.  No new abscess.  Trace free fluid is seen in the pelvis.  Uterus, bladder, and adnexa are unremarkable.  Visualized appendix.  Lumbar degenerative disc disease.  Dependent atelectasis bilaterally.  IMPRESSION: Interval gallbladder fossa drain placement with near complete resolution of the gallbladder fossa fluid collection.  Inflammatory changes in the adjacent fat are present which may represent chemical peritonitis bile leak.  No new abscess.  Original Report Authenticated By: Donavan Burnet, M.D.    Anti-infectives: Anti-infectives     Start     Dose/Rate Route Frequency Ordered Stop   01/05/12 0000   piperacillin-tazobactam (ZOSYN) IVPB 3.375 g        3.375 g 12.5 mL/hr over 240 Minutes Intravenous Every 8 hours 01/04/12 1419     01/04/12 1430   piperacillin-tazobactam (ZOSYN) IVPB 3.375 g        3.375 g 100 mL/hr over 30 Minutes Intravenous NOW 01/04/12 1421 01/04/12 1623          Assessment/Plan:  Persistent bile leak s/p lap chole 11/29/11, but no sign of cystic duct leak; this likely represents a duct of Luschka leak S/p ERCP with stent placement, but drainage persists.  No fluid collection noted on CT scan. Slight inflammation in surrounding tissue on CT scan Continue antibiotics Will consult Dr. Leone Payor - [facility] - ?repeat MRCP vs. ERCP with stent change    LOS: 1 day  [place] Reliford K. 01/05/2012

## 2012-01-05 NOTE — Anesthesia Postprocedure Evaluation (Signed)
  Anesthesia Post-op Note  Patient: Karen Roberts  Procedure(s) Performed: Procedure(s) (LRB): ENDOSCOPIC RETROGRADE CHOLANGIOPANCREATOGRAPHY (ERCP) (N/A)  Patient Location: PACU  Anesthesia Type: General  Level of Consciousness: awake and alert   Airway and Oxygen Therapy: Patient Spontanous Breathing  Post-op Pain: mild  Post-op Assessment: Post-op Vital signs reviewed, Patient's Cardiovascular Status Stable, Respiratory Function Stable, Patent Airway and No signs of Nausea or vomiting  Post-op Vital Signs: stable  Complications: No apparent anesthesia complications

## 2012-01-05 NOTE — Progress Notes (Signed)
Reviewed films with Dr. Deanne Coffer and discussed case with Dr. Corliss Skains.  Will check bilirubin on drain fluid.  Consider injection of drainage catheter in IR. HIDA not thought to be helpful per Dr. Deanne Coffer. I ordered bilirubin on drain fluid.  May need laparoscopy.

## 2012-01-05 NOTE — Anesthesia Preprocedure Evaluation (Signed)
Anesthesia Evaluation  Patient identified by MRN, date of birth, ID band Patient awake    Reviewed: Allergy & Precautions, H&P , NPO status , Patient's Chart, lab work & pertinent test results  History of Anesthesia Complications (+) PONV  Airway Mallampati: II TM Distance: >3 FB Neck ROM: Full    Dental No notable dental hx.    Pulmonary neg pulmonary ROS,  breath sounds clear to auscultation  Pulmonary exam normal       Cardiovascular negative cardio ROS  Rhythm:Regular Rate:Normal     Neuro/Psych negative neurological ROS  negative psych ROS   GI/Hepatic negative GI ROS, Neg liver ROS,   Endo/Other  negative endocrine ROS  Renal/GU negative Renal ROS  negative genitourinary   Musculoskeletal negative musculoskeletal ROS (+)   Abdominal   Peds negative pediatric ROS (+)  Hematology negative hematology ROS (+)   Anesthesia Other Findings   Reproductive/Obstetrics negative OB ROS                           Anesthesia Physical Anesthesia Plan  ASA: II  Anesthesia Plan: General   Post-op Pain Management:    Induction: Intravenous  Airway Management Planned: Oral ETT  Additional Equipment:   Intra-op Plan:   Post-operative Plan: Extubation in OR  Informed Consent: I have reviewed the patients History and Physical, chart, labs and discussed the procedure including the risks, benefits and alternatives for the proposed anesthesia with the patient or authorized representative who has indicated his/her understanding and acceptance.   Dental advisory given  Plan Discussed with: CRNA  Anesthesia Plan Comments:         Anesthesia Quick Evaluation  

## 2012-01-06 ENCOUNTER — Observation Stay (HOSPITAL_COMMUNITY): Payer: 59

## 2012-01-06 ENCOUNTER — Encounter (HOSPITAL_COMMUNITY): Payer: Self-pay | Admitting: Internal Medicine

## 2012-01-06 ENCOUNTER — Ambulatory Visit: Payer: 59 | Admitting: Internal Medicine

## 2012-01-06 DIAGNOSIS — K8010 Calculus of gallbladder with chronic cholecystitis without obstruction: Secondary | ICD-10-CM

## 2012-01-06 MED ORDER — LIP MEDEX EX OINT
1.0000 "application " | TOPICAL_OINTMENT | Freq: Two times a day (BID) | CUTANEOUS | Status: DC
  Administered 2012-01-07 – 2012-01-08 (×5): 1 via TOPICAL
  Filled 2012-01-06 (×2): qty 7

## 2012-01-06 MED ORDER — MAGIC MOUTHWASH
15.0000 mL | Freq: Four times a day (QID) | ORAL | Status: DC | PRN
  Filled 2012-01-06: qty 15

## 2012-01-06 MED ORDER — IOHEXOL 300 MG/ML  SOLN
10.0000 mL | Freq: Once | INTRAMUSCULAR | Status: AC | PRN
  Administered 2012-01-06: 10 mL

## 2012-01-06 MED ORDER — LIDOCAINE HCL 1 % IJ SOLN
INTRAMUSCULAR | Status: AC
  Filled 2012-01-06: qty 20

## 2012-01-06 MED ORDER — MENTHOL 3 MG MT LOZG
1.0000 | LOZENGE | OROMUCOSAL | Status: DC | PRN
Start: 2012-01-06 — End: 2012-01-09
  Filled 2012-01-06: qty 9

## 2012-01-06 MED ORDER — PHENOL 1.4 % MT LIQD
2.0000 | OROMUCOSAL | Status: DC | PRN
  Filled 2012-01-06: qty 177

## 2012-01-06 NOTE — Progress Notes (Signed)
Gastroenterology Progress Note  SUBJECTIVE: Just back from IR.  OBJECTIVE:  Vital signs in last 24 hours: Temp:  [97.5 F (36.4 C)-98 F (36.7 C)] 98 F (36.7 C) (06/07 0542) Pulse Rate:  [65-93] 82  (06/07 0542) Resp:  [16-24] 18  (06/07 0542) BP: (98-112)/(59-78) 107/65 mmHg (06/07 0542) SpO2:  [93 %-100 %] 97 % (06/07 0542) Last BM Date: 01/04/12 General:    white female in NAD Abdomen:  Soft, nontender and nondistended. Normal bowel sounds. Neurologic:  Alert and oriented,  grossly normal neurologically. Psych:  Pleasant but emotional today   Lab Results:  Basename 01/05/12 0435 01/04/12 1330  WBC 10.9* 12.6*  HGB 10.8* 13.1  HCT 33.3* 38.8  PLT 230 309   BMET  Basename 01/05/12 0435 01/04/12 1625  NA 135 133*  K 4.1 3.7  CL 102 102  CO2 24 20  GLUCOSE 90 95  BUN 6 9  CREATININE 0.86 0.74  CALCIUM 8.9 8.6   LFT  Basename 01/05/12 0435  PROT 6.2  ALBUMIN 3.1*  AST 28  ALT 84*  ALKPHOS 105  BILITOT 1.4*  BILIDIR --  IBILI --    Studies/Results: Ct Abdomen Pelvis W Contrast  01/04/2012  *RADIOLOGY REPORT*  Clinical Data: Abdominal pain  CT ABDOMEN AND PELVIS WITH CONTRAST  Technique:  Multidetector CT imaging of the abdomen and pelvis was performed following the standard protocol during bolus administration of intravenous contrast.  Contrast: OMNIPAQUE IOHEXOL 300 MG/ML  SOLN  Comparison: 12/04/2011  Findings: Pigtail catheter has been placed into the gallbladder fossa fluid collection via S side hepatic approach.  The fluid collection has almost completely resolved.  Minimal stranding in the peroneal fat about the gallbladder fossa is present.  Common bile duct stent has been placed from the porta hepatis to the duodenum.  Post cholecystectomy.  Kidneys, adrenal glands, spleen are stable in appearance.  Pancreas is unremarkable.  No new abscess.  Trace free fluid is seen in the pelvis.  Uterus, bladder, and adnexa are unremarkable.  Visualized  appendix.  Lumbar degenerative disc disease.  Dependent atelectasis bilaterally.  IMPRESSION: Interval gallbladder fossa drain placement with near complete resolution of the gallbladder fossa fluid collection.  Inflammatory changes in the adjacent fat are present which may represent chemical peritonitis bile leak.  No new abscess.  Original Report Authenticated By: Donavan Burnet, M.D.   Dg Ercp With Sphincterotomy  01/05/2012  *RADIOLOGY REPORT*  Clinical Data: Common bile duct leak.  ERCP  Comparison:  CT 01/04/2012  Technique:  Multiple spot images obtained with the fluoroscopic device and submitted for interpretation post-procedure.  ERCP was performed by Dr. the gas.  Findings: 4 spot images are submitted from ERCP which demonstrate cannulation of the common bile duct.  Single image with contrast in the common bile duct is submitted which shows normal caliber biliary system.  I see no definite evidence of leak on the single contrast image.  Pigtail catheter is noted.  IMPRESSION: ERCP images as above.  These images were submitted for radiologic interpretation only. Please see the procedural report for the amount of contrast and the fluoroscopy time utilized.  Original Report Authenticated By: Cyndie Chime, M.D.    ASSESSMENT / PLAN:  1. Bile leak. Repeat ERCP yesterday was negative for leak. Stent seemed to be working but changed out anyway. Patient has just returned from IR where she had drain study done. I do not have report but looks like drain replaced. Await report.  2.  Chills, mildly elevated WBC and malodorous biliary drainage on admission. On IV antibiotics.     LOS: 2 days   Willette Cluster  01/06/2012, 11:56 AM

## 2012-01-06 NOTE — Progress Notes (Signed)
Patient seen, examined, and I agree with the above documentation, including the assessment and plan. ERCP yesterday did not show persistent bile leak. Drain check today by IR did show communication between gallbladder fossa and right hepatobiliary tree. I discussed this with Dr. Leone Payor who feels there are no other endoscopic options for this persistent leak. Treatment will likely require surgery, but will defer this to Dr. Corliss Skains. Call with questions.

## 2012-01-06 NOTE — Progress Notes (Signed)
1 Day Post-Op  Subjective: Patient feels much better today - not nauseated, less RUQ pain ERCP yesterday - no sign of leak on balloon occlusion cholangiogram; stent was changed, but the original stent seemed to be functioning Previous MRCP showed no sign of leak CT/ U/S showed resolution of fluid collection However, the drain continues to drain bilious drainage.  Could the drain be positioned up against the liver surface in the gallbladder fossa, causing a persistent tiny leak?  The patient had previously reported some intermittent blood in the drainage bag.    Objective: Vital signs in last 24 hours: Temp:  [97.5 F (36.4 C)-98 F (36.7 C)] 98 F (36.7 C) (06/07 0542) Pulse Rate:  [65-93] 82  (06/07 0542) Resp:  [16-24] 18  (06/07 0542) BP: (94-112)/(59-78) 107/65 mmHg (06/07 0542) SpO2:  [93 %-100 %] 97 % (06/07 0542) Last BM Date: 01/04/12  Intake/Output from previous day: 06/06 0701 - 06/07 0700 In: 1000 [I.V.:1000] Out: 2745 [Urine:2350; Drains:395] Intake/Output this shift: Total I/O In: -  Out: 300 [Urine:300]  General appearance: alert, cooperative and no distress GI: soft, less RUQ tenderness; drain site clear Bilious drainage in bag  Lab Results:   Basename 01/05/12 0435 01/04/12 1330  WBC 10.9* 12.6*  HGB 10.8* 13.1  HCT 33.3* 38.8  PLT 230 309   BMET  Basename 01/05/12 0435 01/04/12 1625  NA 135 133*  K 4.1 3.7  CL 102 102  CO2 24 20  GLUCOSE 90 95  BUN 6 9  CREATININE 0.86 0.74  CALCIUM 8.9 8.6   PT/INR No results found for this basename: LABPROT:2,INR:2 in the last 72 hours ABG No results found for this basename: PHART:2,PCO2:2,PO2:2,HCO3:2 in the last 72 hours  Studies/Results: Ct Abdomen Pelvis W Contrast  01/04/2012  *RADIOLOGY REPORT*  Clinical Data: Abdominal pain  CT ABDOMEN AND PELVIS WITH CONTRAST  Technique:  Multidetector CT imaging of the abdomen and pelvis was performed following the standard protocol during bolus administration  of intravenous contrast.  Contrast: OMNIPAQUE IOHEXOL 300 MG/ML  SOLN  Comparison: 12/04/2011  Findings: Pigtail catheter has been placed into the gallbladder fossa fluid collection via S side hepatic approach.  The fluid collection has almost completely resolved.  Minimal stranding in the peroneal fat about the gallbladder fossa is present.  Common bile duct stent has been placed from the porta hepatis to the duodenum.  Post cholecystectomy.  Kidneys, adrenal glands, spleen are stable in appearance.  Pancreas is unremarkable.  No new abscess.  Trace free fluid is seen in the pelvis.  Uterus, bladder, and adnexa are unremarkable.  Visualized appendix.  Lumbar degenerative disc disease.  Dependent atelectasis bilaterally.  IMPRESSION: Interval gallbladder fossa drain placement with near complete resolution of the gallbladder fossa fluid collection.  Inflammatory changes in the adjacent fat are present which may represent chemical peritonitis bile leak.  No new abscess.  Original Report Authenticated By: Donavan Burnet, M.D.   Dg Ercp With Sphincterotomy  01/05/2012  *RADIOLOGY REPORT*  Clinical Data: Common bile duct leak.  ERCP  Comparison:  CT 01/04/2012  Technique:  Multiple spot images obtained with the fluoroscopic device and submitted for interpretation post-procedure.  ERCP was performed by Dr. the gas.  Findings: 4 spot images are submitted from ERCP which demonstrate cannulation of the common bile duct.  Single image with contrast in the common bile duct is submitted which shows normal caliber biliary system.  I see no definite evidence of leak on the single contrast image.  Pigtail catheter is noted.  IMPRESSION: ERCP images as above.  These images were submitted for radiologic interpretation only. Please see the procedural report for the amount of contrast and the fluoroscopy time utilized.  Original Report Authenticated By: Cyndie Chime, M.D.    Anti-infectives: Anti-infectives     Start      Dose/Rate Route Frequency Ordered Stop   01/05/12 0000   piperacillin-tazobactam (ZOSYN) IVPB 3.375 g        3.375 g 12.5 mL/hr over 240 Minutes Intravenous Every 8 hours 01/04/12 1419     01/04/12 1430   piperacillin-tazobactam (ZOSYN) IVPB 3.375 g        3.375 g 100 mL/hr over 30 Minutes Intravenous NOW 01/04/12 1421 01/04/12 1623          Assessment/Plan: s/p Procedure(s) (LRB): ENDOSCOPIC RETROGRADE CHOLANGIOPANCREATOGRAPHY (ERCP) (N/A) Drain study today to determine if drainage tube is causing persistent irritation/ leak in GB fossa - drain may need to be repositioned Will start diet post- drain study  LOS: 2 days    Brion Hedges K. 01/06/2012

## 2012-01-07 LAB — CBC
Hemoglobin: 11.4 g/dL — ABNORMAL LOW (ref 12.0–15.0)
MCH: 29.5 pg (ref 26.0–34.0)
Platelets: 258 10*3/uL (ref 150–400)
RBC: 3.87 MIL/uL (ref 3.87–5.11)

## 2012-01-07 LAB — COMPREHENSIVE METABOLIC PANEL
ALT: 60 U/L — ABNORMAL HIGH (ref 0–35)
AST: 29 U/L (ref 0–37)
Alkaline Phosphatase: 123 U/L — ABNORMAL HIGH (ref 39–117)
CO2: 24 mEq/L (ref 19–32)
Calcium: 8.6 mg/dL (ref 8.4–10.5)
GFR calc non Af Amer: 86 mL/min — ABNORMAL LOW (ref >90)
Glucose, Bld: 114 mg/dL — ABNORMAL HIGH (ref 70–99)
Potassium: 3.7 mEq/L (ref 3.5–5.1)
Sodium: 134 mEq/L — ABNORMAL LOW (ref 135–145)
Total Protein: 6.7 g/dL (ref 6.0–8.3)

## 2012-01-07 LAB — TOTAL BILIRUBIN, BODY FLUID: Total bilirubin, fluid: 48.1 mg/dL

## 2012-01-07 NOTE — Progress Notes (Signed)
2 Days Post-Op  Subjective: GB fossa drain intact Injection 6/7 shows: +communication to Rt sided intrahepatic bile ducts Drain catheter exchanged Output bilious: 140 cc 6/7; 50 cc in bag now Pt feels some better now after a rough night of N/chills   Objective: Vital signs in last 24 hours: Temp:  [98.1 F (36.7 C)-99.7 F (37.6 C)] 98.1 F (36.7 C) (06/08 0640) Pulse Rate:  [87-99] 93  (06/08 0640) Resp:  [18] 18  (06/08 0640) BP: (97-112)/(65-71) 97/65 mmHg (06/08 0640) SpO2:  [93 %-98 %] 93 % (06/08 0640) Last BM Date: 01/04/12  Intake/Output from previous day: 06/07 0701 - 06/08 0700 In: 5090 [P.O.:240; I.V.:4800; IV Piggyback:50] Out: 2340 [Urine:2200; Drains:140] Intake/Output this shift:    PE:  Afeb; vss 140 cc bilious fluid output 6/7 50 cc in bag now Site clean and dry; sl tender   Lab Results:   Basename 01/06/12 2324 01/05/12 0435  WBC 9.7 10.9*  HGB 11.4* 10.8*  HCT 34.6* 33.3*  PLT 258 230   BMET  Basename 01/06/12 2324 01/05/12 0435  NA 134* 135  K 3.7 4.1  CL 100 102  CO2 24 24  GLUCOSE 114* 90  BUN 5* 6  CREATININE 0.83 0.86  CALCIUM 8.6 8.9   PT/INR No results found for this basename: LABPROT:2,INR:2 in the last 72 hours ABG No results found for this basename: PHART:2,PCO2:2,PO2:2,HCO3:2 in the last 72 hours  Studies/Results: Ir Sinus/fist Tube Chk-non Gi  01/06/2012  *RADIOLOGY REPORT*  Clinical Data: Status post cholecystectomy on 11/29/2011.  A percutaneous drain was placed on 12/12/2011 to treat a bile leak and biloma.  There is persistent drainage of bile via the percutaneous drain.  ERCP has not demonstrated active bile leak and an endoscopic common bile duct stent is in place.  1.  CONTRAST INJECTION OF PREEXISTING ABSCESS DRAINAGE CATHETER 2.  EXCHANGE OF PERCUTANEOUS DRAINAGE CATHETER  Comparison:  Multiple prior imaging studies including the most recent ERCP on 01/05/2012.  Contrast:  15 ml Omnipaque-300  Fluoroscopy Time: 1.4  minutes.  Procedure:  The procedure, risks, benefits, and alternatives were explained to the patient.  Questions regarding the procedure were encouraged and answered.  The patient understands and consents to the procedure.  The preexisting 12 French gallbladder fossa drainage catheter was prepped and draped.  Injection of contrast material was performed and multiple fluoroscopic spot images obtained in different projections.  The catheter was then cut and removed over a guidewire.  A 5-French catheter was advanced into the gallbladder fossa and injected with contrast.  A new 12-French Renee Pain drainage catheter was placed over a wire and formed.  Catheter was injected with contrast material and a fluoroscopic spot image obtained.  The catheter was flushed and connected to a gravity bag. It was secured at the skin with a Prolene retention suture and Stat- Lock device.  Complications: None  Findings: Initial drainage catheter injection fills a confined space at the level of the gallbladder fossa and then opacifies a segment of bile ducts in the right lobe of the liver superior to the drain.  The actual communication with the biliary tree was difficult to visualize due to the overlapping dense contrast material.  This is presumably located near the level of the original cystic duct insertion.  After opacification of bile ducts, no contrast was seen in the common bile duct at the level of the preexisting endoscopic stent. The superior end of the endoscopic stent lies fairly level with the drainage catheter  and does not extend up beyond what is presumed to be the level of communication with the right sided bile ducts.  Decision was made to replace the current drain with a Renee Pain catheter that has a smaller and tighter distal pigtail portion.  After this was placed, contrast injection initially did not demonstrate any communication with the biliary tree.  This will be left to gravity drainage.  IMPRESSION:  Contrast injection of the preexisting gallbladder fossa drain demonstrates communication with right-sided intrahepatic bile ducts.  The drain was exchanged for a 12-French Renee Pain catheter which has a smaller distal pigtail portion.  This will be left to gravity drainage.  Original Report Authenticated By: Reola Calkins, M.D.   Ir Catheter Tube Change  01/06/2012  *RADIOLOGY REPORT*  Clinical Data: Status post cholecystectomy on 11/29/2011.  A percutaneous drain was placed on 12/12/2011 to treat a bile leak and biloma.  There is persistent drainage of bile via the percutaneous drain.  ERCP has not demonstrated active bile leak and an endoscopic common bile duct stent is in place.  1.  CONTRAST INJECTION OF PREEXISTING ABSCESS DRAINAGE CATHETER 2.  EXCHANGE OF PERCUTANEOUS DRAINAGE CATHETER  Comparison:  Multiple prior imaging studies including the most recent ERCP on 01/05/2012.  Contrast:  15 ml Omnipaque-300  Fluoroscopy Time: 1.4 minutes.  Procedure:  The procedure, risks, benefits, and alternatives were explained to the patient.  Questions regarding the procedure were encouraged and answered.  The patient understands and consents to the procedure.  The preexisting 12 French gallbladder fossa drainage catheter was prepped and draped.  Injection of contrast material was performed and multiple fluoroscopic spot images obtained in different projections.  The catheter was then cut and removed over a guidewire.  A 5-French catheter was advanced into the gallbladder fossa and injected with contrast.  A new 12-French Renee Pain drainage catheter was placed over a wire and formed.  Catheter was injected with contrast material and a fluoroscopic spot image obtained.  The catheter was flushed and connected to a gravity bag. It was secured at the skin with a Prolene retention suture and Stat- Lock device.  Complications: None  Findings: Initial drainage catheter injection fills a confined space at the level  of the gallbladder fossa and then opacifies a segment of bile ducts in the right lobe of the liver superior to the drain.  The actual communication with the biliary tree was difficult to visualize due to the overlapping dense contrast material.  This is presumably located near the level of the original cystic duct insertion.  After opacification of bile ducts, no contrast was seen in the common bile duct at the level of the preexisting endoscopic stent. The superior end of the endoscopic stent lies fairly level with the drainage catheter and does not extend up beyond what is presumed to be the level of communication with the right sided bile ducts.  Decision was made to replace the current drain with a Renee Pain catheter that has a smaller and tighter distal pigtail portion.  After this was placed, contrast injection initially did not demonstrate any communication with the biliary tree.  This will be left to gravity drainage.  IMPRESSION: Contrast injection of the preexisting gallbladder fossa drain demonstrates communication with right-sided intrahepatic bile ducts.  The drain was exchanged for a 12-French Renee Pain catheter which has a smaller distal pigtail portion.  This will be left to gravity drainage.  Original Report Authenticated By: Reola Calkins, M.D.  Dg Ercp With Sphincterotomy  01/05/2012  *RADIOLOGY REPORT*  Clinical Data: Common bile duct leak.  ERCP  Comparison:  CT 01/04/2012  Technique:  Multiple spot images obtained with the fluoroscopic device and submitted for interpretation post-procedure.  ERCP was performed by Dr. the gas.  Findings: 4 spot images are submitted from ERCP which demonstrate cannulation of the common bile duct.  Single image with contrast in the common bile duct is submitted which shows normal caliber biliary system.  I see no definite evidence of leak on the single contrast image.  Pigtail catheter is noted.  IMPRESSION: ERCP images as above.  These images  were submitted for radiologic interpretation only. Please see the procedural report for the amount of contrast and the fluoroscopy time utilized.  Original Report Authenticated By: Cyndie Chime, M.D.    Anti-infectives: Anti-infectives     Start     Dose/Rate Route Frequency Ordered Stop   01/05/12 0000   piperacillin-tazobactam (ZOSYN) IVPB 3.375 g        3.375 g 12.5 mL/hr over 240 Minutes Intravenous Every 8 hours 01/04/12 1419     01/04/12 1430   piperacillin-tazobactam (ZOSYN) IVPB 3.375 g        3.375 g 100 mL/hr over 30 Minutes Intravenous NOW 01/04/12 1421 01/04/12 1623          Assessment/Plan: s/p Procedure(s) (LRB): ENDOSCOPIC RETROGRADE CHOLANGIOPANCREATOGRAPHY (ERCP) (N/A)  GB fossa drain intact; drain catheter exchanged 6/7 Injection shows communication to rt sided bile ducts Output good Will follow Plan per [facility]  Afifa Truax A 01/07/2012

## 2012-01-07 NOTE — Progress Notes (Signed)
<  principal problem not specified>  Subjective: Doesn't feel as well today, mild nausea, still some ruq pain, wants to eat. Notes she had temp about 100 last night.  Objective: Vital signs in last 24 hours: Temp:  [98.1 F (36.7 C)-99.7 F (37.6 C)] 98.1 F (36.7 C) (06/08 0640) Pulse Rate:  [87-99] 93  (06/08 0640) Resp:  [18] 18  (06/08 0640) BP: (97-112)/(65-71) 97/65 mmHg (06/08 0640) SpO2:  [93 %-98 %] 93 % (06/08 0640) Last BM Date: 01/04/12  Intake/Output from previous day: 06/07 0701 - 06/08 0700 In: 5090 [P.O.:240; I.V.:4800; IV Piggyback:50] Out: 2340 [Urine:2200; Drains:140] Intake/Output this shift:    General appearance: alert, cooperative and Appears mildly uncomfortable Resp: clear to auscultation bilaterally GI: Soft, non distended, mildly tender in RUQ, but rest of abdomen is not tender. Bile in drain looks clear Drainage may have slowed a bit.  Lab Results:  Results for orders placed during the hospital encounter of 01/04/12 (from the past 24 hour(s))  CBC     Status: Abnormal   Collection Time   01/06/12 11:24 PM      Component Value Range   WBC 9.7  4.0 - 10.5 (K/uL)   RBC 3.87  3.87 - 5.11 (MIL/uL)   Hemoglobin 11.4 (*) 12.0 - 15.0 (g/dL)   HCT 95.6 (*) 21.3 - 46.0 (%)   MCV 89.4  78.0 - 100.0 (fL)   MCH 29.5  26.0 - 34.0 (pg)   MCHC 32.9  30.0 - 36.0 (g/dL)   RDW 08.6  57.8 - 46.9 (%)   Platelets 258  150 - 400 (K/uL)  COMPREHENSIVE METABOLIC PANEL     Status: Abnormal   Collection Time   01/06/12 11:24 PM      Component Value Range   Sodium 134 (*) 135 - 145 (mEq/L)   Potassium 3.7  3.5 - 5.1 (mEq/L)   Chloride 100  96 - 112 (mEq/L)   CO2 24  19 - 32 (mEq/L)   Glucose, Bld 114 (*) 70 - 99 (mg/dL)   BUN 5 (*) 6 - 23 (mg/dL)   Creatinine, Ser 6.29  0.50 - 1.10 (mg/dL)   Calcium 8.6  8.4 - 52.8 (mg/dL)   Total Protein 6.7  6.0 - 8.3 (g/dL)   Albumin 3.1 (*) 3.5 - 5.2 (g/dL)   AST 29  0 - 37 (U/L)   ALT 60 (*) 0 - 35 (U/L)   Alkaline  Phosphatase 123 (*) 39 - 117 (U/L)   Total Bilirubin 0.8  0.3 - 1.2 (mg/dL)   GFR calc non Af Amer 86 (*) >90 (mL/min)   GFR calc Af Amer >90  >90 (mL/min)     Studies/Results Radiology     MEDS, Scheduled    . lidocaine      . lip balm  1 application Topical BID  . pantoprazole (PROTONIX) IV  40 mg Intravenous QHS  . piperacillin-tazobactam (ZOSYN)  IV  3.375 g Intravenous Q8H     Assessment: <principal problem not specified> Stable, but still with leak  Plan: Will offer full liquids, recheck labs in am   LOS: 3 days    Currie Paris, MD, [facility], [place] [phone]   01/07/2012 12:05 PM

## 2012-01-08 LAB — HEPATIC FUNCTION PANEL
ALT: 83 U/L — ABNORMAL HIGH (ref 0–35)
AST: 58 U/L — ABNORMAL HIGH (ref 0–37)
Bilirubin, Direct: 0.2 mg/dL (ref 0.0–0.3)
Indirect Bilirubin: 0.3 mg/dL (ref 0.3–0.9)
Total Bilirubin: 0.5 mg/dL (ref 0.3–1.2)

## 2012-01-08 MED ORDER — PANTOPRAZOLE SODIUM 40 MG PO TBEC
40.0000 mg | DELAYED_RELEASE_TABLET | Freq: Every day | ORAL | Status: DC
  Administered 2012-01-08: 40 mg via ORAL
  Filled 2012-01-08 (×2): qty 1

## 2012-01-08 NOTE — Progress Notes (Signed)
Bile leak, postoperative  Subjective: Feels better today. No nausea; less pain;wnats more to eat.  Objective: Vital signs in last 24 hours: Temp:  [97.7 F (36.5 C)-98.2 F (36.8 C)] 98.2 F (36.8 C) (06/09 0600) Pulse Rate:  [59-77] 59  (06/09 0600) Resp:  [18] 18  (06/09 0600) BP: (95-113)/(63-68) 95/63 mmHg (06/09 0600) SpO2:  [91 %-98 %] 91 % (06/09 0600) Last BM Date: 01/07/12 (She states she had a "normal", formed stool yesterday )  Intake/Output from previous day: 06/08 0701 - 06/09 0700 In: 1491.7 [I.V.:1426.7; IV Piggyback:60] Out: 2640 [Urine:2550; Drains:90] Intake/Output this shift:    General appearance: alert, cooperative and no distress GI: Soft, muld tender, bile in drain  Lab Results:  Results for orders placed during the hospital encounter of 01/04/12 (from the past 24 hour(s))  HEPATIC FUNCTION PANEL     Status: Abnormal   Collection Time   01/08/12  5:50 AM      Component Value Range   Total Protein 6.4  6.0 - 8.3 (g/dL)   Albumin 2.9 (*) 3.5 - 5.2 (g/dL)   AST 58 (*) 0 - 37 (U/L)   ALT 83 (*) 0 - 35 (U/L)   Alkaline Phosphatase 114  39 - 117 (U/L)   Total Bilirubin 0.5  0.3 - 1.2 (mg/dL)   Bilirubin, Direct 0.2  0.0 - 0.3 (mg/dL)   Indirect Bilirubin 0.3  0.3 - 0.9 (mg/dL)  LIPASE, BLOOD     Status: Normal   Collection Time   01/08/12  5:50 AM      Component Value Range   Lipase 48  11 - 59 (U/L)     Studies/Results Radiology     MEDS, Scheduled    . lip balm  1 application Topical BID  . pantoprazole  40 mg Oral QHS  . piperacillin-tazobactam (ZOSYN)  IV  3.375 g Intravenous Q8H  . DISCONTD: pantoprazole (PROTONIX) IV  40 mg Intravenous QHS     Assessment: Bile leak, postoperative Improving with lower drain output each day (90cc yesterday). LFT's stable, lipase pending  Plan: Will advance diet  LOS: 4 days    Currie Paris, MD, [facility], [place] [phone]   01/08/2012 11:22 AM

## 2012-01-08 NOTE — Progress Notes (Signed)
Patient is very concerned about T tube draining. Patient's tube was emptied at 0030, drained 30ml of bile green secretion. Since being emptied nothing noted in drainage bag. Note secretion in tube. Made on call [facility] aware. Orders given to irrigate x 1- 5 ml ns. Will carry out order and continue to monitor.

## 2012-01-08 NOTE — Progress Notes (Signed)
The patient is receiving Protonix by the intravenous route.  Based on criteria approved by the Pharmacy and Therapeutics Committee and the Medical Executive Committee, the medication is being converted to the equivalent oral dose form.  These criteria include: -No Active GI bleeding -Able to tolerate diet of full liquids (or better) or tube feeding OR able to tolerate other medications by the oral or enteral route  If you have any questions about this conversion, please contact the Pharmacy Department (ext [phone]).  Thank you.  Clydene Fake, [facility] 01/08/2012 6:21 AM

## 2012-01-09 ENCOUNTER — Other Ambulatory Visit (INDEPENDENT_AMBULATORY_CARE_PROVIDER_SITE_OTHER): Payer: Self-pay | Admitting: Surgery

## 2012-01-09 DIAGNOSIS — K839 Disease of biliary tract, unspecified: Secondary | ICD-10-CM

## 2012-01-09 MED ORDER — AMOXICILLIN-POT CLAVULANATE 875-125 MG PO TABS: 1.0000 | ORAL_TABLET | Freq: Two times a day (BID) | ORAL | Status: AC

## 2012-01-09 MED ORDER — OXYCODONE-ACETAMINOPHEN 5-325 MG PO TABS: 1.0000 | ORAL_TABLET | ORAL | Status: AC | PRN

## 2012-01-09 NOTE — Discharge Summary (Signed)
Physician Discharge Summary  Patient ID: Karen Roberts MRN: [id number] DOB/AGE: 1971-04-25 41 y.o.  Admit date: 01/04/2012 Discharge date: 01/09/2012  Admission Diagnoses: Persistent post-op bile leak  Discharge Diagnoses:  Principal Problem:  *Bile leak, postoperative   Discharged Condition: good  Hospital Course: Patient had drain placed for bile leak, presumed to be from duct of Luschka in the gallbladder fossa.  She came back in for increasing pain, persistent drainage.  The drainage continued to be a couple of hundred mls each day.  She had a CT scan that showed no fluid collection, but some stranding in the fat around the gallbladder fossa.  She underwent a repeat ERCP by Dr. Leone Payor that showed no sign of leak, but the biliary stent was replaced.  She then underwent drain study by Dr. Fredia Sorrow that showed that the drain communicated with part of the biliary system of the right lobe of the liver.  The drain was replaced with a smaller pigtail, and the patient has improved significantly.  Less drainage over the last few days, normal LFT's.  BM's, tolerating regular diet, less pain.  Consults: GI and Interventional radiology  Significant Diagnostic Studies: radiology: Drain study and replacement and endoscopy: ERCP: with exchange of stent  Treatments: see above  Discharge Exam: Blood pressure 111/72, pulse 58, temperature 97.6 F (36.4 C), temperature source Oral, resp. rate 18, height 5\' 6"  (1.676 m), weight 197 lb 12 oz (89.7 kg), last menstrual period 11/29/2011, SpO2 97.00%. General appearance: alert, cooperative and no distress GI: soft, non-tender; bowel sounds normal; no masses,  no organomegaly Drain - minimal drainage - no bile noted  Disposition: 01-Home or Self Care  Discharge Orders    Future Orders Please Complete By Expires   Diet general      Increase activity slowly      May walk up steps      May shower / Bathe      Driving Restrictions      Comments:   Do not drive while taking pain medications   Call MD for:  temperature >100.4      Call MD for:  persistant nausea and vomiting      Call MD for:  severe uncontrolled pain      Call MD for:  redness, tenderness, or signs of infection (pain, swelling, redness, odor or green/yellow discharge around incision site)      Discharge instructions      Comments:   Record drain output daily. You may shower, but you need to wrap the drain site so no shower water pools in this area.  Change the dressing when you get out of the shower. Regular diet     Medication List  As of 01/09/2012  7:59 AM   TAKE these medications         ALPRAZolam 0.25 MG tablet   Commonly known as: XANAX   Take 0.25 mg by mouth 3 (three) times daily as needed. For anxiety.      amoxicillin-clavulanate 875-125 MG per tablet   Commonly known as: AUGMENTIN   Take 1 tablet by mouth 2 (two) times daily.      ibuprofen 200 MG tablet   Commonly known as: ADVIL,MOTRIN   Take 200 mg by mouth every 8 (eight) hours as needed. For pain.      oxyCODONE-acetaminophen 5-325 MG per tablet   Commonly known as: PERCOCET   Take 1 tablet by mouth every 6 (six) hours as needed. For pain.  promethazine 12.5 MG tablet   Commonly known as: PHENERGAN   Take 1 tablet (12.5 mg total) by mouth every 6 (six) hours as needed. For nausea           Follow-up Information    Follow up with Wynona Luna., MD. Schedule an appointment as soon as possible for a visit in 3 days.   Contact information:   [employer] [address] [place] [address] [phone]        We will repeat labs and ultrasound on Thursday and will see you in the office on Friday.  Signed: Christopher Hink K. 01/09/2012, 7:59 AM

## 2012-01-09 NOTE — Discharge Instructions (Signed)
We will make arrangements for you to have an ultrasound and blood work on Thursday, then I will see you on Friday.  My office will make these arrangements.

## 2012-01-12 ENCOUNTER — Ambulatory Visit
Admission: RE | Admit: 2012-01-12 | Discharge: 2012-01-12 | Disposition: A | Discharge status: Home / Self Care | Payer: 59 | Source: Ambulatory Visit | Attending: Surgery | Admitting: Surgery

## 2012-01-12 ENCOUNTER — Other Ambulatory Visit (INDEPENDENT_AMBULATORY_CARE_PROVIDER_SITE_OTHER): Payer: Self-pay | Admitting: Surgery

## 2012-01-12 DIAGNOSIS — K839 Disease of biliary tract, unspecified: Secondary | ICD-10-CM

## 2012-01-12 LAB — CBC
HCT: 39.8 % (ref 36.0–46.0)
MCHC: 31.9 g/dL (ref 30.0–36.0)
MCV: 90.2 fL (ref 78.0–100.0)
Platelets: 389 10*3/uL (ref 150–400)
RDW: 15.4 % (ref 11.5–15.5)
WBC: 6 10*3/uL (ref 4.0–10.5)

## 2012-01-12 LAB — COMPREHENSIVE METABOLIC PANEL
ALT: 199 U/L — ABNORMAL HIGH (ref 0–35)
AST: 116 U/L — ABNORMAL HIGH (ref 0–37)
Alkaline Phosphatase: 138 U/L — ABNORMAL HIGH (ref 39–117)
BUN: 9 mg/dL (ref 6–23)
Creat: 0.84 mg/dL (ref 0.50–1.10)

## 2012-01-13 ENCOUNTER — Encounter (INDEPENDENT_AMBULATORY_CARE_PROVIDER_SITE_OTHER): Payer: Self-pay | Admitting: Surgery

## 2012-01-13 ENCOUNTER — Ambulatory Visit (INDEPENDENT_AMBULATORY_CARE_PROVIDER_SITE_OTHER): Payer: 59 | Admitting: Surgery

## 2012-01-13 VITALS — BP 126/88 | HR 97 | Temp 97.8°F | Resp 14 | Ht 66.0 in | Wt 196.4 lb

## 2012-01-13 DIAGNOSIS — K838 Other specified diseases of biliary tract: Secondary | ICD-10-CM

## 2012-01-13 DIAGNOSIS — K929 Disease of digestive system, unspecified: Secondary | ICD-10-CM

## 2012-01-13 NOTE — Progress Notes (Signed)
The patient is feeling much better.  No drainage coming out of the drain.  Minimal discomfort in the RUQ.  Ultrasound showed no residual fluid collection.  Labs showed normal CBC, normal bilirubin, but elevated alk phos and AST/ALT.  Clinical Data: History of laparoscopic cholecystectomy with bile  leak and percutaneous drainage  COMPLETE ABDOMINAL ULTRASOUND  Comparison: CT abdomen pelvis of 01/04/2012 and percutaneous drain  injection images of 01/06/2012  Findings:  Gallbladder: The gallbladder has been resected. The drainage  catheter is visualized. Immediately adjacent to this drainage  catheter there is a tiny collection of fluid of 8 x 7 x 10 mm.  Common bile duct: The common bile duct is normal measuring 5.0 mm  in diameter.  Liver: The liver has a normal echogenic pattern. No ductal  dilatation is seen.  IVC: Appears normal.  Pancreas: The head and tail of the pancreas are obscured by bowel  gas. The mid body appears normal.  Spleen: The spleen is normal in size measuring 4.6 cm sagittally.  Right Kidney: No hydronephrosis is seen. The right kidney  measures 11.0 cm sagittally.  Left Kidney: No hydronephrosis is noted. The left kidney measures  10.9 cm.  Abdominal aorta: The abdominal aorta is normal in caliber.  IMPRESSION:  1. A percutaneous drainage catheter remains with the tip in the  gallbladder bed with only a tiny amount of fluid adjacent to the  tip of this catheter as described above. Prior cholecystectomy.  2. Portions of the pancreas are obscured by bowel gas.  3. No intrahepatic ductal dilatation is seen.  Original Report Authenticated By: Juline Patch, M.D.      Lab Results  Component Value Date   WBC 6.0 01/12/2012   HGB 12.7 01/12/2012   HCT 39.8 01/12/2012   MCV 90.2 01/12/2012   PLT 389 01/12/2012   Lab Results  Component Value Date   ALT 199* 01/12/2012   AST 116* 01/12/2012   ALKPHOS 138* 01/12/2012   BILITOT 0.5 01/12/2012     Filed Vitals:   01/13/12 1417  BP: 126/88  Pulse: 97  Temp: 97.8 F (36.6 C)  Resp: 14    Her abdomen is soft and non-tender.  Incisions all well-healed.  The drain was removed without difficulty.  Dry dressing placed.  Follow-up labs next week and appointment 1 week.  Wilmon Arms. Corliss Skains, MD, [facility]  01/13/2012 2:40 PM

## 2012-01-13 NOTE — Patient Instructions (Signed)
Lab work next Wednesday, then come back to see me on Friday.

## 2012-01-17 ENCOUNTER — Telehealth: Payer: Self-pay | Admitting: Internal Medicine

## 2012-01-17 NOTE — Telephone Encounter (Signed)
Dr Leone Payor when does her stent need removed?  It was placed 01/05/12.  She was not scheduled for follow up when she left the hospital.

## 2012-01-18 LAB — CBC
HCT: 38.3 % (ref 36.0–46.0)
Hemoglobin: 12.8 g/dL (ref 12.0–15.0)
MCH: 29.2 pg (ref 26.0–34.0)
MCHC: 33.4 g/dL (ref 30.0–36.0)
MCV: 87.2 fL (ref 78.0–100.0)
RDW: 15.3 % (ref 11.5–15.5)

## 2012-01-18 LAB — COMPREHENSIVE METABOLIC PANEL
Albumin: 4.4 g/dL (ref 3.5–5.2)
Alkaline Phosphatase: 139 U/L — ABNORMAL HIGH (ref 39–117)
BUN: 13 mg/dL (ref 6–23)
Creat: 0.8 mg/dL (ref 0.50–1.10)
Glucose, Bld: 113 mg/dL — ABNORMAL HIGH (ref 70–99)
Potassium: 4.8 mEq/L (ref 3.5–5.3)
Total Bilirubin: 0.5 mg/dL (ref 0.3–1.2)

## 2012-01-20 ENCOUNTER — Encounter (INDEPENDENT_AMBULATORY_CARE_PROVIDER_SITE_OTHER): Payer: Self-pay | Admitting: Surgery

## 2012-01-20 ENCOUNTER — Ambulatory Visit (INDEPENDENT_AMBULATORY_CARE_PROVIDER_SITE_OTHER): Payer: 59 | Admitting: Surgery

## 2012-01-20 VITALS — BP 108/86 | HR 75 | Temp 97.6°F | Ht 66.0 in | Wt 198.6 lb

## 2012-01-20 DIAGNOSIS — K929 Disease of digestive system, unspecified: Secondary | ICD-10-CM

## 2012-01-20 DIAGNOSIS — K838 Other specified diseases of biliary tract: Secondary | ICD-10-CM

## 2012-01-20 MED ORDER — PROMETHAZINE HCL 12.5 MG PO TABS: 12.5000 mg | ORAL_TABLET | Freq: Four times a day (QID) | ORAL | Status: AC | PRN

## 2012-01-20 NOTE — Progress Notes (Signed)
The patient has had her drain out for one week.  She continues to have some nausea, but no fever or diarrhea.  She reports no significant right upper quadrant pain, but does complain of soreness in her epigastrium and near her umbilicus.  She has been more active in the last few days.    Filed Vitals:   01/20/12 0858  BP: 108/86  Pulse: 75  Temp: 97.6 F (36.4 C)   WDWN in NAD Incisions - well-healed; sore near her epigastric incision and her umbilical incision; no sign of infection or hernia No RUQ tenderness. Skin - no jaundice No scleral icterus.  Lab Results  Component Value Date   WBC 6.9 01/18/2012   HGB 12.8 01/18/2012   HCT 38.3 01/18/2012   MCV 87.2 01/18/2012   PLT 584* 01/18/2012   Lab Results  Component Value Date   ALT 140* 01/18/2012   AST 64* 01/18/2012   ALKPHOS 139* 01/18/2012   BILITOT 0.5 01/18/2012    I think the midline soreness is still related to her surgery.  There are no signs of bile peritonitis.  Labs are slowly improving.    Imp:  S/p lap chole with post-op bile leak from duct of Luschka s/p perc drain/ ERCP/ Stent  Plan:  NSAID's, heat for muscular soreness Follow-up with Dr. Leone Payor for evaluation and possible stent removal. Recheck in 3 weeks.  Karen Roberts. Corliss Skains, MD, [facility]  01/20/2012 10:58 AM

## 2012-01-24 ENCOUNTER — Telehealth: Payer: Self-pay | Admitting: Internal Medicine

## 2012-01-24 ENCOUNTER — Ambulatory Visit: Payer: 59 | Admitting: Internal Medicine

## 2012-01-24 NOTE — Telephone Encounter (Signed)
Patient is due for stent removal.  I have scheduled her for an office visit for 02/06/12 10:15

## 2012-02-06 ENCOUNTER — Encounter: Payer: Self-pay | Admitting: Internal Medicine

## 2012-02-06 ENCOUNTER — Ambulatory Visit (INDEPENDENT_AMBULATORY_CARE_PROVIDER_SITE_OTHER): Payer: 59 | Admitting: Internal Medicine

## 2012-02-06 VITALS — BP 90/60 | HR 88 | Ht 65.5 in | Wt 198.5 lb

## 2012-02-06 DIAGNOSIS — K838 Other specified diseases of biliary tract: Secondary | ICD-10-CM

## 2012-02-06 DIAGNOSIS — K929 Disease of digestive system, unspecified: Secondary | ICD-10-CM

## 2012-02-06 NOTE — Patient Instructions (Addendum)
You have been set up for a bile duct stent removal at [facility] on 02/07/12 at 3;30pm, arrive at admitting at 2:30pm.  Nothing to eat or drink after Midnight tonight.  Please bring a driver at least 43 years old to stay with you and to drive you home.

## 2012-02-06 NOTE — Progress Notes (Signed)
Subjective:    Patient ID: Karen Roberts, female    DOB: 12/16/1970, 40 y.o.   MRN: [id number]  HPI The patient returns for followup after having difficulties with a postoperative bile leak after laparoscopic cholecystectomy. She had a persistent leak and had 2 biliary stent placement so we could never documented lead to the ERCP. It turned out that the percutaneous drain she had in the gallbladder fossa was abutting the liver and promoting drainage from an accessory duct. Once that drain was changed for a smaller size drain, the leak stopped and she subsequently had that drain removed and feels well and back to normal. She would like to have her indwelling biliary stent removed.  Allergies  Allergen Reactions  . Ciprofloxacin Dermatitis    "dr's think it may have caused mastitis"  . Morphine And Related Nausea Only    Nausea and headache   Outpatient Prescriptions Prior to Visit  Medication Sig Dispense Refill  . ibuprofen (ADVIL,MOTRIN) 200 MG tablet Take 200 mg by mouth every 8 (eight) hours as needed. For pain.      . promethazine (PHENERGAN) 12.5 MG tablet Take 1 tablet (12.5 mg total) by mouth every 6 (six) hours as needed. For nausea  30 tablet  2  . ALPRAZolam (XANAX) 0.25 MG tablet Take 0.25 mg by mouth 3 (three) times daily as needed. For anxiety.      . amoxicillin-clavulanate (AUGMENTIN) 875-125 MG per tablet       . oxyCODONE-acetaminophen (PERCOCET) 5-325 MG per tablet Take 1 tablet by mouth every 6 (six) hours as needed. For pain.      . oxyCODONE-acetaminophen (PERCOCET) 5-325 MG per tablet       . promethazine (PHENERGAN) 12.5 MG tablet        Past Medical History  Diagnosis Date  . Allergy   . Anxiety   . PONV (postoperative nausea and vomiting)   . Ligament tear     left; "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  . Headache     "every once in awhile"  . Postoperative bile leak   . Hepatic steatosis 02/24/11   Past Surgical History  Procedure Date  . Cesarean section 2000  .  Appendectomy 2011  . Cholecystectomy 11/29/2011    Procedure: LAPAROSCOPIC CHOLECYSTECTOMY WITH INTRAOPERATIVE CHOLANGIOGRAM;  Surgeon: Matthew K. Tsuei, MD;  Location: [facility] OR;  Service: General;  Laterality: N/A;  . Rlq abscess drain placement 12/12/11  . Ercp 12/14/2011    Procedure: ENDOSCOPIC RETROGRADE CHOLANGIOPANCREATOGRAPHY (ERCP);  Surgeon: Marieme Mcmackin E Tametria Aho, MD;  Location: [facility] OR;  Service: Endoscopy;  Laterality: N/A;  . Ercp 01/05/2012    Procedure: ENDOSCOPIC RETROGRADE CHOLANGIOPANCREATOGRAPHY (ERCP);  Surgeon: Rasheka Denard E Hermon Zea, MD;  Location: [facility] ORS;  Service: Gastroenterology;  Laterality: N/A;   History   Social History  . Marital Status: Married    Spouse Name: N/A    Number of Children: 3  . Years of Education: N/A   Occupational History  . homemaker    Social History Main Topics  . Smoking status: Never Smoker   . Smokeless tobacco: Never Used  . Alcohol Use: Yes     12/12/11 "maybe once/ monthly [place] [employer] teas"  . Drug Use: No  . Sexually Active: Yes     Family History  Problem Relation Age of Onset  . Lung cancer Maternal Grandmother   . Brain cancer Maternal Grandmother   . Anesthesia problems Neg Hx   . Colon cancer Neg Hx   . Diabetes   Mother   . Heart failure Mother     Review of Systems Is feeling much better, less anxious, she's been able to take a vacation. No other problems are reported today.    Objective:   Physical Exam General:  NAD Eyes:   anicteric Lungs:  clear Heart:  S1S2 no rubs, murmurs or gallops Abdomen:  soft and mild tenderness in the epigastrium, BS+ well-healed but erythematous drain and laparoscopic scars     Data Reviewed:  Last surgery visit note     Assessment & Plan:   1. Bile leak, postoperative - resolved, with indwelling bile duct stent that needs to be removed    We'll schedule for endoscopic stent removal with fluoroscopic guidance.  I do not think she needs another cholangiogram given the overall  situation. I never did find a communication with the normal biliary tree and the accessory duct but she had stents placed because of the ongoing drainage and the chance that there might abandon on seen leak. Eventually this large accessory duct leak was found by injecting drainage catheter and with change in the size of the drainage catheter leak has resolved.  The risks and benefits as well as alternatives of endoscopic procedure(s) have been discussed and reviewed. All questions answered. The patient agrees to proceed.  

## 2012-02-07 ENCOUNTER — Ambulatory Visit (HOSPITAL_COMMUNITY)
Admission: RE | Admit: 2012-02-07 | Discharge: 2012-02-07 | Disposition: A | Discharge status: Home / Self Care | Payer: 59 | Source: Ambulatory Visit | Attending: Internal Medicine | Admitting: Internal Medicine

## 2012-02-07 ENCOUNTER — Encounter (HOSPITAL_COMMUNITY)
Admission: RE | Disposition: A | Discharge status: Home / Self Care | Payer: Self-pay | Source: Ambulatory Visit | Attending: Internal Medicine

## 2012-02-07 ENCOUNTER — Encounter (HOSPITAL_COMMUNITY): Payer: Self-pay | Admitting: *Deleted

## 2012-02-07 ENCOUNTER — Ambulatory Visit (HOSPITAL_COMMUNITY): Payer: 59

## 2012-02-07 DIAGNOSIS — K838 Other specified diseases of biliary tract: Secondary | ICD-10-CM

## 2012-02-07 DIAGNOSIS — K929 Disease of digestive system, unspecified: Secondary | ICD-10-CM

## 2012-02-07 DIAGNOSIS — Z4659 Encounter for fitting and adjustment of other gastrointestinal appliance and device: Secondary | ICD-10-CM | POA: Insufficient documentation

## 2012-02-07 HISTORY — PX: ESOPHAGOGASTRODUODENOSCOPY: SHX5428

## 2012-02-07 SURGERY — EGD (ESOPHAGOGASTRODUODENOSCOPY)
Anesthesia: Moderate Sedation

## 2012-02-07 MED ORDER — FENTANYL CITRATE 0.05 MG/ML IJ SOLN
INTRAMUSCULAR | Status: AC
  Filled 2012-02-07: qty 2

## 2012-02-07 MED ORDER — MIDAZOLAM HCL 10 MG/2ML IJ SOLN
INTRAMUSCULAR | Status: DC | PRN
  Administered 2012-02-07 (×4): 2 mg via INTRAVENOUS

## 2012-02-07 MED ORDER — SODIUM CHLORIDE 0.9 % IV SOLN
INTRAVENOUS | Status: DC
  Administered 2012-02-07: 500 mL via INTRAVENOUS

## 2012-02-07 MED ORDER — FENTANYL CITRATE 0.05 MG/ML IJ SOLN
INTRAMUSCULAR | Status: DC | PRN
  Administered 2012-02-07 (×3): 25 ug via INTRAVENOUS

## 2012-02-07 MED ORDER — BUTAMBEN-TETRACAINE-BENZOCAINE 2-2-14 % EX AERO
INHALATION_SPRAY | CUTANEOUS | Status: DC | PRN
  Administered 2012-02-07: 2 via TOPICAL

## 2012-02-07 MED ORDER — DIPHENHYDRAMINE HCL 50 MG/ML IJ SOLN
INTRAMUSCULAR | Status: DC | PRN
  Administered 2012-02-07 (×2): 12.5 mg via INTRAVENOUS

## 2012-02-07 MED ORDER — MIDAZOLAM HCL 10 MG/2ML IJ SOLN
INTRAMUSCULAR | Status: AC
  Filled 2012-02-07: qty 2

## 2012-02-07 MED ORDER — DIPHENHYDRAMINE HCL 50 MG/ML IJ SOLN
INTRAMUSCULAR | Status: AC
  Filled 2012-02-07: qty 1

## 2012-02-07 NOTE — H&P (View-Only) (Signed)
Subjective:    Patient ID: Karen Roberts, female    DOB: 1971/02/19, 41 y.o.   MRN: [id number]  HPI The patient returns for followup after having difficulties with a postoperative bile leak after laparoscopic cholecystectomy. She had a persistent leak and had 2 biliary stent placement so we could never documented lead to the ERCP. It turned out that the percutaneous drain she had in the gallbladder fossa was abutting the liver and promoting drainage from an accessory duct. Once that drain was changed for a smaller size drain, the leak stopped and she subsequently had that drain removed and feels well and back to normal. She would like to have her indwelling biliary stent removed.  Allergies  Allergen Reactions  . Ciprofloxacin Dermatitis    "dr's think it may have caused mastitis"  . Morphine And Related Nausea Only    Nausea and headache   Outpatient Prescriptions Prior to Visit  Medication Sig Dispense Refill  . ibuprofen (ADVIL,MOTRIN) 200 MG tablet Take 200 mg by mouth every 8 (eight) hours as needed. For pain.      . promethazine (PHENERGAN) 12.5 MG tablet Take 1 tablet (12.5 mg total) by mouth every 6 (six) hours as needed. For nausea  30 tablet  2  . ALPRAZolam (XANAX) 0.25 MG tablet Take 0.25 mg by mouth 3 (three) times daily as needed. For anxiety.      Marland Kitchen amoxicillin-clavulanate (AUGMENTIN) 875-125 MG per tablet       . oxyCODONE-acetaminophen (PERCOCET) 5-325 MG per tablet Take 1 tablet by mouth every 6 (six) hours as needed. For pain.      Marland Kitchen oxyCODONE-acetaminophen (PERCOCET) 5-325 MG per tablet       . promethazine (PHENERGAN) 12.5 MG tablet        Past Medical History  Diagnosis Date  . Allergy   . Anxiety   . PONV (postoperative nausea and vomiting)   . Ligament tear     left; "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  . Headache     "every once in awhile"  . Postoperative bile leak   . Hepatic steatosis 02/24/11   Past Surgical History  Procedure Date  . Cesarean section 2000  .  Appendectomy 2011  . Cholecystectomy 11/29/2011    Procedure: LAPAROSCOPIC CHOLECYSTECTOMY WITH INTRAOPERATIVE CHOLANGIOGRAM;  Surgeon: Wilmon Arms. Corliss Skains, MD;  Location: [facility] OR;  Service: General;  Laterality: N/A;  . Rlq abscess drain placement 12/12/11  . Ercp 12/14/2011    Procedure: ENDOSCOPIC RETROGRADE CHOLANGIOPANCREATOGRAPHY (ERCP);  Surgeon: Iva Boop, MD;  Location: [facility];  Service: Endoscopy;  Laterality: N/A;  . Ercp 01/05/2012    Procedure: ENDOSCOPIC RETROGRADE CHOLANGIOPANCREATOGRAPHY (ERCP);  Surgeon: Iva Boop, MD;  Location: [facility] ORS;  Service: Gastroenterology;  Laterality: N/A;   History   Social History  . Marital Status: Married    Spouse Name: N/A    Number of Children: 3  . Years of Education: N/A   Occupational History  . homemaker    Social History Main Topics  . Smoking status: Never Smoker   . Smokeless tobacco: Never Used  . Alcohol Use: Yes     12/12/11 "maybe once/ monthly [place] [employer] teas"  . Drug Use: No  . Sexually Active: Yes     Family History  Problem Relation Age of Onset  . Lung cancer Maternal Grandmother   . Brain cancer Maternal Grandmother   . Anesthesia problems Neg Hx   . Colon cancer Neg Hx   . Diabetes  Mother   . Heart failure Mother     Review of Systems Is feeling much better, less anxious, she's been able to take a vacation. No other problems are reported today.    Objective:   Physical Exam General:  NAD Eyes:   anicteric Lungs:  clear Heart:  S1S2 no rubs, murmurs or gallops Abdomen:  soft and mild tenderness in the epigastrium, BS+ well-healed but erythematous drain and laparoscopic scars     Data Reviewed:  Last surgery visit note     Assessment & Plan:   1. Bile leak, postoperative - resolved, with indwelling bile duct stent that needs to be removed    We'll schedule for endoscopic stent removal with fluoroscopic guidance.  I do not think she needs another cholangiogram given the overall  situation. I never did find a communication with the normal biliary tree and the accessory duct but she had stents placed because of the ongoing drainage and the chance that there might abandon on seen leak. Eventually this large accessory duct leak was found by injecting drainage catheter and with change in the size of the drainage catheter leak has resolved.  The risks and benefits as well as alternatives of endoscopic procedure(s) have been discussed and reviewed. All questions answered. The patient agrees to proceed.

## 2012-02-07 NOTE — Op Note (Signed)
[facility] [address] [place], [place]  [address]  ENDOSCOPY PROCEDURE REPORT  PATIENT:  Karen Roberts, Karen Roberts  MR#:  [id number] BIRTHDATE:  [birth year], 40 yrs. old  GENDER:  female  ENDOSCOPIST:  Iva Boop, MD, [facility]  PROCEDURE DATE:  02/07/2012 PROCEDURE:  EGD with foreign body removal ASA CLASS:  Class I INDICATIONS:  remove biliary stent  MEDICATIONS:   Fentanyl 75 mcg IV, Versed 8 mg IV, Benadryl 25 mg IV TOPICAL ANESTHETIC:  Cetacaine Spray  DESCRIPTION OF PROCEDURE:   After the risks benefits and alternatives of the procedure were thoroughly explained, informed consent was obtained.  The [id number] endoscope was introduced through the mouth and advanced to the second portion of the duodenum, with limitations of duodenoscope - unable to see esophagus well..  The instrument was slowly withdrawn as the mucosa was fully examined. <<PROCEDUREIMAGES>>  The esophagus was passed, GE junction normal. The stomach was entered and closely examined. The antrum, angularis, and lesser curvature were well visualized, including a retroflexed view of the cardia and fundus. The stomach wall was normally distensable. The scope passed easily through the pylorus into the duodenum. The duodenal bulb was normal in appearance, as was the postbulbar duodenum, except there was a biliary stent protruding from papilla - removed with snare, fluoroscopy used to guide and confirm removal.    Retroflexed views revealed no abnormalities.    The scope was then withdrawn from the patient and the procedure completed.  COMPLICATIONS:  None  ENDOSCOPIC IMPRESSION: 1) successful biliary stent removal 2) Normal exam otherwise, with limited esophageal views RECOMMENDATIONS: Follow-up with me as needed.  REPEAT EXAM:  No  Iva Boop, MD, [facility]  CC:  Manus Rudd, MD and The Patient  n. eSIGNED:   Iva Boop at 02/07/2012 04:24 PM  [name], [phone]

## 2012-02-07 NOTE — Interval H&P Note (Signed)
History and Physical Interval Note:  02/07/2012 3:30 PM  Karen Roberts  has presented today for surgery, with the diagnosis of Bile duct stent removal   The various methods of treatment have been discussed with the patient and family. After consideration of risks, benefits and other options for treatment, the patient has consented to  Procedure(s) (LRB): ESOPHAGOGASTRODUODENOSCOPY (EGD) (N/A) with stent removalas a surgical intervention .  The patient's history has been reviewed, patient examined, no change in status, stable for surgery.  I have reviewed the patients' chart and labs.  Questions were answered to the patient's satisfaction.     Stan Head, MD, [facility]

## 2012-02-08 ENCOUNTER — Encounter (HOSPITAL_COMMUNITY): Payer: Self-pay | Admitting: Internal Medicine

## 2012-02-14 ENCOUNTER — Ambulatory Visit (INDEPENDENT_AMBULATORY_CARE_PROVIDER_SITE_OTHER): Payer: 59 | Admitting: Surgery

## 2012-02-14 ENCOUNTER — Encounter (INDEPENDENT_AMBULATORY_CARE_PROVIDER_SITE_OTHER): Payer: Self-pay | Admitting: Surgery

## 2012-02-14 VITALS — BP 120/78 | HR 70 | Temp 97.4°F | Resp 16 | Ht 65.0 in | Wt 199.4 lb

## 2012-02-14 DIAGNOSIS — K838 Other specified diseases of biliary tract: Secondary | ICD-10-CM

## 2012-02-14 DIAGNOSIS — K8010 Calculus of gallbladder with chronic cholecystitis without obstruction: Secondary | ICD-10-CM

## 2012-02-14 DIAGNOSIS — K929 Disease of digestive system, unspecified: Secondary | ICD-10-CM

## 2012-02-14 NOTE — Progress Notes (Signed)
Patient ID: Karen Roberts, female   DOB: 04/17/1971, 41 y.o.   MRN: [id number] The patient comes in for a final postoperative visit. She is doing well. Appetite and bowel movements are normal. She has been having problems with headaches the last couple days but feels that this is unrelated to her gallbladder surgery. No abdominal tenderness. Active bowel sounds. Incisions are all well healed. She may resume full activity. Followup p.r.n.  Wilmon Arms. Corliss Skains, MD, [facility]  02/14/2012 1:51 PM

## 2012-03-15 NOTE — Telephone Encounter (Signed)
See note from 02-06-12

## 2012-05-03 ENCOUNTER — Ambulatory Visit: Payer: 59 | Admitting: Family Medicine

## 2012-06-15 LAB — HM MAMMOGRAPHY

## 2012-06-20 ENCOUNTER — Encounter: Payer: Self-pay | Admitting: Internal Medicine

## 2012-06-20 LAB — HM MAMMOGRAPHY

## 2012-07-08 ENCOUNTER — Encounter (HOSPITAL_COMMUNITY): Payer: Self-pay

## 2012-07-08 ENCOUNTER — Emergency Department (HOSPITAL_COMMUNITY)
Admission: EM | Admit: 2012-07-08 | Discharge: 2012-07-08 | Disposition: A | Discharge status: Home / Self Care | Payer: 59 | Attending: Emergency Medicine | Admitting: Emergency Medicine

## 2012-07-08 DIAGNOSIS — Z87828 Personal history of other (healed) physical injury and trauma: Secondary | ICD-10-CM | POA: Insufficient documentation

## 2012-07-08 DIAGNOSIS — Z862 Personal history of diseases of the blood and blood-forming organs and certain disorders involving the immune mechanism: Secondary | ICD-10-CM | POA: Insufficient documentation

## 2012-07-08 DIAGNOSIS — Z79899 Other long term (current) drug therapy: Secondary | ICD-10-CM | POA: Insufficient documentation

## 2012-07-08 DIAGNOSIS — R110 Nausea: Secondary | ICD-10-CM | POA: Insufficient documentation

## 2012-07-08 DIAGNOSIS — Z9889 Other specified postprocedural states: Secondary | ICD-10-CM | POA: Insufficient documentation

## 2012-07-08 DIAGNOSIS — Z8639 Personal history of other endocrine, nutritional and metabolic disease: Secondary | ICD-10-CM | POA: Insufficient documentation

## 2012-07-08 DIAGNOSIS — Z8659 Personal history of other mental and behavioral disorders: Secondary | ICD-10-CM | POA: Insufficient documentation

## 2012-07-08 DIAGNOSIS — N390 Urinary tract infection, site not specified: Secondary | ICD-10-CM | POA: Insufficient documentation

## 2012-07-08 LAB — URINALYSIS, ROUTINE W REFLEX MICROSCOPIC
Glucose, UA: NEGATIVE mg/dL
Nitrite: NEGATIVE
Protein, ur: 100 mg/dL — AB
Urobilinogen, UA: 0.2 mg/dL (ref 0.0–1.0)

## 2012-07-08 LAB — URINE MICROSCOPIC-ADD ON

## 2012-07-08 MED ORDER — HYDROMORPHONE HCL PF 1 MG/ML IJ SOLN
1.0000 mg | Freq: Once | INTRAMUSCULAR | Status: AC
  Administered 2012-07-08: 1 mg via INTRAMUSCULAR
  Filled 2012-07-08: qty 1

## 2012-07-08 MED ORDER — ONDANSETRON 8 MG PO TBDP
8.0000 mg | ORAL_TABLET | Freq: Once | ORAL | Status: AC
  Administered 2012-07-08: 8 mg via ORAL
  Filled 2012-07-08: qty 1

## 2012-07-08 MED ORDER — PHENAZOPYRIDINE HCL 200 MG PO TABS: 200.0000 mg | ORAL_TABLET | Freq: Three times a day (TID) | ORAL | Status: DC

## 2012-07-08 MED ORDER — CEFTRIAXONE SODIUM 1 G IJ SOLR
1.0000 g | Freq: Once | INTRAMUSCULAR | Status: AC
  Administered 2012-07-08: 1 g via INTRAMUSCULAR
  Filled 2012-07-08: qty 10

## 2012-07-08 MED ORDER — PHENAZOPYRIDINE HCL 200 MG PO TABS
200.0000 mg | ORAL_TABLET | Freq: Three times a day (TID) | ORAL | Status: DC
  Administered 2012-07-08: 200 mg via ORAL
  Filled 2012-07-08: qty 1

## 2012-07-08 MED ORDER — SULFAMETHOXAZOLE-TRIMETHOPRIM 800-160 MG PO TABS: 1.0000 | ORAL_TABLET | Freq: Two times a day (BID) | ORAL | Status: DC

## 2012-07-08 NOTE — ED Provider Notes (Signed)
History     CSN: [id number]  Arrival date & time 07/08/12  0316   First MD Initiated Contact with Patient 07/08/12 0414      Chief Complaint  Patient presents with  . Dysuria    (Consider location/radiation/quality/duration/timing/severity/associated sxs/prior treatment) HPI Comments: Patient presents with a three-day history of worsening pain on urination. Chest a pressure feeling in her suprapubic area. She's been urinating more frequently. She denies any abdominal or back pain. She's had some nausea but no vomiting. Denies any fevers or chills. Denies any history of UTIs in the past. Denies any vaginal bleeding or discharge. She's been taking over-the-counter medicines without relief.  Patient is a 41 y.o. female presenting with dysuria.  Dysuria  Associated symptoms include frequency. Pertinent negatives include no chills, no nausea, no vomiting, no hematuria and no flank pain.    Past Medical History  Diagnosis Date  . Allergy   . Anxiety   . PONV (postoperative nausea and vomiting)   . Ligament tear     left; "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  . Headache     "every once in awhile"  . Postoperative bile leak   . Hepatic steatosis 02/24/11    Past Surgical History  Procedure Date  . Cesarean section 2000  . Appendectomy 2011  . Cholecystectomy 11/29/2011    Procedure: LAPAROSCOPIC CHOLECYSTECTOMY WITH INTRAOPERATIVE CHOLANGIOGRAM;  Surgeon: Wilmon Arms. Corliss Skains, MD;  Location: [facility] OR;  Service: General;  Laterality: N/A;  . Rlq abscess drain placement 12/12/11  . Ercp 12/14/2011    Procedure: ENDOSCOPIC RETROGRADE CHOLANGIOPANCREATOGRAPHY (ERCP);  Surgeon: Iva Boop, MD;  Location: [facility] OR;  Service: Endoscopy;  Laterality: N/A;  . Ercp 01/05/2012    Procedure: ENDOSCOPIC RETROGRADE CHOLANGIOPANCREATOGRAPHY (ERCP);  Surgeon: Iva Boop, MD;  Location: [facility] ORS;  Service: Gastroenterology;  Laterality: N/A;  . Esophagogastroduodenoscopy 02/07/2012    Procedure: ESOPHAGOGASTRODUODENOSCOPY  (EGD);  Surgeon: Iva Boop, MD;  Location: [facility];  Service: Endoscopy;  Laterality: N/A;  endo with stent removal/ no dye needed/may need ercp scope/need xray    Family History  Problem Relation Age of Onset  . Lung cancer Maternal Grandmother   . Brain cancer Maternal Grandmother   . Anesthesia problems Neg Hx   . Colon cancer Neg Hx   . Diabetes Mother   . Heart failure Mother     History  Substance Use Topics  . Smoking status: Never Smoker   . Smokeless tobacco: Never Used  . Alcohol Use: Yes     Comment: 12/12/11 "maybe once/ monthly [place] iced teas"    OB History    Grav Para Term Preterm Abortions TAB SAB Ect Mult Living                  Review of Systems  Constitutional: Negative for fever, chills, diaphoresis and fatigue.  HENT: Negative for congestion, rhinorrhea and sneezing.   Eyes: Negative.   Respiratory: Negative for cough, chest tightness and shortness of breath.   Cardiovascular: Negative for chest pain and leg swelling.  Gastrointestinal: Negative for nausea, vomiting, abdominal pain, diarrhea and blood in stool.  Genitourinary: Positive for dysuria and frequency. Negative for hematuria, flank pain and difficulty urinating.  Musculoskeletal: Negative for back pain and arthralgias.  Skin: Negative for rash.  Neurological: Negative for dizziness, speech difficulty, weakness, numbness and headaches.    Allergies  Ciprofloxacin and Morphine and related  Home Medications   Current Outpatient Rx  Name  Route  Sig  Dispense  Refill  . IBUPROFEN 200 MG PO TABS   Oral   Take 200 mg by mouth every 8 (eight) hours as needed. For pain.         Marland Kitchen PENICILLIN V POTASSIUM 500 MG PO TABS   Oral   Take 500 mg by mouth 3 (three) times daily. Start date:06/20/12         . PHENAZOPYRIDINE HCL 200 MG PO TABS   Oral   Take 1 tablet (200 mg total) by mouth 3 (three) times daily.   6 tablet   0   . PROMETHAZINE HCL 12.5 MG PO TABS   Oral    Take 1 tablet (12.5 mg total) by mouth every 6 (six) hours as needed. For nausea   30 tablet   2   . SULFAMETHOXAZOLE-TRIMETHOPRIM 800-160 MG PO TABS   Oral   Take 1 tablet by mouth every 12 (twelve) hours.   14 tablet   0     BP 120/76  Pulse 103  Temp 97.7 F (36.5 C) (Oral)  Resp 20  SpO2 99%  LMP 06/25/2012  Physical Exam  Constitutional: She is oriented to person, place, and time. She appears well-developed and well-nourished.  HENT:  Head: Normocephalic and atraumatic.  Eyes: Pupils are equal, round, and reactive to light.  Neck: Normal range of motion. Neck supple.  Cardiovascular: Normal rate, regular rhythm and normal heart sounds.   Pulmonary/Chest: Effort normal and breath sounds normal. No respiratory distress. She has no wheezes. She has no rales. She exhibits no tenderness.  Abdominal: Soft. Bowel sounds are normal. There is no tenderness. There is no rebound and no guarding.       No CVA tenderness  Musculoskeletal: Normal range of motion. She exhibits no edema.  Lymphadenopathy:    She has no cervical adenopathy.  Neurological: She is alert and oriented to person, place, and time.  Skin: Skin is warm and dry. No rash noted.  Psychiatric: She has a normal mood and affect.    ED Course  Procedures (including critical care time)  Results for orders placed during the hospital encounter of 07/08/12  URINALYSIS, ROUTINE W REFLEX MICROSCOPIC      Component Value Range   Color, Urine YELLOW  YELLOW   APPearance TURBID (*) CLEAR   Specific Gravity, Urine 1.021  1.005 - 1.030   pH 5.5  5.0 - 8.0   Glucose, UA NEGATIVE  NEGATIVE mg/dL   Hgb urine dipstick MODERATE (*) NEGATIVE   Bilirubin Urine NEGATIVE  NEGATIVE   Ketones, ur NEGATIVE  NEGATIVE mg/dL   Protein, ur 409 (*) NEGATIVE mg/dL   Urobilinogen, UA 0.2  0.0 - 1.0 mg/dL   Nitrite NEGATIVE  NEGATIVE   Leukocytes, UA LARGE (*) NEGATIVE  URINE MICROSCOPIC-ADD ON      Component Value Range   Squamous  Epithelial / LPF MANY (*) RARE   WBC, UA 21-50  <3 WBC/hpf   RBC / HPF 11-20  <3 RBC/hpf   Bacteria, UA MANY (*) RARE   No results found.   No results found.   1. UTI (lower urinary tract infection)       MDM  Patient with UTI. There is no evidence of pyelonephritis. She's not tender in her abdomen. She does have what she describes as excruciating pain on urination. She was given a shot at the allotted here as well as a dose of Pyridium. Was also given a shot of Rocephin. Will be started on  Bactrim and Pyridium to use at home. Was advised to followup with her primary care physician if her symptoms are not better next 2 days or return here as needed for any worsening symptoms. Her urine was sent for culture.        Rolan Bucco, MD 07/08/12 [phone]

## 2012-07-08 NOTE — ED Notes (Signed)
Per pt, pt having pain with urination.  Pt states having difficulty since Thursday.  Has seen blood in urine.  Frequent urination.  Fever unknown.

## 2012-07-10 LAB — URINE CULTURE: Colony Count: >=100000

## 2012-07-11 NOTE — ED Notes (Signed)
+   Urine Patient treated with Septra-sensitive to same-chart appended per protocol MD. 

## 2012-07-16 ENCOUNTER — Encounter (HOSPITAL_COMMUNITY): Payer: Self-pay | Admitting: *Deleted

## 2012-07-16 ENCOUNTER — Emergency Department (HOSPITAL_COMMUNITY)
Admission: EM | Admit: 2012-07-16 | Discharge: 2012-07-16 | Disposition: A | Discharge status: Home / Self Care | Payer: 59 | Attending: Emergency Medicine | Admitting: Emergency Medicine

## 2012-07-16 DIAGNOSIS — L270 Generalized skin eruption due to drugs and medicaments taken internally: Secondary | ICD-10-CM

## 2012-07-16 DIAGNOSIS — Z8659 Personal history of other mental and behavioral disorders: Secondary | ICD-10-CM | POA: Insufficient documentation

## 2012-07-16 DIAGNOSIS — Z8669 Personal history of other diseases of the nervous system and sense organs: Secondary | ICD-10-CM | POA: Insufficient documentation

## 2012-07-16 DIAGNOSIS — Z79899 Other long term (current) drug therapy: Secondary | ICD-10-CM | POA: Insufficient documentation

## 2012-07-16 DIAGNOSIS — Z87828 Personal history of other (healed) physical injury and trauma: Secondary | ICD-10-CM | POA: Insufficient documentation

## 2012-07-16 DIAGNOSIS — Z8719 Personal history of other diseases of the digestive system: Secondary | ICD-10-CM | POA: Insufficient documentation

## 2012-07-16 DIAGNOSIS — N390 Urinary tract infection, site not specified: Secondary | ICD-10-CM | POA: Insufficient documentation

## 2012-07-16 DIAGNOSIS — R300 Dysuria: Secondary | ICD-10-CM | POA: Insufficient documentation

## 2012-07-16 MED ORDER — DIPHENHYDRAMINE HCL 50 MG/ML IJ SOLN
25.0000 mg | Freq: Once | INTRAMUSCULAR | Status: AC
  Administered 2012-07-16: 25 mg via INTRAVENOUS
  Filled 2012-07-16: qty 1

## 2012-07-16 MED ORDER — EPINEPHRINE 0.3 MG/0.3ML IJ DEVI: 0.3000 mg | INTRAMUSCULAR | Status: DC | PRN

## 2012-07-16 MED ORDER — SODIUM CHLORIDE 0.9 % IV BOLUS (SEPSIS)
1000.0000 mL | Freq: Once | INTRAVENOUS | Status: AC
  Administered 2012-07-16: 1000 mL via INTRAVENOUS

## 2012-07-16 MED ORDER — FAMOTIDINE 20 MG PO TABS: 20.0000 mg | ORAL_TABLET | Freq: Two times a day (BID) | ORAL | Status: DC

## 2012-07-16 MED ORDER — DIPHENHYDRAMINE HCL 25 MG PO CAPS: 25.0000 mg | ORAL_CAPSULE | Freq: Four times a day (QID) | ORAL | Status: DC | PRN

## 2012-07-16 MED ORDER — PREDNISONE 20 MG PO TABS
60.0000 mg | ORAL_TABLET | Freq: Once | ORAL | Status: AC
  Administered 2012-07-16: 60 mg via ORAL
  Filled 2012-07-16: qty 3

## 2012-07-16 MED ORDER — FAMOTIDINE 20 MG PO TABS
40.0000 mg | ORAL_TABLET | Freq: Once | ORAL | Status: AC
  Administered 2012-07-16: 40 mg via ORAL
  Filled 2012-07-16: qty 2

## 2012-07-16 NOTE — ED Provider Notes (Signed)
History     CSN: [id number]  Arrival date & time 07/16/12  1707   First MD Initiated Contact with Patient 07/16/12 1743      Chief Complaint  Patient presents with  . Rash    (Consider location/radiation/quality/duration/timing/severity/associated sxs/prior treatment) HPI  41 year old female presents to ED for evaluation of a rash. Patient was treated for a urinary tract infection last week with Bactrim and penicillin. States her dysuria has improved with treatment. She also reports having an upper respiratory infection with sneezing, cough, Raynaud's. She has been taking over-the-counter NyQuil, ibuprofen, Benadryl, and phenazopyridine. Since yesterday she has developed a burning sensation throughout the skin, with rash, and itch throughout.  She also feels that her chest is tight, which worries her.  Sts her rash sxs is wax and wane.  Otherwise pt denies fever, headache, cp, back pain.    Past Medical History  Diagnosis Date  . Allergy   . Anxiety   . PONV (postoperative nausea and vomiting)   . Ligament tear     left; "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  . Headache     "every once in awhile"  . Postoperative bile leak   . Hepatic steatosis 02/24/11    Past Surgical History  Procedure Date  . Cesarean section 2000  . Appendectomy 2011  . Cholecystectomy 11/29/2011    Procedure: LAPAROSCOPIC CHOLECYSTECTOMY WITH INTRAOPERATIVE CHOLANGIOGRAM;  Surgeon: Wilmon Arms. Corliss Skains, MD;  Location: [facility] OR;  Service: General;  Laterality: N/A;  . Rlq abscess drain placement 12/12/11  . Ercp 12/14/2011    Procedure: ENDOSCOPIC RETROGRADE CHOLANGIOPANCREATOGRAPHY (ERCP);  Surgeon: Iva Boop, MD;  Location: [facility] OR;  Service: Endoscopy;  Laterality: N/A;  . Ercp 01/05/2012    Procedure: ENDOSCOPIC RETROGRADE CHOLANGIOPANCREATOGRAPHY (ERCP);  Surgeon: Iva Boop, MD;  Location: [facility] ORS;  Service: Gastroenterology;  Laterality: N/A;  . Esophagogastroduodenoscopy 02/07/2012    Procedure:  ESOPHAGOGASTRODUODENOSCOPY (EGD);  Surgeon: Iva Boop, MD;  Location: [facility];  Service: Endoscopy;  Laterality: N/A;  endo with stent removal/ no dye needed/may need ercp scope/need xray    Family History  Problem Relation Age of Onset  . Lung cancer Maternal Grandmother   . Brain cancer Maternal Grandmother   . Anesthesia problems Neg Hx   . Colon cancer Neg Hx   . Diabetes Mother   . Heart failure Mother     History  Substance Use Topics  . Smoking status: Never Smoker   . Smokeless tobacco: Never Used  . Alcohol Use: Yes     Comment: 12/12/11 "maybe once/ monthly [place] iced teas"    OB History    Grav Para Term Preterm Abortions TAB SAB Ect Mult Living                  Review of Systems  All other systems reviewed and are negative.    Allergies  Ciprofloxacin and Morphine and related  Home Medications   Current Outpatient Rx  Name  Route  Sig  Dispense  Refill  . IBUPROFEN 200 MG PO TABS   Oral   Take 200 mg by mouth every 8 (eight) hours as needed. For pain.         Marland Kitchen PENICILLIN V POTASSIUM 500 MG PO TABS   Oral   Take 500 mg by mouth 3 (three) times daily. Start date:06/20/12         . PHENAZOPYRIDINE HCL 200 MG PO TABS   Oral   Take 1 tablet (200  mg total) by mouth 3 (three) times daily.   6 tablet   0   . PROMETHAZINE HCL 12.5 MG PO TABS   Oral   Take 1 tablet (12.5 mg total) by mouth every 6 (six) hours as needed. For nausea   30 tablet   2   . SULFAMETHOXAZOLE-TRIMETHOPRIM 800-160 MG PO TABS   Oral   Take 1 tablet by mouth every 12 (twelve) hours.   14 tablet   0     BP 114/74  Pulse 118  Temp 99.2 F (37.3 C) (Oral)  Resp 18  Ht 5\' 6"  (1.676 m)  Wt 198 lb (89.812 kg)  BMI 31.96 kg/m2  SpO2 95%  LMP 06/25/2012  Physical Exam  Nursing note and vitals reviewed. Constitutional: She is oriented to person, place, and time. She appears well-developed and well-nourished. No distress.  HENT:  Mouth/Throat:  Oropharynx is clear and moist. No oropharyngeal exudate.       No airway compromise  Eyes: Conjunctivae normal are normal.  Neck: Normal range of motion. Neck supple.  Cardiovascular:       Tachycardia without murmurs, rubs, gallops  Pulmonary/Chest: Effort normal and breath sounds normal. No respiratory distress. She has no wheezes. She has no rales. She exhibits no tenderness.  Abdominal: Soft. There is no tenderness.  Musculoskeletal: She exhibits no edema.  Neurological: She is alert and oriented to person, place, and time.  Skin: Skin is warm. Rash (Skin is mildly erythematous diffusely throughout body, blanchable. No petechia, posterior, or vesicular lesion noted) noted.  Psychiatric: She has a normal mood and affect.    ED Course  Procedures (including critical care time)  Labs Reviewed - No data to display No results found.   No diagnosis found.  1. Allergic drug rash  MDM  41 year old female presents with itchy rash throughout her body likely secondary to drug reaction. She has been taking multiple different medication including NSAIDs, Septra, penicillin to name a few. I do not know which medication she is allergic to. However there is no evidence of airway compromise. Patient is tachycardic but without hypotension. Plan to give IV fluid, prednisone, Benadryl, and Pepcid and will monitor.  7:46 PM No worsening of her symptoms in the ED. Her heart rate improves with IV fluids. Patient is able to tolerates by mouth. She has no respiratory complaints at this time. Patient will be discharged. Plan to give patient EpiPen to use in case of any emergency if she developed respiratory distress. Patient voiced understanding and agrees with plan. She will also follow up with her primary care Dr., Dr. Susann Givens for recheck. Care discussed with my attending.    BP 115/55  Pulse 106  Temp 99.2 F (37.3 C) (Oral)  Resp 18  Ht 5\' 6"  (1.676 m)  Wt 198 lb (89.812 kg)  BMI 31.96 kg/m2   SpO2 99%  LMP 06/25/2012  I have reviewed nursing notes and vital signs.  I reviewed available ER/hospitalization records thought the EMR      Fayrene Helper, [place] 07/16/12 1949

## 2012-07-16 NOTE — ED Notes (Signed)
Pt from home with c/o generalized rash. Sts no shob or swelling with it. Sts her stomach is hurting as well. Patient believes she "over did it" with OTC medications while trying to treat her cold at home. Has been taking Nyquil, night time sleep aid OTC, phenazopyridine, ibuprofen, bactrim, benadryl and pennicillin. Sts she has recently been treated for UTI and infected tooth with bactrim and penicillin. Patient ambulatory, alert and oriented in triage.

## 2012-07-17 NOTE — ED Provider Notes (Signed)
Medical screening examination/treatment/procedure(s) were performed by non-physician practitioner and as supervising physician I was immediately available for consultation/collaboration.   Gwyneth Sprout, MD 07/17/12 1555

## 2012-09-20 IMAGING — CT CT ABD-PELV W/ CM
1 of 3 series · 14 of 32 positions shown, 19 images · IV contrast (APPLIED)
Comparison: 12/04/2011

CLINICAL DATA: Abdominal pain

CT ABDOMEN AND PELVIS WITH CONTRAST
TECHNIQUE: Multidetector CT imaging of the abdomen and pelvis was
performed following the standard protocol during bolus
administration of intravenous contrast.
Contrast: 100mL OMNIPAQUE IOHEXOL 300 MG/ML  SOLN

[Series 2: abd/pel with · axial · 0.74mm/px · z∈[+1198,+1618]mm · 14 of 94 slices shown, 19 images]
[im 5/94  soft-tissue]
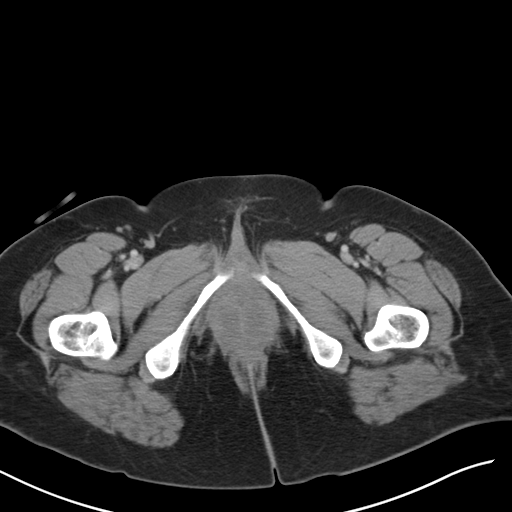
[im 5/94  bone]
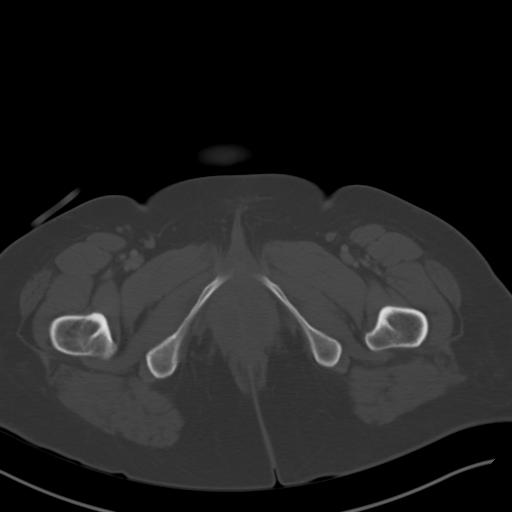
[im 14/94  soft-tissue]
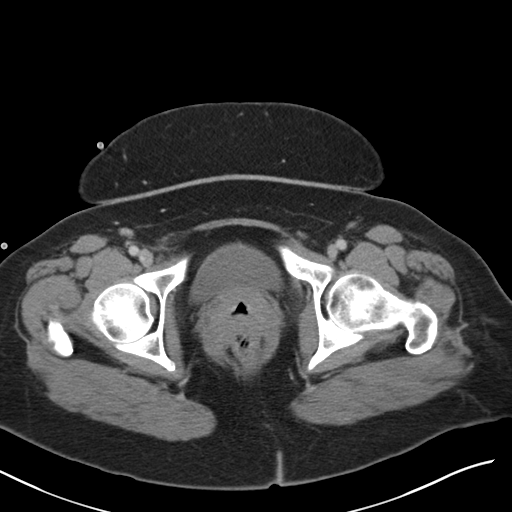
[im 19/94  soft-tissue]
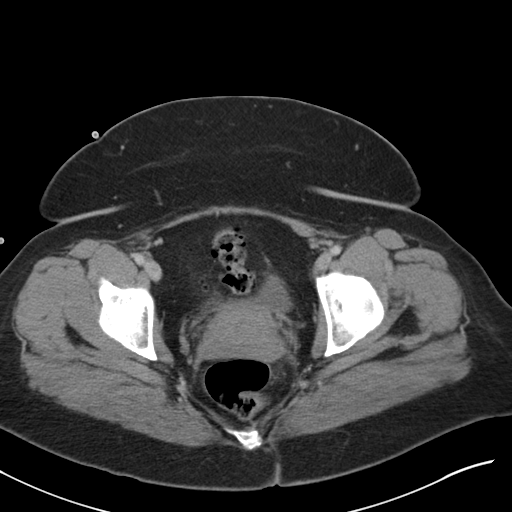
[im 28/94  soft-tissue]
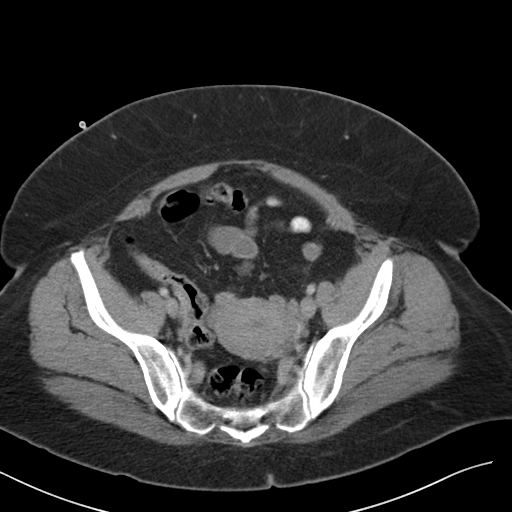
[im 33/94  soft-tissue]
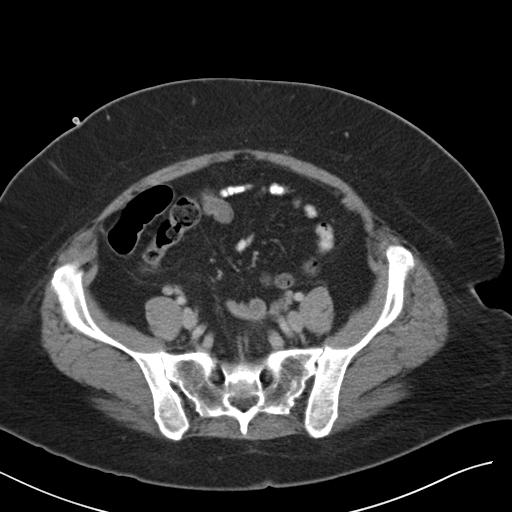
[im 42/94  soft-tissue]
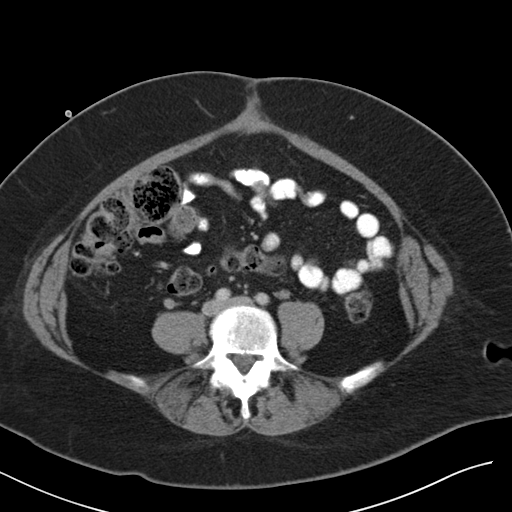
[im 47/94  soft-tissue]
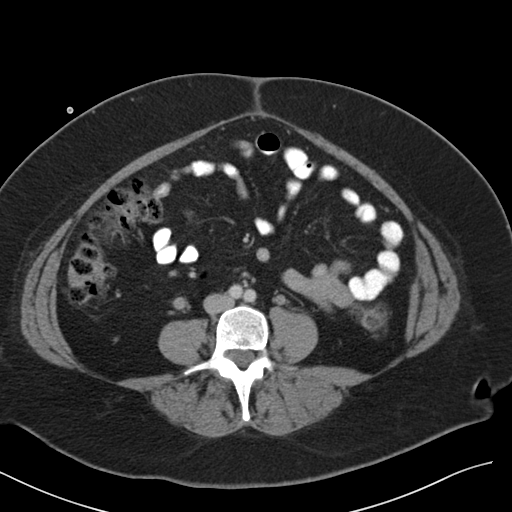
[im 52/94  soft-tissue]
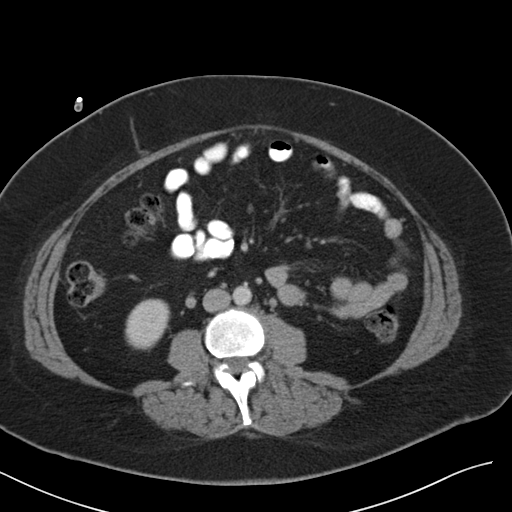
[im 61/94  soft-tissue]
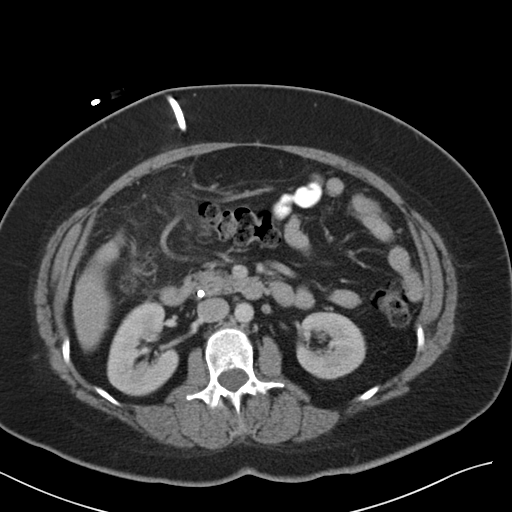
[im 61/94  bone]
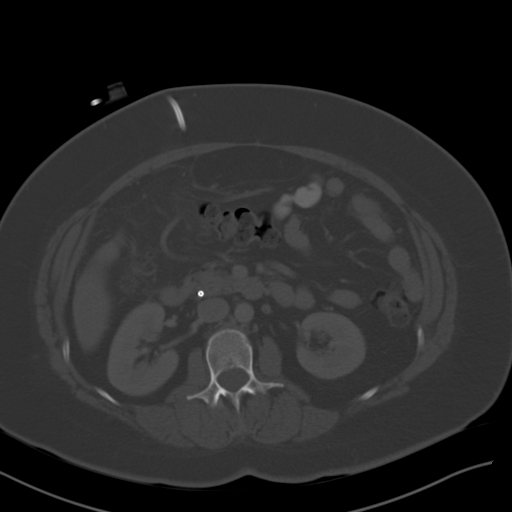
[im 66/94  soft-tissue]
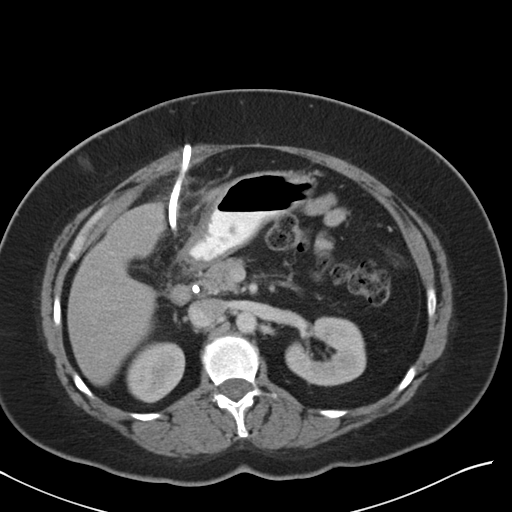
[im 75/94  soft-tissue]
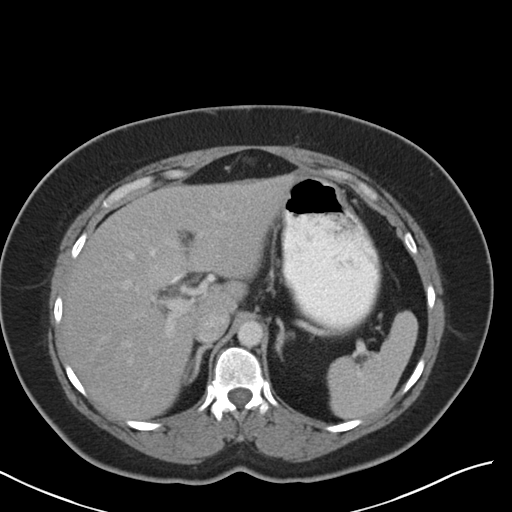
[im 75/94  lung]
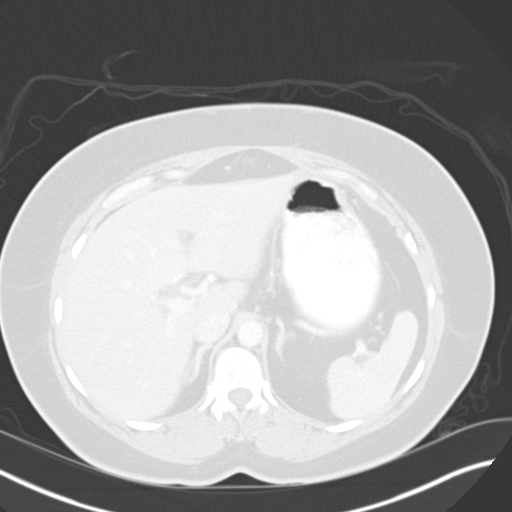
[im 80/94  soft-tissue]
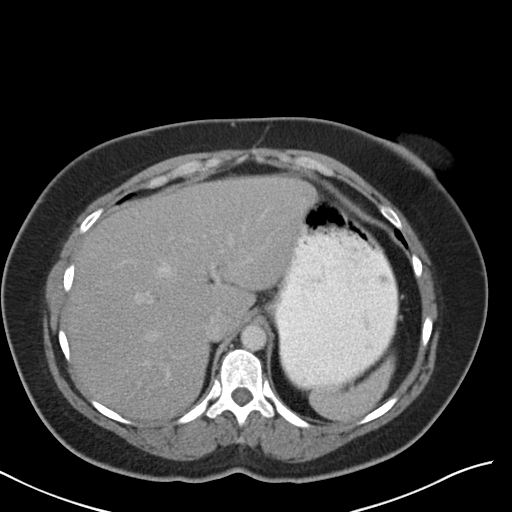
[im 80/94  lung]
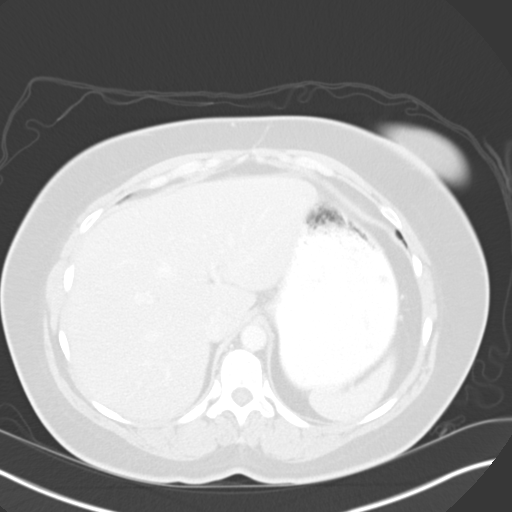
[im 84/94  lung]
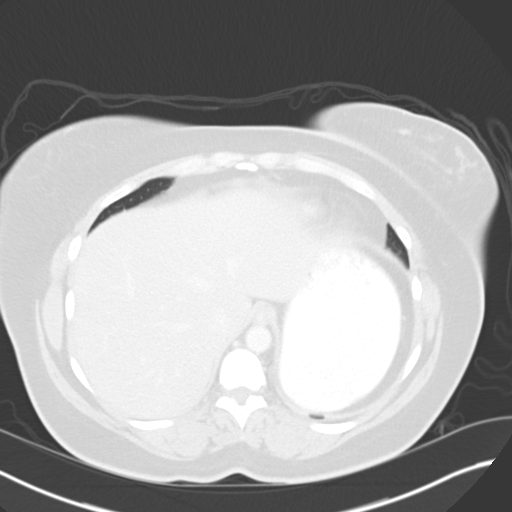
[im 89/94  soft-tissue]
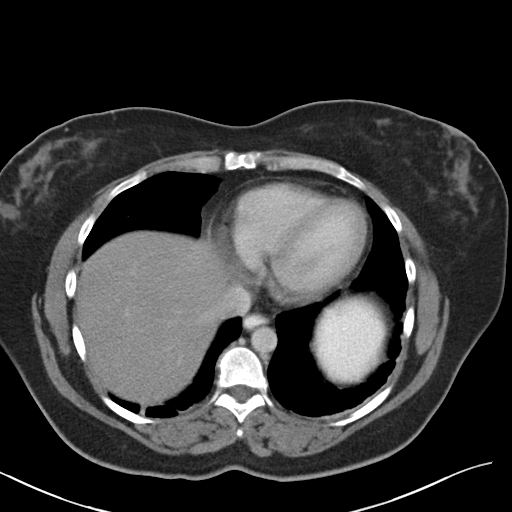
[im 89/94  lung]
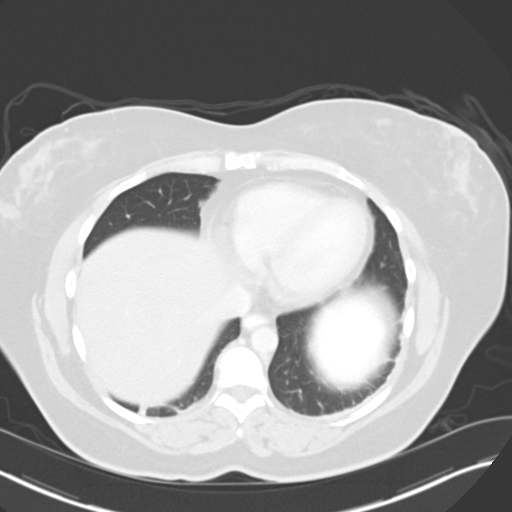

[14 of 32 positions shown; findings below may reference images not displayed]

FINDINGS: Pigtail catheter has been placed into the gallbladder
fossa fluid collection via S side hepatic approach.  The fluid
collection has almost completely resolved.  Minimal stranding in
the peroneal fat about the gallbladder fossa is present.

Common bile duct stent has been placed from the porta hepatis to
the duodenum.

Post cholecystectomy.  Kidneys, adrenal glands, spleen are stable
in appearance.  Pancreas is unremarkable.  No new abscess.

Trace free fluid is seen in the pelvis.  Uterus, bladder, and
adnexa are unremarkable.  Visualized appendix.

Lumbar degenerative disc disease.

Dependent atelectasis bilaterally.
IMPRESSION: Interval gallbladder fossa drain placement with near complete
resolution of the gallbladder fossa fluid collection.  Inflammatory
changes in the adjacent fat are present which may represent
chemical peritonitis bile leak.  No new abscess.

## 2012-10-12 ENCOUNTER — Ambulatory Visit: Payer: 59 | Admitting: Family Medicine

## 2012-10-12 ENCOUNTER — Encounter: Payer: Self-pay | Admitting: Family Medicine

## 2012-10-12 VITALS — Wt 207.0 lb

## 2012-10-12 DIAGNOSIS — M25569 Pain in unspecified knee: Secondary | ICD-10-CM

## 2012-10-12 DIAGNOSIS — M25562 Pain in left knee: Secondary | ICD-10-CM

## 2012-10-12 MED ORDER — LIDOCAINE HCL 2 % IJ SOLN: 3.0000 mL | Freq: Once | INTRAMUSCULAR | Status: DC

## 2012-10-12 MED ORDER — TRIAMCINOLONE ACETONIDE 40 MG/ML IJ SUSP: 3.0000 mg | Freq: Once | INTRAMUSCULAR | Status: DC

## 2012-10-12 NOTE — Progress Notes (Signed)
  Subjective:    Patient ID: Karen Roberts, female    DOB: March 19, 1971, 42 y.o.   MRN: [id number]  HPI She is here for evaluation of continued difficulty with left knee pain. She did have an injection of Xylocaine and epinephrine in 2012. She states that she got several months worth of benefit out of this. An MRI at that time did show some meniscal damage. She now states that for the last year she is essentially had chronic pain even at night. She has tried OTC medications without success. She's had no popping, locking or grinding or effusion. She is getting ready for a trip to see her daughter and grandchild. She would like an injection to help to have less pain on her trip.   Review of Systems     Objective:   Physical Exam Left knee exam shows no effusion. Anterior drawer negative. McMurray's testing was equivocal. Medial lateral collateral ligaments intact. Slight tenderness over the lower patellar tendon that she says was same area she had pain and got relief with the injection.       Assessment & Plan:  Recurrent knee pain, left - Plan: lidocaine (XYLOCAINE) 2 % (with pres) injection 60 mg, triamcinolone acetonide (KENALOG-40) injection 3.2 mg, MR Knee Left  Wo Contrast I explained that I would prefer to get an MRI and refer her since she continues to have difficulty. She would like an injection to help get through the trip to visit her daughter. I will schedule the MRI for when she returns. She tolerated the injection well.

## 2012-10-25 ENCOUNTER — Telehealth: Payer: Self-pay

## 2012-10-25 NOTE — Telephone Encounter (Signed)
CALLED PT TO INFORM HER SHE HAS APT FOR LEFT KNEE MRI  April 2 AT 6PM FOR 630PM APT AT [address] [facility] [phone] HAD TO LEAVE MESSAGE

## 2012-10-31 ENCOUNTER — Other Ambulatory Visit: Payer: 59

## 2013-01-01 ENCOUNTER — Other Ambulatory Visit: Payer: 59 | Admitting: Family Medicine

## 2014-04-05 ENCOUNTER — Encounter (HOSPITAL_COMMUNITY): Payer: Self-pay | Admitting: Emergency Medicine

## 2014-04-05 ENCOUNTER — Emergency Department (HOSPITAL_COMMUNITY)
Admission: EM | Admit: 2014-04-05 | Discharge: 2014-04-05 | Disposition: A | Discharge status: Home / Self Care | Payer: 59 | Attending: Emergency Medicine | Admitting: Emergency Medicine

## 2014-04-05 ENCOUNTER — Emergency Department (HOSPITAL_COMMUNITY): Payer: 59

## 2014-04-05 DIAGNOSIS — Z79899 Other long term (current) drug therapy: Secondary | ICD-10-CM | POA: Diagnosis not present

## 2014-04-05 DIAGNOSIS — R1032 Left lower quadrant pain: Secondary | ICD-10-CM | POA: Insufficient documentation

## 2014-04-05 DIAGNOSIS — Z8719 Personal history of other diseases of the digestive system: Secondary | ICD-10-CM | POA: Insufficient documentation

## 2014-04-05 DIAGNOSIS — F411 Generalized anxiety disorder: Secondary | ICD-10-CM | POA: Insufficient documentation

## 2014-04-05 DIAGNOSIS — R350 Frequency of micturition: Secondary | ICD-10-CM | POA: Insufficient documentation

## 2014-04-05 DIAGNOSIS — Z9889 Other specified postprocedural states: Secondary | ICD-10-CM | POA: Diagnosis not present

## 2014-04-05 DIAGNOSIS — R300 Dysuria: Secondary | ICD-10-CM | POA: Diagnosis not present

## 2014-04-05 DIAGNOSIS — Z9089 Acquired absence of other organs: Secondary | ICD-10-CM | POA: Insufficient documentation

## 2014-04-05 DIAGNOSIS — R51 Headache: Secondary | ICD-10-CM | POA: Insufficient documentation

## 2014-04-05 DIAGNOSIS — Z3202 Encounter for pregnancy test, result negative: Secondary | ICD-10-CM | POA: Diagnosis not present

## 2014-04-05 DIAGNOSIS — R110 Nausea: Secondary | ICD-10-CM | POA: Diagnosis not present

## 2014-04-05 DIAGNOSIS — N949 Unspecified condition associated with female genital organs and menstrual cycle: Secondary | ICD-10-CM | POA: Diagnosis not present

## 2014-04-05 DIAGNOSIS — Z87828 Personal history of other (healed) physical injury and trauma: Secondary | ICD-10-CM | POA: Insufficient documentation

## 2014-04-05 LAB — URINALYSIS, ROUTINE W REFLEX MICROSCOPIC
Bilirubin Urine: NEGATIVE
Glucose, UA: NEGATIVE mg/dL
Hgb urine dipstick: NEGATIVE
Ketones, ur: NEGATIVE mg/dL
Leukocytes, UA: NEGATIVE
Nitrite: NEGATIVE
Protein, ur: NEGATIVE mg/dL
Specific Gravity, Urine: 1.026 (ref 1.005–1.030)
Urobilinogen, UA: 0.2 mg/dL (ref 0.0–1.0)
pH: 5.5 (ref 5.0–8.0)

## 2014-04-05 LAB — CBC
HCT: 38.1 % (ref 36.0–46.0)
Hemoglobin: 13 g/dL (ref 12.0–15.0)
MCH: 30.7 pg (ref 26.0–34.0)
MCHC: 34.1 g/dL (ref 30.0–36.0)
MCV: 90.1 fL (ref 78.0–100.0)
Platelets: 332 10*3/uL (ref 150–400)
RBC: 4.23 MIL/uL (ref 3.87–5.11)
RDW: 12.8 % (ref 11.5–15.5)
WBC: 9.9 10*3/uL (ref 4.0–10.5)

## 2014-04-05 LAB — BASIC METABOLIC PANEL
Anion gap: 14 (ref 5–15)
BUN: 14 mg/dL (ref 6–23)
CO2: 22 mEq/L (ref 19–32)
Calcium: 9.3 mg/dL (ref 8.4–10.5)
Chloride: 101 mEq/L (ref 96–112)
Creatinine, Ser: 0.82 mg/dL (ref 0.50–1.10)
GFR calc Af Amer: >90 mL/min (ref >90)
GFR calc non Af Amer: 86 mL/min — ABNORMAL LOW (ref >90)
Glucose, Bld: 110 mg/dL — ABNORMAL HIGH (ref 70–99)
Potassium: 4 mEq/L (ref 3.7–5.3)
Sodium: 137 mEq/L (ref 137–147)

## 2014-04-05 LAB — PREGNANCY, URINE: Preg Test, Ur: NEGATIVE

## 2014-04-05 LAB — WET PREP, GENITAL
Clue Cells Wet Prep HPF POC: NONE SEEN
Trich, Wet Prep: NONE SEEN
Yeast Wet Prep HPF POC: NONE SEEN

## 2014-04-05 MED ORDER — TRAMADOL HCL 50 MG PO TABS: 50.0000 mg | ORAL_TABLET | Freq: Four times a day (QID) | ORAL | Status: DC | PRN

## 2014-04-05 MED ORDER — PROMETHAZINE HCL 25 MG/ML IJ SOLN
25.0000 mg | Freq: Once | INTRAMUSCULAR | Status: AC
  Administered 2014-04-05: 25 mg via INTRAVENOUS
  Filled 2014-04-05: qty 1

## 2014-04-05 MED ORDER — HYDROMORPHONE HCL PF 1 MG/ML IJ SOLN
1.0000 mg | Freq: Once | INTRAMUSCULAR | Status: AC
  Administered 2014-04-05: 1 mg via INTRAVENOUS
  Filled 2014-04-05: qty 1

## 2014-04-05 MED ORDER — PROMETHAZINE HCL 25 MG PO TABS: 25.0000 mg | ORAL_TABLET | Freq: Four times a day (QID) | ORAL | Status: DC | PRN

## 2014-04-05 MED ORDER — ONDANSETRON HCL 4 MG/2ML IJ SOLN
4.0000 mg | Freq: Once | INTRAMUSCULAR | Status: AC
  Administered 2014-04-05: 4 mg via INTRAVENOUS
  Filled 2014-04-05: qty 2

## 2014-04-05 NOTE — ED Provider Notes (Signed)
CSN: [id number]     Arrival date & time 04/05/14  1642 History   First MD Initiated Contact with Patient 04/05/14 1652     Chief Complaint  Patient presents with  . Abdominal Pain     (Consider location/radiation/quality/duration/timing/severity/associated sxs/prior Treatment) HPI Pt is a 43yo female with hx of postoperative bile leak and hepatic steatosis presenting to ED with c/o LLQ abdominal pain that has been intermittent x3 days.  Pt states pain is sharp and stabbing, shooting and sore, 8/10 at worst. Pain is worse with palpation. Denies hx of similar symptoms but states she is concerned it is her ovary.  Denies hx of ovarian cysts or fibroids but does report hx of bartholian's cyst Jan 2015 tx by her OB/GYN.  Reports increased urinary frequency with occasional dysuria but denies decreased urine or hematuria. Denies fever, chills, vomiting or diarrhea. Denies vaginal discharge or concern for STDs. LMP 8/18, normal for pt, reports hx of irregular cycle.   Past Medical History  Diagnosis Date  . Allergy   . Anxiety   . PONV (postoperative nausea and vomiting)   . Ligament tear     left; "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  . Headache(784.0)     "every once in awhile"  . Postoperative bile leak   . Hepatic steatosis 02/24/11   Past Surgical History  Procedure Laterality Date  . Cesarean section  2000  . Appendectomy  2011  . Cholecystectomy  11/29/2011    Procedure: LAPAROSCOPIC CHOLECYSTECTOMY WITH INTRAOPERATIVE CHOLANGIOGRAM;  Surgeon: Imogene Burn. Georgette Dover, MD;  Location: [facility];  Service: General;  Laterality: N/A;  . Rlq abscess drain placement  12/12/11  . Ercp  12/14/2011    Procedure: ENDOSCOPIC RETROGRADE CHOLANGIOPANCREATOGRAPHY (ERCP);  Surgeon: Gatha Mayer, MD;  Location: [facility];  Service: Endoscopy;  Laterality: N/A;  . Ercp  01/05/2012    Procedure: ENDOSCOPIC RETROGRADE CHOLANGIOPANCREATOGRAPHY (ERCP);  Surgeon: Gatha Mayer, MD;  Location: [facility];  Service: Gastroenterology;   Laterality: N/A;  . Esophagogastroduodenoscopy  02/07/2012    Procedure: ESOPHAGOGASTRODUODENOSCOPY (EGD);  Surgeon: Gatha Mayer, MD;  Location: [facility];  Service: Endoscopy;  Laterality: N/A;  endo with stent removal/ no dye needed/may need ercp scope/need xray   Family History  Problem Relation Age of Onset  . Lung cancer Maternal Grandmother   . Brain cancer Maternal Grandmother   . Anesthesia problems Neg Hx   . Colon cancer Neg Hx   . Diabetes Mother   . Heart failure Mother    History  Substance Use Topics  . Smoking status: Never Smoker   . Smokeless tobacco: Never Used  . Alcohol Use: Yes     Comment: 12/12/11 "maybe once/ monthly [place] iced teas"   OB History   Grav Para Term Preterm Abortions TAB SAB Ect Mult Living                 Review of Systems  Constitutional: Negative for fever, chills and appetite change.  Respiratory: Negative for cough and shortness of breath.   Cardiovascular: Negative for chest pain and palpitations.  Gastrointestinal: Positive for nausea ( intermittent) and abdominal pain. Negative for vomiting, diarrhea, constipation and blood in stool.  Genitourinary: Positive for dysuria, frequency and pelvic pain ( left side). Negative for urgency, hematuria, flank pain, decreased urine volume, vaginal bleeding, vaginal discharge and vaginal pain.  Musculoskeletal: Negative for back pain and myalgias.  All other systems reviewed and are negative.     Allergies  Ciprofloxacin;  Morphine and related; and Pork-derived products  Home Medications   Prior to Admission medications   Medication Sig Start Date End Date Taking? Authorizing Provider  calcium-vitamin D (OSCAL WITH D) 500-200 MG-UNIT per tablet Take 2 tablets by mouth daily with breakfast.   Yes Historical Provider, MD  Cranberry 1000 MG CAPS Take 1,000 mg by mouth daily.   Yes Historical Provider, MD  cyanocobalamin 2000 MCG tablet Take 2,000 mcg by mouth daily.   Yes Historical  Provider, MD  EPINEPHrine (EPIPEN) 0.3 mg/0.3 mL DEVI Inject 0.3 mLs (0.3 mg total) into the muscle as needed. 07/16/12  Yes Domenic Moras, PA-C  vitamin C (ASCORBIC ACID) 500 MG tablet Take 1,000 mg by mouth daily.   Yes Historical Provider, MD  promethazine (PHENERGAN) 25 MG tablet Take 1 tablet (25 mg total) by mouth every 6 (six) hours as needed for nausea or vomiting. 04/05/14   Noland Fordyce, PA-C  traMADol (ULTRAM) 50 MG tablet Take 1 tablet (50 mg total) by mouth every 6 (six) hours as needed. 04/05/14   Noland Fordyce, PA-C   BP 111/75  Pulse 86  Temp(Src) 98.3 F (36.8 C) (Oral)  Resp 18  Ht 5\' 5"  (1.651 m)  Wt 210 lb (95.255 kg)  BMI 34.95 kg/m2  SpO2 100%  LMP 03/18/2014 Physical Exam  Nursing note and vitals reviewed. Constitutional: She appears well-developed and well-nourished.  Pt lying on right side in exam bed, non-toxic appearing, however, does look uncomfortable.   HENT:  Head: Normocephalic and atraumatic.  Eyes: Conjunctivae are normal. No scleral icterus.  Neck: Normal range of motion.  Cardiovascular: Normal rate, regular rhythm and normal heart sounds.   Pulmonary/Chest: Effort normal and breath sounds normal. No respiratory distress. She has no wheezes. She has no rales. She exhibits no tenderness.  Abdominal: Soft. Bowel sounds are normal. She exhibits no distension and no mass. There is tenderness. There is guarding. There is no rebound.  Soft obese abdomen, severe tenderness with guarding in LLQ. No masses palpated.  No CVAT  Genitourinary:  Chaperoned exam: performed after pain medication given. Normal external genitalia. Small amount white-clear discharge. No CMT, adnexal tenderness or masses. No vaginal bleeding.   Musculoskeletal: Normal range of motion.  Neurological: She is alert.  Skin: Skin is warm and dry.    ED Course  Procedures (including critical care time) Labs Review Labs Reviewed  WET PREP, GENITAL - Abnormal; Notable for the following:     WBC, Wet Prep HPF POC FEW (*)    All other components within normal limits  BASIC METABOLIC PANEL - Abnormal; Notable for the following:    Glucose, Bld 110 (*)    GFR calc non Af Amer 86 (*)    All other components within normal limits  GC/CHLAMYDIA PROBE AMP  URINALYSIS, ROUTINE W REFLEX MICROSCOPIC  CBC  PREGNANCY, URINE    Imaging Review US Transvaginal Non-ob  04/05/2014   CLINICAL DATA:  Left lower quadrant pain and tenderness.  EXAM: TRANSABDOMINAL AND TRANSVAGINAL ULTRASOUND OF PELVIS  TECHNIQUE: Both transabdominal and transvaginal ultrasound examinations of the pelvis were performed. Transabdominal technique was performed for global imaging of the pelvis including uterus, ovaries, adnexal regions, and pelvic cul-de-sac. It was necessary to proceed with endovaginal exam following the transabdominal exam to visualize the uterus and adnexa.  COMPARISON:  CT abdomen and pelvis 01/04/2012  FINDINGS: Uterus  Measurements: 8.7 x 4.5 x 5.6 cm. Slightly heterogeneous echotexture without mass or focal abnormality seen.  Endometrium  Thickness:  6 mm.  No focal abnormality visualized.  Right ovary  Measurements: 3.4 x 1.9 x 2.8 cm. 1.7 x 1.2 x 1.4 cystic lesion with intermediate echogenic material with concave border up against the cyst wall with at least 1 image demonstrating a slightly reticular appearance and no evidence of internal vascularity by color Doppler.  Left ovary  Measurements: 2.5 x 1.6 x 1.9 cm. Normal appearance/no adnexal mass.  Other findings  Small amount of free fluid noted in the pelvis.  IMPRESSION: 1. Unremarkable appearance of the left ovary. 2. 1.7 cm benign-appearing cystic lesion in the right ovary, most likely a hemorrhagic cyst. 3. Small amount of pelvic free fluid.   Electronically Signed   By: Logan Bores   On: 04/05/2014 20:30   US Pelvis Complete  04/05/2014   CLINICAL DATA:  Left lower quadrant pain and tenderness.  EXAM: TRANSABDOMINAL AND TRANSVAGINAL ULTRASOUND OF  PELVIS  TECHNIQUE: Both transabdominal and transvaginal ultrasound examinations of the pelvis were performed. Transabdominal technique was performed for global imaging of the pelvis including uterus, ovaries, adnexal regions, and pelvic cul-de-sac. It was necessary to proceed with endovaginal exam following the transabdominal exam to visualize the uterus and adnexa.  COMPARISON:  CT abdomen and pelvis 01/04/2012  FINDINGS: Uterus  Measurements: 8.7 x 4.5 x 5.6 cm. Slightly heterogeneous echotexture without mass or focal abnormality seen.  Endometrium  Thickness: 6 mm.  No focal abnormality visualized.  Right ovary  Measurements: 3.4 x 1.9 x 2.8 cm. 1.7 x 1.2 x 1.4 cystic lesion with intermediate echogenic material with concave border up against the cyst wall with at least 1 image demonstrating a slightly reticular appearance and no evidence of internal vascularity by color Doppler.  Left ovary  Measurements: 2.5 x 1.6 x 1.9 cm. Normal appearance/no adnexal mass.  Other findings  Small amount of free fluid noted in the pelvis.  IMPRESSION: 1. Unremarkable appearance of the left ovary. 2. 1.7 cm benign-appearing cystic lesion in the right ovary, most likely a hemorrhagic cyst. 3. Small amount of pelvic free fluid.   Electronically Signed   By: Logan Bores   On: 04/05/2014 20:30     EKG Interpretation None      MDM   Final diagnoses:  LLQ pain    Pt is a 43yo female c/o LLQ abdominal pain that has been intermittent x3 days, associated nausea and dysuria.  Pt is severely tender in LLQ, requested pain medication prior to pelvic exam or U/S.    5:44 PM  Pain has improved after 1mg  IV dilaudid. Pt c/o nausea, zofran ordered. Pelvic exam, small amount of white-clear discharge, otherwise unremarkable. Not CMT, adnexal tenderness or masses.  Labs: unremarkable. Pelvic U/S: unremarkable. No findings that can explain pt's pain.  No evidence of ovarian torsion, ectopic, UTI, low concern for diverticulitis.   Will discharge pt home with pain and nausea medication and to f/u with PCP and OB/GYN in the next 3-4 days. Return precautions provided. Pt verbalized understanding and agreement with tx plan.      Noland Fordyce, PA-C 04/05/14 2059

## 2014-04-05 NOTE — ED Notes (Signed)
Patient states she thinks the pain medication made her dizzy and does not want to take that anymore.

## 2014-04-05 NOTE — Discharge Instructions (Signed)
Abdominal Pain, Women °Abdominal (stomach, pelvic, or belly) pain can be caused by many things. It is important to tell your doctor: °· The location of the pain. °· Does it come and go or is it present all the time? °· Are there things that start the pain (eating certain foods, exercise)? °· Are there other symptoms associated with the pain (fever, nausea, vomiting, diarrhea)? °All of this is helpful to know when trying to find the cause of the pain. °CAUSES  °· Stomach: virus or bacteria infection, or ulcer. °· Intestine: appendicitis (inflamed appendix), regional ileitis (Crohn's disease), ulcerative colitis (inflamed colon), irritable bowel syndrome, diverticulitis (inflamed diverticulum of the colon), or cancer of the stomach or intestine. °· Gallbladder disease or stones in the gallbladder. °· Kidney disease, kidney stones, or infection. °· Pancreas infection or cancer. °· Fibromyalgia (pain disorder). °· Diseases of the female organs: °¨ Uterus: fibroid (non-cancerous) tumors or infection. °¨ Fallopian tubes: infection or tubal pregnancy. °¨ Ovary: cysts or tumors. °¨ Pelvic adhesions (scar tissue). °¨ Endometriosis (uterus lining tissue growing in the pelvis and on the pelvic organs). °¨ Pelvic congestion syndrome (female organs filling up with blood just before the menstrual period). °¨ Pain with the menstrual period. °¨ Pain with ovulation (producing an egg). °¨ Pain with an IUD (intrauterine device, birth control) in the uterus. °¨ Cancer of the female organs. °· Functional pain (pain not caused by a disease, may improve without treatment). °· Psychological pain. °· Depression. °DIAGNOSIS  °Your doctor will decide the seriousness of your pain by doing an examination. °· Blood tests. °· X-rays. °· Ultrasound. °· CT scan (computed tomography, special type of X-ray). °· MRI (magnetic resonance imaging). °· Cultures, for infection. °· Barium enema (dye inserted in the large intestine, to better view it with  X-rays). °· Colonoscopy (looking in intestine with a lighted tube). °· Laparoscopy (minor surgery, looking in abdomen with a lighted tube). °· Major abdominal exploratory surgery (looking in abdomen with a large incision). °TREATMENT  °The treatment will depend on the cause of the pain.  °· Many cases can be observed and treated at home. °· Over-the-counter medicines recommended by your caregiver. °· Prescription medicine. °· Antibiotics, for infection. °· Birth control pills, for painful periods or for ovulation pain. °· Hormone treatment, for endometriosis. °· Nerve blocking injections. °· Physical therapy. °· Antidepressants. °· Counseling with a psychologist or psychiatrist. °· Minor or major surgery. °HOME CARE INSTRUCTIONS  °· Do not take laxatives, unless directed by your caregiver. °· Take over-the-counter pain medicine only if ordered by your caregiver. Do not take aspirin because it can cause an upset stomach or bleeding. °· Try a clear liquid diet (broth or water) as ordered by your caregiver. Slowly move to a bland diet, as tolerated, if the pain is related to the stomach or intestine. °· Have a thermometer and take your temperature several times a day, and record it. °· Bed rest and sleep, if it helps the pain. °· Avoid sexual intercourse, if it causes pain. °· Avoid stressful situations. °· Keep your follow-up appointments and tests, as your caregiver orders. °· If the pain does not go away with medicine or surgery, you may try: °¨ Acupuncture. °¨ Relaxation exercises (yoga, meditation). °¨ Group therapy. °¨ Counseling. °SEEK MEDICAL CARE IF:  °· You notice certain foods cause stomach pain. °· Your home care treatment is not helping your pain. °· You need stronger pain medicine. °· You want your IUD removed. °· You feel faint or   lightheaded. °· You develop nausea and vomiting. °· You develop a rash. °· You are having side effects or an allergy to your medicine. °SEEK IMMEDIATE MEDICAL CARE IF:  °· Your  pain does not go away or gets worse. °· You have a fever. °· Your pain is felt only in portions of the abdomen. The right side could possibly be appendicitis. The left lower portion of the abdomen could be colitis or diverticulitis. °· You are passing blood in your stools (bright red or black tarry stools, with or without vomiting). °· You have blood in your urine. °· You develop chills, with or without a fever. °· You pass out. °MAKE SURE YOU:  °· Understand these instructions. °· Will watch your condition. °· Will get help right away if you are not doing well or get worse. °Document Released: 05/15/2007 Document Revised: 12/02/2013 Document Reviewed: 06/04/2009 °[employer]® Patient Information ©2015 [employer]. This information is not intended to replace advice given to you by your health care provider. Make sure you discuss any questions you have with your health care provider. ° °

## 2014-04-05 NOTE — ED Notes (Signed)
Pt arrived to the ED with a  Complaint of abdominal pain.  Pt states that the pain is located lower left abdomen .  Pt states the pain started on Thursday last.  Pt states that she also has painful urination

## 2014-04-08 LAB — GC/CHLAMYDIA PROBE AMP
CT Probe RNA: NEGATIVE
GC Probe RNA: NEGATIVE

## 2014-04-09 NOTE — ED Provider Notes (Signed)
Medical screening examination/treatment/procedure(s) were performed by non-physician practitioner and as supervising physician I was immediately available for consultation/collaboration.   EKG Interpretation None        Ephraim Hamburger, MD 04/09/14 [phone]

## 2017-01-13 ENCOUNTER — Encounter (HOSPITAL_COMMUNITY): Payer: Self-pay | Admitting: Emergency Medicine

## 2017-01-13 ENCOUNTER — Emergency Department (HOSPITAL_COMMUNITY)
Admission: EM | Admit: 2017-01-13 | Discharge: 2017-01-13 | Disposition: A | Discharge status: Home / Self Care | Payer: 59 | Attending: Emergency Medicine | Admitting: Emergency Medicine

## 2017-01-13 DIAGNOSIS — R350 Frequency of micturition: Secondary | ICD-10-CM | POA: Diagnosis not present

## 2017-01-13 DIAGNOSIS — R109 Unspecified abdominal pain: Secondary | ICD-10-CM | POA: Diagnosis present

## 2017-01-13 DIAGNOSIS — Z79899 Other long term (current) drug therapy: Secondary | ICD-10-CM | POA: Insufficient documentation

## 2017-01-13 DIAGNOSIS — N390 Urinary tract infection, site not specified: Secondary | ICD-10-CM | POA: Diagnosis not present

## 2017-01-13 LAB — COMPREHENSIVE METABOLIC PANEL
ALT: 35 U/L (ref 14–54)
AST: 27 U/L (ref 15–41)
Albumin: 4 g/dL (ref 3.5–5.0)
Alkaline Phosphatase: 119 U/L (ref 38–126)
Anion gap: 10 (ref 5–15)
BUN: 15 mg/dL (ref 6–20)
CO2: 26 mmol/L (ref 22–32)
Calcium: 9 mg/dL (ref 8.9–10.3)
Chloride: 103 mmol/L (ref 101–111)
Creatinine, Ser: 0.88 mg/dL (ref 0.44–1.00)
GFR calc Af Amer: >60 mL/min (ref >60)
GFR calc non Af Amer: >60 mL/min (ref >60)
Glucose, Bld: 117 mg/dL — ABNORMAL HIGH (ref 65–99)
Potassium: 4.2 mmol/L (ref 3.5–5.1)
Sodium: 139 mmol/L (ref 135–145)
Total Bilirubin: 0.6 mg/dL (ref 0.3–1.2)
Total Protein: 7.3 g/dL (ref 6.5–8.1)

## 2017-01-13 LAB — CBC
HCT: 39.9 % (ref 36.0–46.0)
Hemoglobin: 13.3 g/dL (ref 12.0–15.0)
MCH: 29.4 pg (ref 26.0–34.0)
MCHC: 33.3 g/dL (ref 30.0–36.0)
MCV: 88.1 fL (ref 78.0–100.0)
Platelets: 363 10*3/uL (ref 150–400)
RBC: 4.53 MIL/uL (ref 3.87–5.11)
RDW: 13.7 % (ref 11.5–15.5)
WBC: 8 10*3/uL (ref 4.0–10.5)

## 2017-01-13 LAB — URINALYSIS, ROUTINE W REFLEX MICROSCOPIC
Bilirubin Urine: NEGATIVE
Glucose, UA: NEGATIVE mg/dL
Hgb urine dipstick: NEGATIVE
Ketones, ur: NEGATIVE mg/dL
Nitrite: NEGATIVE
Protein, ur: NEGATIVE mg/dL
Specific Gravity, Urine: 1.021 (ref 1.005–1.030)
pH: 7 (ref 5.0–8.0)

## 2017-01-13 LAB — LIPASE, BLOOD: Lipase: 25 U/L (ref 11–51)

## 2017-01-13 LAB — I-STAT BETA HCG BLOOD, ED (MC, WL, AP ONLY): I-stat hCG, quantitative: <5 m[IU]/mL (ref <5)

## 2017-01-13 MED ORDER — CEPHALEXIN 500 MG PO CAPS: 500.0000 mg | ORAL_CAPSULE | Freq: Three times a day (TID) | ORAL | 0 refills | Status: DC

## 2017-01-13 MED ORDER — CEPHALEXIN 500 MG PO CAPS
500.0000 mg | ORAL_CAPSULE | Freq: Once | ORAL | Status: AC
  Administered 2017-01-13: 500 mg via ORAL
  Filled 2017-01-13: qty 1

## 2017-01-13 NOTE — ED Triage Notes (Signed)
Pt c/o lower abdominal discomfort, urinary frequency, chills.  No dysuria. Symptoms consistent with her typical UTI symptoms. Pt also reports coughing, URI symptoms x 1 week, resolving.

## 2017-01-14 ENCOUNTER — Encounter (HOSPITAL_COMMUNITY): Payer: Self-pay | Admitting: Nurse Practitioner

## 2017-01-14 ENCOUNTER — Emergency Department (HOSPITAL_COMMUNITY): Payer: 59

## 2017-01-14 ENCOUNTER — Emergency Department (HOSPITAL_COMMUNITY)
Admission: EM | Admit: 2017-01-14 | Discharge: 2017-01-14 | Disposition: A | Discharge status: Home / Self Care | Payer: 59 | Attending: Emergency Medicine | Admitting: Emergency Medicine

## 2017-01-14 DIAGNOSIS — R0602 Shortness of breath: Secondary | ICD-10-CM | POA: Insufficient documentation

## 2017-01-14 DIAGNOSIS — R5381 Other malaise: Secondary | ICD-10-CM

## 2017-01-14 MED ORDER — CEPHALEXIN 500 MG PO CAPS
500.0000 mg | ORAL_CAPSULE | Freq: Once | ORAL | Status: AC
  Administered 2017-01-14: 500 mg via ORAL
  Filled 2017-01-14: qty 1

## 2017-01-14 NOTE — ED Triage Notes (Signed)
Pt was seen was evaluated several hours ago and treated with a diagnosis of UTI. She states she was told come back if she did not get better. She states she hasn't got better and that "something does not feel right."

## 2017-01-14 NOTE — ED Provider Notes (Signed)
[facility] DEPT Provider Note   CSN: [id number] Arrival date & time: 01/14/17  0041     History   Chief Complaint Chief Complaint  Patient presents with  . Does Not Feel Right  . Hemoptysis    HPI Karen Roberts is a 46 y.o. female.  Patient presents with complaint of "I don't feel right". She was seen earlier today and diagnosed with a UTI. After getting home she felt SOB that was worse with lying down. No fever or cough. NO vomiting, urinary symptoms unchanged.    The history is provided by the patient. No language interpreter was used.    Past Medical History:  Diagnosis Date  . Allergy   . Anxiety   . Headache(784.0)    "every once in awhile"  . Hepatic steatosis 02/24/11  . Ligament tear    left; "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  . PONV (postoperative nausea and vomiting)   . Postoperative bile leak     Patient Active Problem List   Diagnosis Date Noted  . Bile leak, postoperative 12/13/2011  . 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 APPENDECTOMY  2011  . CESAREAN SECTION  2000  . CHOLECYSTECTOMY  11/29/2011   Procedure: LAPAROSCOPIC CHOLECYSTECTOMY WITH INTRAOPERATIVE CHOLANGIOGRAM;  Surgeon: Imogene Burn. Georgette Dover, MD;  Location: [facility];  Service: General;  Laterality: N/A;  . ERCP  12/14/2011   Procedure: ENDOSCOPIC RETROGRADE CHOLANGIOPANCREATOGRAPHY (ERCP);  Surgeon: Gatha Mayer, MD;  Location: [facility];  Service: Endoscopy;  Laterality: N/A;  . ERCP  01/05/2012   Procedure: ENDOSCOPIC RETROGRADE CHOLANGIOPANCREATOGRAPHY (ERCP);  Surgeon: Gatha Mayer, MD;  Location: [facility];  Service: Gastroenterology;  Laterality: N/A;  . ESOPHAGOGASTRODUODENOSCOPY  02/07/2012   Procedure: ESOPHAGOGASTRODUODENOSCOPY (EGD);  Surgeon: Gatha Mayer, MD;  Location: [facility];  Service: Endoscopy;  Laterality: N/A;  endo with stent removal/ no dye needed/may need ercp scope/need xray  . RLQ abscess drain placement  12/12/11     OB History    No data available       Home Medications    Prior to Admission medications   Medication Sig Start Date End Date Taking? Authorizing Provider  calcium-vitamin D (OSCAL WITH D) 500-200 MG-UNIT per tablet Take 2 tablets by mouth daily with breakfast.    [provider]  cephALEXin (KEFLEX) 500 MG capsule Take 1 capsule (500 mg total) by mouth 3 (three) times daily. 01/13/17   Virgel Manifold, MD  Cranberry 1000 MG CAPS Take 1,000 mg by mouth daily.    [provider]  cyanocobalamin 2000 MCG tablet Take 2,000 mcg by mouth daily.    [provider]  EPINEPHrine (EPIPEN) 0.3 mg/0.3 mL DEVI Inject 0.3 mLs (0.3 mg total) into the muscle as needed. 07/16/12   Domenic Moras, PA-C  promethazine (PHENERGAN) 25 MG tablet Take 1 tablet (25 mg total) by mouth every 6 (six) hours as needed for nausea or vomiting. 04/05/14   Noe Gens, PA-C  traMADol (ULTRAM) 50 MG tablet Take 1 tablet (50 mg total) by mouth every 6 (six) hours as needed. 04/05/14   Noe Gens, PA-C  vitamin C (ASCORBIC ACID) 500 MG tablet Take 1,000 mg by mouth daily.    [provider]    Family History Family History  Problem Relation Age of Onset  . Lung cancer Maternal Grandmother   . Brain cancer Maternal Grandmother   . Diabetes Mother   . Heart failure Mother   .  Anesthesia problems Neg Hx   . Colon cancer Neg Hx     Social History Social History  Substance Use Topics  . Smoking status: Never Smoker  . Smokeless tobacco: Never Used  . Alcohol use Yes     Comment: 12/12/11 "maybe once/ monthly [place] iced teas"     Allergies   Ciprofloxacin; Morphine and related; and Pork-derived products   Review of Systems Review of Systems  Constitutional: Negative for chills and fever.  Respiratory: Positive for shortness of breath.   Cardiovascular: Negative.  Negative for chest pain.  Gastrointestinal: Negative.  Negative for vomiting.  Genitourinary:  Positive for frequency.  Musculoskeletal: Negative.   Skin: Negative.   Neurological: Negative.      Physical Exam Updated Vital Signs BP (!) 139/92 (BP Location: Right Arm)   Pulse 99   Temp 98.1 F (36.7 C) (Oral)   Resp 18   Ht 5\' 7"  (1.702 m)   Wt 95.3 kg (210 lb)   LMP 11/29/2016 (Approximate)   SpO2 95%   BMI 32.89 kg/m   Physical Exam  Constitutional: She is oriented to person, place, and time. She appears well-developed and well-nourished.  HENT:  Head: Normocephalic.  Neck: Normal range of motion. Neck supple.  Cardiovascular: Normal rate and regular rhythm.   Pulmonary/Chest: Effort normal and breath sounds normal.  Abdominal: Soft. Bowel sounds are normal. There is no tenderness. There is no rebound and no guarding.  Musculoskeletal: Normal range of motion.  Neurological: She is alert and oriented to person, place, and time.  Skin: Skin is warm and dry. No rash noted.  Psychiatric: She has a normal mood and affect.     ED Treatments / Results  Labs (all labs ordered are listed, but only abnormal results are displayed) Labs Reviewed - No data to display  EKG  EKG Interpretation None       Radiology Dg Chest 2 View  Result Date: 01/14/2017 CLINICAL DATA:  Shortness of breath and cough EXAM: CHEST  2 VIEW COMPARISON:  None. FINDINGS: The heart size and mediastinal contours are within normal limits. Both lungs are clear. The visualized skeletal structures are unremarkable. Surgical clips in the right upper quadrant IMPRESSION: No active cardiopulmonary disease. Electronically Signed   By: Donavan Foil M.D.   On: 01/14/2017 01:33    Procedures Procedures (including critical care time)  Medications Ordered in ED Medications - No data to display   Initial Impression / Assessment and Plan / ED Course  I have reviewed the triage vital signs and the nursing notes.  Pertinent labs & imaging results that were available during my care of the patient were  reviewed by me and considered in my medical decision making (see chart for details).     Patient was re-evaluated and found to be stable, and in NAD. No hypoxia or tachycardia. CXR clear. She is reassured and able to discharge home.   Final Clinical Impressions(s) / ED Diagnoses   Final diagnoses:  None   1. Malaise  New Prescriptions New Prescriptions   No medications on file     Charlann Lange, Hershal Coria 01/22/17 [place], [place], DO 01/23/17 [phone]

## 2017-01-14 NOTE — ED Notes (Signed)
Pt c/o difficulty breathing that is worse when laying down. Pt speaking in full sentences.

## 2017-01-14 NOTE — Discharge Instructions (Signed)
Follow up with your doctor if symptoms persist. Return here as needed.

## 2017-01-15 LAB — URINE CULTURE

## 2017-01-24 NOTE — ED Provider Notes (Signed)
[facility] DEPT Provider Note   CSN: [id number] Arrival date & time: 01/13/17  1731     History   Chief Complaint Chief Complaint  Patient presents with  . Abdominal Pain  . Cough    HPI Karen Roberts is a 46 y.o. female.  HPI   54yF with multiple complaints. 2-3d history of not feeling quite right. Thinks may have UTI. Crampy adbominal pain. Nausea. Urinary frequency. No dysuria or hematuria. No fever. No diarrhea. No cough or sob.   Past Medical History:  Diagnosis Date  . Allergy   . Anxiety   . Headache(784.0)    "every once in awhile"  . Hepatic steatosis 02/24/11  . Ligament tear    left; "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  . PONV (postoperative nausea and vomiting)   . Postoperative bile leak     Patient Active Problem List   Diagnosis Date Noted  . Bile leak, postoperative 12/13/2011  . 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 APPENDECTOMY  2011  . CESAREAN SECTION  2000  . CHOLECYSTECTOMY  11/29/2011   Procedure: LAPAROSCOPIC CHOLECYSTECTOMY WITH INTRAOPERATIVE CHOLANGIOGRAM;  Surgeon: Imogene Burn. Georgette Dover, MD;  Location: [facility];  Service: General;  Laterality: N/A;  . ERCP  12/14/2011   Procedure: ENDOSCOPIC RETROGRADE CHOLANGIOPANCREATOGRAPHY (ERCP);  Surgeon: Gatha Mayer, MD;  Location: [facility];  Service: Endoscopy;  Laterality: N/A;  . ERCP  01/05/2012   Procedure: ENDOSCOPIC RETROGRADE CHOLANGIOPANCREATOGRAPHY (ERCP);  Surgeon: Gatha Mayer, MD;  Location: [facility];  Service: Gastroenterology;  Laterality: N/A;  . ESOPHAGOGASTRODUODENOSCOPY  02/07/2012   Procedure: ESOPHAGOGASTRODUODENOSCOPY (EGD);  Surgeon: Gatha Mayer, MD;  Location: [facility];  Service: Endoscopy;  Laterality: N/A;  endo with stent removal/ no dye needed/may need ercp scope/need xray  . RLQ abscess drain placement  12/12/11    OB History    No data available       Home Medications    Prior to Admission medications   Medication  Sig Start Date End Date Taking? Authorizing Provider  calcium-vitamin D (OSCAL WITH D) 500-200 MG-UNIT per tablet Take 2 tablets by mouth daily with breakfast.    [provider]  cephALEXin (KEFLEX) 500 MG capsule Take 1 capsule (500 mg total) by mouth 3 (three) times daily. 01/13/17   Virgel Manifold, MD  Cranberry 1000 MG CAPS Take 1,000 mg by mouth daily.    [provider]  cyanocobalamin 2000 MCG tablet Take 2,000 mcg by mouth daily.    [provider]  EPINEPHrine (EPIPEN) 0.3 mg/0.3 mL DEVI Inject 0.3 mLs (0.3 mg total) into the muscle as needed. 07/16/12   Domenic Moras, PA-C  promethazine (PHENERGAN) 25 MG tablet Take 1 tablet (25 mg total) by mouth every 6 (six) hours as needed for nausea or vomiting. 04/05/14   Noe Gens, PA-C  traMADol (ULTRAM) 50 MG tablet Take 1 tablet (50 mg total) by mouth every 6 (six) hours as needed. 04/05/14   Noe Gens, PA-C  vitamin C (ASCORBIC ACID) 500 MG tablet Take 1,000 mg by mouth daily.    [provider]    Family History Family History  Problem Relation Age of Onset  . Lung cancer Maternal Grandmother   . Brain cancer Maternal Grandmother   . Diabetes Mother   . Heart failure Mother   . Anesthesia problems Neg Hx   . Colon cancer Neg Hx     Social History Social History  Substance Use Topics  . Smoking status: Never Smoker  . Smokeless tobacco: Never Used  . Alcohol use Yes     Comment: 12/12/11 "maybe once/ monthly [place] iced teas"     Allergies   Ciprofloxacin; Morphine and related; and Pork-derived products   Review of Systems Review of Systems  All systems reviewed and negative, other than as noted in HPI.   Physical Exam Updated Vital Signs BP (!) 148/83 (BP Location: Left Arm)   Pulse 96   Temp 97.5 F (36.4 C) (Oral)   Resp 18   SpO2 96%   Physical Exam  Constitutional: She appears well-developed and well-nourished. No distress.  HENT:  Head: Normocephalic and  atraumatic.  Eyes: Conjunctivae are normal. Right eye exhibits no discharge. Left eye exhibits no discharge.  Neck: Neck supple.  Cardiovascular: Normal rate, regular rhythm and normal heart sounds.  Exam reveals no gallop and no friction rub.   No murmur heard. Pulmonary/Chest: Effort normal and breath sounds normal. No respiratory distress.  Abdominal: Soft. She exhibits no distension. There is no tenderness.  Genitourinary:  Genitourinary Comments: No cva tenderness  Musculoskeletal: She exhibits no edema or tenderness.  Neurological: She is alert.  Skin: Skin is warm and dry.  Psychiatric: She has a normal mood and affect. Her behavior is normal. Thought content normal.  Nursing note and vitals reviewed.    ED Treatments / Results  Labs (all labs ordered are listed, but only abnormal results are displayed) Labs Reviewed  URINE CULTURE - Abnormal; Notable for the following:       Result Value   Culture MULTIPLE SPECIES PRESENT, SUGGEST RECOLLECTION (*)    All other components within normal limits  COMPREHENSIVE METABOLIC PANEL - Abnormal; Notable for the following:    Glucose, Bld 117 (*)    All other components within normal limits  URINALYSIS, ROUTINE W REFLEX MICROSCOPIC - Abnormal; Notable for the following:    APPearance HAZY (*)    Leukocytes, UA LARGE (*)    Bacteria, UA MANY (*)    Squamous Epithelial / LPF 6-30 (*)    All other components within normal limits  LIPASE, BLOOD  CBC  I-STAT BETA HCG BLOOD, ED ([facility], [facility], [facility] ONLY)    EKG  EKG Interpretation None       Radiology No results found.  Procedures Procedures (including critical care time)  Medications Ordered in ED Medications  cephALEXin (KEFLEX) capsule 500 mg (500 mg Oral Given 01/13/17 2120)     Initial Impression / Assessment and Plan / ED Course  I have reviewed the triage vital signs and the nursing notes.  Pertinent labs & imaging results that were available during my care of the  patient were reviewed by me and considered in my medical decision making (see chart for details).     45yF with numerous complaints including mild abdominal discomfort and urinary frequency. UA suggestive of UTI. Abx. Culture. It has been determined that no acute conditions requiring further emergency intervention are present at this time. The patient has been advised of the diagnosis and plan. I reviewed any labs and imaging including any potential incidental findings. We have discussed signs and symptoms that warrant return to the ED and they are listed in the discharge instructions.    Final Clinical Impressions(s) / ED Diagnoses   Final diagnoses:  Urinary tract infection without hematuria, site unspecified    New Prescriptions Discharge Medication List as of 01/13/2017  9:10 PM  START taking these medications   Details  cephALEXin (KEFLEX) 500 MG capsule Take 1 capsule (500 mg total) by mouth 3 (three) times daily., Starting Fri 01/13/2017, Print         Virgel Manifold, MD 01/24/17 1011

## 2017-03-25 ENCOUNTER — Encounter (HOSPITAL_COMMUNITY): Payer: Self-pay | Admitting: Emergency Medicine

## 2017-03-25 ENCOUNTER — Emergency Department (HOSPITAL_COMMUNITY)
Admission: EM | Admit: 2017-03-25 | Discharge: 2017-03-25 | Disposition: A | Discharge status: Home / Self Care | Payer: 59 | Attending: Emergency Medicine | Admitting: Emergency Medicine

## 2017-03-25 ENCOUNTER — Emergency Department (HOSPITAL_COMMUNITY): Payer: 59

## 2017-03-25 DIAGNOSIS — N10 Acute tubulo-interstitial nephritis: Secondary | ICD-10-CM | POA: Insufficient documentation

## 2017-03-25 DIAGNOSIS — N12 Tubulo-interstitial nephritis, not specified as acute or chronic: Secondary | ICD-10-CM

## 2017-03-25 DIAGNOSIS — R1030 Lower abdominal pain, unspecified: Secondary | ICD-10-CM | POA: Diagnosis present

## 2017-03-25 LAB — COMPREHENSIVE METABOLIC PANEL
ALBUMIN: 4.2 g/dL (ref 3.5–5.0)
ALT: 33 U/L (ref 14–54)
AST: 25 U/L (ref 15–41)
Alkaline Phosphatase: 101 U/L (ref 38–126)
Anion gap: 9 (ref 5–15)
BILIRUBIN TOTAL: 0.7 mg/dL (ref 0.3–1.2)
BUN: 11 mg/dL (ref 6–20)
CO2: 26 mmol/L (ref 22–32)
CREATININE: 0.87 mg/dL (ref 0.44–1.00)
Calcium: 9.3 mg/dL (ref 8.9–10.3)
Chloride: 104 mmol/L (ref 101–111)
GFR calc Af Amer: >60 mL/min (ref >60)
GLUCOSE: 126 mg/dL — AB (ref 65–99)
POTASSIUM: 3.5 mmol/L (ref 3.5–5.1)
Sodium: 139 mmol/L (ref 135–145)
TOTAL PROTEIN: 7.6 g/dL (ref 6.5–8.1)

## 2017-03-25 LAB — URINALYSIS, ROUTINE W REFLEX MICROSCOPIC
Bilirubin Urine: NEGATIVE
Glucose, UA: NEGATIVE mg/dL
Hgb urine dipstick: NEGATIVE
KETONES UR: NEGATIVE mg/dL
NITRITE: POSITIVE — AB
PROTEIN: NEGATIVE mg/dL
Specific Gravity, Urine: 1.02 (ref 1.005–1.030)
pH: 6 (ref 5.0–8.0)

## 2017-03-25 LAB — CBC
HEMATOCRIT: 41.5 % (ref 36.0–46.0)
Hemoglobin: 13.8 g/dL (ref 12.0–15.0)
MCH: 29.5 pg (ref 26.0–34.0)
MCHC: 33.3 g/dL (ref 30.0–36.0)
MCV: 88.7 fL (ref 78.0–100.0)
Platelets: 324 10*3/uL (ref 150–400)
RBC: 4.68 MIL/uL (ref 3.87–5.11)
RDW: 13.7 % (ref 11.5–15.5)
WBC: 8.8 10*3/uL (ref 4.0–10.5)

## 2017-03-25 LAB — I-STAT BETA HCG BLOOD, ED (MC, WL, AP ONLY): I-stat hCG, quantitative: <5 m[IU]/mL (ref <5)

## 2017-03-25 LAB — LIPASE, BLOOD: Lipase: 24 U/L (ref 11–51)

## 2017-03-25 MED ORDER — OXYCODONE-ACETAMINOPHEN 5-325 MG PO TABS: 1.0000 | ORAL_TABLET | Freq: Four times a day (QID) | ORAL | 0 refills | Status: DC | PRN

## 2017-03-25 MED ORDER — IOPAMIDOL (ISOVUE-300) INJECTION 61%
INTRAVENOUS | Status: AC
  Filled 2017-03-25: qty 100

## 2017-03-25 MED ORDER — IOPAMIDOL (ISOVUE-300) INJECTION 61%
100.0000 mL | Freq: Once | INTRAVENOUS | Status: AC | PRN
  Administered 2017-03-25: 100 mL via INTRAVENOUS

## 2017-03-25 MED ORDER — HYDROMORPHONE HCL 1 MG/ML IJ SOLN
0.5000 mg | Freq: Once | INTRAMUSCULAR | Status: AC
  Administered 2017-03-25: 0.5 mg via INTRAVENOUS
  Filled 2017-03-25: qty 1

## 2017-03-25 MED ORDER — CEPHALEXIN 500 MG PO CAPS: 500.0000 mg | ORAL_CAPSULE | Freq: Four times a day (QID) | ORAL | 0 refills | Status: AC

## 2017-03-25 MED ORDER — IBUPROFEN 200 MG PO TABS
400.0000 mg | ORAL_TABLET | Freq: Once | ORAL | Status: AC | PRN
  Administered 2017-03-25: 400 mg via ORAL
  Filled 2017-03-25: qty 2

## 2017-03-25 NOTE — Discharge Instructions (Signed)
Please read instructions below. Drink plenty of water. Begin taking the antibiotic, Keflex, 4 times per day until it is gone. You can take Percocet every 6 hours as needed for pain. Follow up with your primary care provider in 3 days.. Return to the ER for fever, chills, uncontrollable vomiting, or worsening symptoms.

## 2017-03-25 NOTE — ED Triage Notes (Addendum)
Pt reports L flank pain for the past 2 days. Pt reports she had similar pain before and was diagnosed with diverticulitis. Denies urinary symptoms. Pt uncomfortable in triage.

## 2017-03-25 NOTE — ED Provider Notes (Signed)
[facility] DEPT Provider Note   CSN: [id number] Arrival date & time: 03/25/17  1108     History   Chief Complaint Chief Complaint  Patient presents with  . Abdominal Pain    HPI Karen Roberts is a 46 y.o. female with past medical history of hepatic steatosis, anxiety, appendectomy, cholecystectomy, diverticulitis, presenting with acute onset of left lower abdominal/flank pain x2days that is intermittently sharp and worse with movement. States she took advil PTA with moderate relief of symptoms. Reports this pain feels similar to pain that occurred when she had diverticulitis. Denies urinary frequency, dysuria, hematuria, vaginal bleeding or discharge, diarrhea, constipation, N/C, F/C, SOB, CP, or any other symptoms. Last BM was today and normal. Denies hx of nephrolithiasis or pyelonephritis, however reports frequent UTIs. States she does not have a gastroenterologist in town.   The history is provided by the patient.    Past Medical History:  Diagnosis Date  . Allergy   . Anxiety   . Headache(784.0)    "every once in awhile"  . Hepatic steatosis 02/24/11  . Ligament tear    left; "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  . PONV (postoperative nausea and vomiting)   . Postoperative bile leak     Patient Active Problem List   Diagnosis Date Noted  . Bile leak, postoperative 12/13/2011  . 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 APPENDECTOMY  2011  . CESAREAN SECTION  2000  . CHOLECYSTECTOMY  11/29/2011   Procedure: LAPAROSCOPIC CHOLECYSTECTOMY WITH INTRAOPERATIVE CHOLANGIOGRAM;  Surgeon: Imogene Burn. Georgette Dover, MD;  Location: [facility];  Service: General;  Laterality: N/A;  . ERCP  12/14/2011   Procedure: ENDOSCOPIC RETROGRADE CHOLANGIOPANCREATOGRAPHY (ERCP);  Surgeon: Gatha Mayer, MD;  Location: [facility];  Service: Endoscopy;  Laterality: N/A;  . ERCP  01/05/2012   Procedure: ENDOSCOPIC RETROGRADE CHOLANGIOPANCREATOGRAPHY (ERCP);  Surgeon:  Gatha Mayer, MD;  Location: [facility];  Service: Gastroenterology;  Laterality: N/A;  . ESOPHAGOGASTRODUODENOSCOPY  02/07/2012   Procedure: ESOPHAGOGASTRODUODENOSCOPY (EGD);  Surgeon: Gatha Mayer, MD;  Location: [facility];  Service: Endoscopy;  Laterality: N/A;  endo with stent removal/ no dye needed/may need ercp scope/need xray  . RLQ abscess drain placement  12/12/11    OB History    No data available       Home Medications    Prior to Admission medications   Medication Sig Start Date End Date Taking? Authorizing Provider  EPINEPHrine (EPIPEN) 0.3 mg/0.3 mL DEVI Inject 0.3 mLs (0.3 mg total) into the muscle as needed. 07/16/12  Yes Domenic Moras, PA-C  ibuprofen (ADVIL,MOTRIN) 200 MG tablet Take 400-800 mg by mouth every 6 (six) hours as needed for headache, mild pain or moderate pain.   Yes [provider]  cephALEXin (KEFLEX) 500 MG capsule Take 1 capsule (500 mg total) by mouth 4 (four) times daily. 03/25/17 04/04/17  Russo, [place] N, PA-C  oxyCODONE-acetaminophen (PERCOCET/ROXICET) 5-325 MG tablet Take 1-2 tablets by mouth every 6 (six) hours as needed for severe pain. 03/25/17   Russo, [place] N, PA-C  promethazine (PHENERGAN) 25 MG tablet Take 1 tablet (25 mg total) by mouth every 6 (six) hours as needed for nausea or vomiting. Patient not taking: Reported on 03/25/2017 04/05/14   Noe Gens, PA-C  traMADol (ULTRAM) 50 MG tablet Take 1 tablet (50 mg total) by mouth every 6 (six) hours as needed. Patient not taking: Reported on 03/25/2017 04/05/14   Noe Gens, PA-C  Family History Family History  Problem Relation Age of Onset  . Lung cancer Maternal Grandmother   . Brain cancer Maternal Grandmother   . Diabetes Mother   . Heart failure Mother   . Anesthesia problems Neg Hx   . Colon cancer Neg Hx     Social History Social History  Substance Use Topics  . Smoking status: Never Smoker  . Smokeless tobacco: Never Used  . Alcohol use Yes     Comment: 12/12/11  "maybe once/ monthly [place] iced teas"     Allergies   Ciprofloxacin; Morphine and related; and Pork-derived products   Review of Systems Review of Systems  Constitutional: Negative for appetite change, chills and fever.  Respiratory: Negative for shortness of breath.   Cardiovascular: Negative for chest pain.  Gastrointestinal: Positive for abdominal pain. Negative for blood in stool, constipation, diarrhea, nausea and vomiting.  Genitourinary: Positive for flank pain. Negative for dysuria, frequency, hematuria, vaginal bleeding and vaginal discharge.  Musculoskeletal: Negative for back pain and myalgias.  Skin: Negative for color change.  Allergic/Immunologic: Negative for immunocompromised state.  All other systems reviewed and are negative.    Physical Exam Updated Vital Signs BP (!) 144/96 (BP Location: Right Arm)   Pulse 94   Temp 97.6 F (36.4 C) (Oral)   Resp 18   SpO2 100%   Physical Exam  Constitutional: She appears well-developed and well-nourished. No distress.  Pt appears to be in pain with small movements.  HENT:  Head: Normocephalic and atraumatic.  Mouth/Throat: Oropharynx is clear and moist.  Eyes: Conjunctivae are normal.  Cardiovascular: Normal rate, regular rhythm, normal heart sounds and intact distal pulses.  Exam reveals no friction rub.   No murmur heard. Pulmonary/Chest: Effort normal and breath sounds normal. No respiratory distress. She has no wheezes. She has no rales.  Abdominal: Soft. Normal appearance and bowel sounds are normal. She exhibits no distension and no mass. There is tenderness (LLQ, left flank). There is rebound. There is no guarding. No hernia.  No CVA tenderness  Neurological: She is alert.  Skin: Skin is warm.  Psychiatric: She has a normal mood and affect. Her behavior is normal.  Nursing note and vitals reviewed.    ED Treatments / Results  Labs (all labs ordered are listed, but only abnormal results are  displayed) Labs Reviewed  COMPREHENSIVE METABOLIC PANEL - Abnormal; Notable for the following:       Result Value   Glucose, Bld 126 (*)    All other components within normal limits  URINALYSIS, ROUTINE W REFLEX MICROSCOPIC - Abnormal; Notable for the following:    APPearance HAZY (*)    Nitrite POSITIVE (*)    Leukocytes, UA MODERATE (*)    Bacteria, UA MANY (*)    Squamous Epithelial / LPF 6-30 (*)    All other components within normal limits  LIPASE, BLOOD  CBC  I-STAT BETA HCG BLOOD, ED ([facility], [facility], [facility] ONLY)    EKG  EKG Interpretation None       Radiology Ct Abdomen Pelvis W Contrast  Result Date: 03/25/2017 CLINICAL DATA:  Left flank pain EXAM: CT ABDOMEN AND PELVIS WITH CONTRAST TECHNIQUE: Multidetector CT imaging of the abdomen and pelvis was performed using the standard protocol following bolus administration of intravenous contrast. CONTRAST:  13mL ISOVUE-300 IOPAMIDOL (ISOVUE-300) INJECTION 61% COMPARISON:  None. FINDINGS: Lower chest: Lung bases are clear. Hepatobiliary: Liver is within normal limits. Status post cholecystectomy. No intrahepatic or extrahepatic duct dilatation. Pancreas: Within  normal limits. Spleen: Within normal limits. Adrenals/Urinary Tract: Adrenal glands are within normal limits. Kidneys are within normal limits.  No hydronephrosis. Bladder is within normal limits. Stomach/Bowel: Stomach is within normal limits. No evidence of bowel obstruction. Status post appendectomy. Colonic diverticulosis, without evidence of diverticulitis. Vascular/Lymphatic: No evidence of abdominal aortic aneurysm. No suspicious abdominopelvic lymphadenopathy. Reproductive: Uterus is within normal limits. Bilateral ovaries are within normal limits. Other: No abdominopelvic ascites. Musculoskeletal: Mild degenerative changes of the visualized thoracolumbar spine. IMPRESSION: Colonic diverticulosis, without evidence of diverticulitis. No evidence of bowel obstruction.  Prior  appendectomy. No CT findings to account for the patient's left flank pain. Electronically Signed   By: Julian Hy M.D.   On: 03/25/2017 18:01    Procedures Procedures (including critical care time)  Medications Ordered in ED Medications  iopamidol (ISOVUE-300) 61 % injection (not administered)  ibuprofen (ADVIL,MOTRIN) tablet 400 mg (400 mg Oral Given 03/25/17 1247)  iopamidol (ISOVUE-300) 61 % injection 100 mL (100 mLs Intravenous Contrast Given 03/25/17 1736)  HYDROmorphone (DILAUDID) injection 0.5 mg (0.5 mg Intravenous Given 03/25/17 1853)     Initial Impression / Assessment and Plan / ED Course  I have reviewed the triage vital signs and the nursing notes.  Pertinent labs & imaging results that were available during my care of the patient were reviewed by me and considered in my medical decision making (see chart for details).     Pt w LLQ and L flank pain.  Patient with peritoneal signs on exam and history of diverticulitis, therefore CT abdomen was done which is negative for acute pathology. Patient is nontoxic, nonseptic appearing, in no apparent distress. Patient's pain and other symptoms adequately managed in emergency department.  Labs, imaging and vitals reviewed. Patient does not meet the SIRS or Sepsis criteria. U/A with infection. CBC, CMP, and lipase wnl. bhcg neg. Will treat for pyelonephritis w keflex and pain medication. Urine culture sent. Pt to follow up with PCP in 3 days. Pt given strict return precautions. Pt tolerating PO prior to discharge.  [place] Controlled Substance reporting System queried  Patient discussed with Dr. Leonette Monarch, who agrees with care plan.  Discussed results, findings, treatment and follow up. Patient advised of return precautions. Patient verbalized understanding and agreed with plan.   Final Clinical Impressions(s) / ED Diagnoses   Final diagnoses:  Pyelonephritis    New Prescriptions New Prescriptions   CEPHALEXIN  (KEFLEX) 500 MG CAPSULE    Take 1 capsule (500 mg total) by mouth 4 (four) times daily.   OXYCODONE-ACETAMINOPHEN (PERCOCET/ROXICET) 5-325 MG TABLET    Take 1-2 tablets by mouth every 6 (six) hours as needed for severe pain.     Russo, [place] N, PA-C 03/25/17 1911    Fatima Blank, MD 03/26/17 [id number]

## 2017-03-25 NOTE — ED Notes (Signed)
Pt requested a sprite, added that she was cleared to eat and drink. No emesis noted, pt tolerated PO intake.

## 2017-03-28 LAB — URINE CULTURE: Culture: >=100000 — AB

## 2017-03-29 ENCOUNTER — Telehealth: Payer: Self-pay | Admitting: Emergency Medicine

## 2017-03-29 NOTE — Telephone Encounter (Signed)
Post ED Visit - Positive Culture Follow-up  Culture report reviewed by antimicrobial stewardship pharmacist:  []  Elenor Quinones, Pharm.D. []  Heide Guile, Pharm.D., BCPS AQ-ID []  Parks Neptune, Pharm.D., BCPS []  Alycia Rossetti, Pharm.D., BCPS []  [place], [place].D., BCPS, AAHIVP []  Legrand Como, Pharm.D., BCPS, AAHIVP []  Salome Arnt, PharmD, BCPS []  Dimitri Ped, PharmD, BCPS []  Vincenza Hews, PharmD, BCPS Leeroy Cha PharmD  Positive urine culture Treated with cephalexin, organism sensitive to the same and no further patient follow-up is required at this time.  Hazle Nordmann 03/29/2017, 3:06 PM

## 2019-01-08 NOTE — Progress Notes (Deleted)
   Subjective:    Patient ID: Karen Roberts, female    DOB: 06-16-1971, 48 y.o.   MRN: [id number]  HPI No chief complaint on file.  She is new to the practice and here for a complete physical exam. Previous medical care: Last CPE:  Other providers:  Past medical history: Surgeries:  Family history: Mental Health History:  Social history: Lives with ***, works as ***, *** Smoking, drinking alcohol, drug use  Diet: *** Excerise: ***  Immunizations:  Health maintenance:  Mammogram: Colonoscopy: Last Gynecological Exam: Last Menstrual cycle: Pregnancies:  Last Dental Exam: Last Eye Exam:  Wears seatbelt always, uses sunscreen, smoke detectors in home and functioning, does not text while driving and feels safe in home environment.   Reviewed allergies, medications, past medical, surgical, family, and social history.   Review of Systems Review of Systems Constitutional: -fever, -chills, -sweats, -unexpected weight change,-fatigue ENT: -runny nose, -ear pain, -sore throat Cardiology:  -chest pain, -palpitations, -edema Respiratory: -cough, -shortness of breath, -wheezing Gastroenterology: -abdominal pain, -nausea, -vomiting, -diarrhea, -constipation  Hematology: -bleeding or bruising problems Musculoskeletal: -arthralgias, -myalgias, -joint swelling, -back pain Ophthalmology: -vision changes Urology: -dysuria, -difficulty urinating, -hematuria, -urinary frequency, -urgency Neurology: -headache, -weakness, -tingling, -numbness       Objective:   Physical Exam There were no vitals taken for this visit.  General Appearance:    Alert, cooperative, no distress, appears stated age  Head:    Normocephalic, without obvious abnormality, atraumatic  Eyes:    PERRL, conjunctiva/corneas clear, EOM's intact, fundi    benign  Ears:    Normal TM's and external ear canals  Nose:   Nares normal, mucosa normal, no drainage or sinus   tenderness  Throat:   Lips, mucosa, and  tongue normal; teeth and gums normal  Neck:   Supple, no lymphadenopathy;  thyroid:  no   enlargement/tenderness/nodules; no carotid   bruit or JVD  Back:    Spine nontender, no curvature, ROM normal, no CVA     tenderness  Lungs:     Clear to auscultation bilaterally without wheezes, rales or     ronchi; respirations unlabored  Chest Wall:    No tenderness or deformity   Heart:    Regular rate and rhythm, S1 and S2 normal, no murmur, rub   or gallop  Breast Exam:    No tenderness, masses, or nipple discharge or inversion.      No axillary lymphadenopathy  Abdomen:     Soft, non-tender, nondistended, normoactive bowel sounds,    no masses, no hepatosplenomegaly  Genitalia:    Normal external genitalia without lesions.  BUS and vagina normal; cervix without lesions, or cervical motion tenderness. No abnormal vaginal discharge.  Uterus and adnexa not enlarged, nontender, no masses.  Pap performed  Rectal:    Normal tone, no masses or tenderness; guaiac negative stool  Extremities:   No clubbing, cyanosis or edema  Pulses:   2+ and symmetric all extremities  Skin:   Skin color, texture, turgor normal, no rashes or lesions  Lymph nodes:   Cervical, supraclavicular, and axillary nodes normal  Neurologic:   CNII-XII intact, normal strength, sensation and gait; reflexes 2+ and symmetric throughout          Psych:   Normal mood, affect, hygiene and grooming.        Assessment & Plan:  Routine general medical examination at a health care facility

## 2019-01-09 ENCOUNTER — Encounter: Payer: Self-pay | Admitting: Family Medicine

## 2019-08-01 ENCOUNTER — Encounter (HOSPITAL_COMMUNITY): Payer: Self-pay

## 2019-08-01 ENCOUNTER — Emergency Department (HOSPITAL_COMMUNITY)
Admission: EM | Admit: 2019-08-01 | Discharge: 2019-08-02 | Disposition: A | Discharge status: Home / Self Care | Payer: Self-pay | Attending: Emergency Medicine | Admitting: Emergency Medicine

## 2019-08-01 ENCOUNTER — Other Ambulatory Visit: Payer: Self-pay

## 2019-08-01 DIAGNOSIS — R350 Frequency of micturition: Secondary | ICD-10-CM | POA: Insufficient documentation

## 2019-08-01 DIAGNOSIS — R739 Hyperglycemia, unspecified: Secondary | ICD-10-CM | POA: Insufficient documentation

## 2019-08-01 DIAGNOSIS — M62838 Other muscle spasm: Secondary | ICD-10-CM | POA: Insufficient documentation

## 2019-08-01 DIAGNOSIS — Z79899 Other long term (current) drug therapy: Secondary | ICD-10-CM | POA: Insufficient documentation

## 2019-08-01 LAB — URINALYSIS, ROUTINE W REFLEX MICROSCOPIC
Bacteria, UA: NONE SEEN
Bilirubin Urine: NEGATIVE
Glucose, UA: >=500 mg/dL — AB
Hgb urine dipstick: NEGATIVE
Ketones, ur: NEGATIVE mg/dL
Leukocytes,Ua: NEGATIVE
Nitrite: NEGATIVE
Protein, ur: NEGATIVE mg/dL
Specific Gravity, Urine: 1.029 (ref 1.005–1.030)
pH: 8 (ref 5.0–8.0)

## 2019-08-01 LAB — COMPREHENSIVE METABOLIC PANEL
ALT: 47 U/L — ABNORMAL HIGH (ref 0–44)
AST: 26 U/L (ref 15–41)
Albumin: 3.9 g/dL (ref 3.5–5.0)
Alkaline Phosphatase: 185 U/L — ABNORMAL HIGH (ref 38–126)
Anion gap: 11 (ref 5–15)
BUN: 21 mg/dL — ABNORMAL HIGH (ref 6–20)
CO2: 27 mmol/L (ref 22–32)
Calcium: 9.4 mg/dL (ref 8.9–10.3)
Chloride: 94 mmol/L — ABNORMAL LOW (ref 98–111)
Creatinine, Ser: 0.86 mg/dL (ref 0.44–1.00)
GFR calc Af Amer: >60 mL/min (ref >60)
GFR calc non Af Amer: >60 mL/min (ref >60)
Glucose, Bld: 620 mg/dL (ref 70–99)
Potassium: 4 mmol/L (ref 3.5–5.1)
Sodium: 132 mmol/L — ABNORMAL LOW (ref 135–145)
Total Bilirubin: 0.4 mg/dL (ref 0.3–1.2)
Total Protein: 7.3 g/dL (ref 6.5–8.1)

## 2019-08-01 LAB — CBG MONITORING, ED: Glucose-Capillary: 510 mg/dL (ref 70–99)

## 2019-08-01 MED ORDER — SODIUM CHLORIDE 0.9 % IV BOLUS
1000.0000 mL | Freq: Once | INTRAVENOUS | Status: AC
  Administered 2019-08-01: 1000 mL via INTRAVENOUS

## 2019-08-01 MED ORDER — DEXTROSE 50 % IV SOLN: 0.0000 mL | INTRAVENOUS | Status: DC | PRN

## 2019-08-01 MED ORDER — DEXTROSE-NACL 5-0.45 % IV SOLN: INTRAVENOUS | Status: DC

## 2019-08-01 MED ORDER — SODIUM CHLORIDE 0.9 % IV SOLN: INTRAVENOUS | Status: DC

## 2019-08-01 MED ORDER — INSULIN REGULAR(HUMAN) IN NACL 100-0.9 UT/100ML-% IV SOLN
INTRAVENOUS | Status: DC
  Administered 2019-08-02: 18 [IU]/h via INTRAVENOUS
  Filled 2019-08-01: qty 100

## 2019-08-01 NOTE — ED Triage Notes (Addendum)
Pt reports intermittent lower body spasms over the last month that worsened tonight. She states that she has a hx of her potassium dropping and she is concerned that may be there cause. She denies chest pain. A&Ox4. Ambulatory. Also requesting a UA.

## 2019-08-01 NOTE — ED Provider Notes (Signed)
[facility] DEPT Provider Note   CSN: [id number] Arrival date & time: 08/01/19  1932     History Chief Complaint  Patient presents with  . Spasms    Karen Roberts is a 48 y.o. female.  Patient presents today with complaint of muscle spasms, primarily in her thighs. She thinks her potassium may be low. She endorses urinary frequency, and thinks she may have a urinary tract infection. She is a non-insulin dependent diabetic, has not been on medication "for a long time" due to lack of insurance. She has numbness in her feet  The history is provided by the patient. No language interpreter was used.  Hyperglycemia Severity:  Severe Onset quality:  Unable to specify Timing:  Unable to specify Progression:  Unable to specify Chronicity:  Recurrent Diabetes status:  Controlled with oral medications Current diabetic therapy:  None (metformin previously) Context: noncompliance   Associated symptoms: polyuria   Associated symptoms: no abdominal pain, no chest pain, no fever, no shortness of breath and no vomiting        Past Medical History:  Diagnosis Date  . Allergy   . Anxiety   . Headache(784.0)    "every once in awhile"  . Hepatic steatosis 02/24/11  . Ligament tear    left; "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  . PONV (postoperative nausea and vomiting)   . Postoperative bile leak     Patient Active Problem List   Diagnosis Date Noted  . Bile leak, postoperative 12/13/2011  . 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 APPENDECTOMY  2011  . CESAREAN SECTION  2000  . CHOLECYSTECTOMY  11/29/2011   Procedure: LAPAROSCOPIC CHOLECYSTECTOMY WITH INTRAOPERATIVE CHOLANGIOGRAM;  Surgeon: Imogene Burn. Georgette Dover, MD;  Location: [facility];  Service: General;  Laterality: N/A;  . ERCP  12/14/2011   Procedure: ENDOSCOPIC RETROGRADE CHOLANGIOPANCREATOGRAPHY (ERCP);  Surgeon: Gatha Mayer, MD;  Location: [facility];  Service:  Endoscopy;  Laterality: N/A;  . ERCP  01/05/2012   Procedure: ENDOSCOPIC RETROGRADE CHOLANGIOPANCREATOGRAPHY (ERCP);  Surgeon: Gatha Mayer, MD;  Location: [facility];  Service: Gastroenterology;  Laterality: N/A;  . ESOPHAGOGASTRODUODENOSCOPY  02/07/2012   Procedure: ESOPHAGOGASTRODUODENOSCOPY (EGD);  Surgeon: Gatha Mayer, MD;  Location: [facility];  Service: Endoscopy;  Laterality: N/A;  endo with stent removal/ no dye needed/may need ercp scope/need xray  . RLQ abscess drain placement  12/12/11     OB History   No obstetric history on file.     Family History  Problem Relation Age of Onset  . Lung cancer Maternal Grandmother   . Brain cancer Maternal Grandmother   . Diabetes Mother   . Heart failure Mother   . Anesthesia problems Neg Hx   . Colon cancer Neg Hx     Social History   Tobacco Use  . Smoking status: Never Smoker  . Smokeless tobacco: Never Used  Substance Use Topics  . Alcohol use: Yes    Comment: 12/12/11 "maybe once/ monthly [place] [employer] teas"  . Drug use: No    Home Medications Prior to Admission medications   Medication Sig Start Date End Date Taking? Authorizing Provider  EPINEPHrine (EPIPEN) 0.3 mg/0.3 mL DEVI Inject 0.3 mLs (0.3 mg total) into the muscle as needed. 07/16/12   Domenic Moras, PA-C  ibuprofen (ADVIL,MOTRIN) 200 MG tablet Take 400-800 mg by mouth every 6 (six) hours as needed for headache, mild pain or moderate pain.    [provider]  oxyCODONE-acetaminophen (PERCOCET/ROXICET) 5-325 MG tablet Take 1-2 tablets by mouth every 6 (six) hours as needed for severe pain. 03/25/17   Robinson, [place] N, PA-C  promethazine (PHENERGAN) 25 MG tablet Take 1 tablet (25 mg total) by mouth every 6 (six) hours as needed for nausea or vomiting. Patient not taking: Reported on 03/25/2017 04/05/14   Noe Gens, PA-C  traMADol (ULTRAM) 50 MG tablet Take 1 tablet (50 mg total) by mouth every 6 (six) hours as needed. Patient not taking: Reported on  03/25/2017 04/05/14   Noe Gens, PA-C    Allergies    Ciprofloxacin, Morphine and related, and Pork-derived products  Review of Systems   Review of Systems  Constitutional: Negative for chills and fever.  Respiratory: Negative for shortness of breath.   Cardiovascular: Positive for leg swelling. Negative for chest pain.  Gastrointestinal: Negative for abdominal pain and vomiting.  Endocrine: Positive for polyuria.  Musculoskeletal: Positive for myalgias.  All other systems reviewed and are negative.   Physical Exam Updated Vital Signs BP (!) 184/107 (BP Location: Left Arm)   Pulse 77   Temp 98.7 F (37.1 C) (Oral)   Resp 16   SpO2 93%   Physical Exam Vitals and nursing note reviewed.  Constitutional:      Appearance: Normal appearance.  HENT:     Head: Normocephalic.     Nose: No congestion.     Mouth/Throat:     Mouth: Mucous membranes are moist.  Eyes:     Conjunctiva/sclera: Conjunctivae normal.  Cardiovascular:     Rate and Rhythm: Normal rate and regular rhythm.  Pulmonary:     Effort: Pulmonary effort is normal.     Breath sounds: Normal breath sounds.  Abdominal:     General: Bowel sounds are normal.     Palpations: Abdomen is soft.  Musculoskeletal:        General: Normal range of motion.     Cervical back: Neck supple.  Skin:    General: Skin is warm and dry.  Neurological:     Mental Status: She is alert and oriented to person, place, and time.  Psychiatric:        Mood and Affect: Mood normal.     ED Results / Procedures / Treatments   Labs (all labs ordered are listed, but only abnormal results are displayed) Labs Reviewed  COMPREHENSIVE METABOLIC PANEL - Abnormal; Notable for the following components:      Result Value   Sodium 132 (*)    Chloride 94 (*)    Glucose, Bld 620 (*)    BUN 21 (*)    ALT 47 (*)    Alkaline Phosphatase 185 (*)    All other components within normal limits  URINALYSIS, ROUTINE W REFLEX MICROSCOPIC -  Abnormal; Notable for the following components:   Color, Urine COLORLESS (*)    Glucose, UA >=500 (*)    All other components within normal limits    EKG None  Radiology No results found.  Procedures Procedures (including critical care time)  Medications Ordered in ED Medications  sodium chloride 0.9 % bolus 1,000 mL (has no administration in time range)    ED Course  I have reviewed the triage vital signs and the nursing notes.  Pertinent labs & imaging results that were available during my care of the patient were reviewed by me and considered in my medical decision making (see chart for details).    MDM Rules/Calculators/A&P  No indication of UTI. Corrected sodium of 144. Normal potassium and calcium levels No DKA or HHS. Patient given fluid bolus started on insulin drip for hyperglycemia.   Patient signed out to [facility] [place]. Anticipate discharge home with metformin rx and follow-up with [facility]. Final Clinical Impression(s) / ED Diagnoses Final diagnoses:  Muscle spasms of both lower extremities  Hyperglycemia    Rx / DC Orders ED Discharge Orders    None       Etta Quill, NP 08/02/19 [place], [place], DO 08/06/19 [phone]

## 2019-08-02 LAB — CBG MONITORING, ED: Glucose-Capillary: 253 mg/dL — ABNORMAL HIGH (ref 70–99)

## 2019-08-02 MED ORDER — METFORMIN HCL 500 MG PO TABS: 500.0000 mg | ORAL_TABLET | Freq: Two times a day (BID) | ORAL | 0 refills | Status: DC

## 2019-08-02 NOTE — ED Provider Notes (Signed)
Assumed care from NP Smith at shift change.  See prior notes for full H&P.  Briefly, 49 y.o. F here with cramping in her legs.  Has history of low potassium and was concerned for the same.  On CMP here, potassium is normal but glucose is 620.  She has been off of her Metformin for "a while".  Gap and bicarb are normal, not clinically concerning for DKA.  Plan: CBG control via glucose stabilizer.  Anticipate discharge, will need to restart Metformin and arrange PCP follow-up.  1:14 AM CBG now 253, down from 620.  Will need to re-start metformin and have her follow-up with PCP.  No insurance currently so will refer to wellness clinic for follow-up.  She may return here for any new/acute changes.   Larene Pickett, PA-C 08/02/19 0119    Fatima Blank, MD 08/02/19 [phone]

## 2019-08-02 NOTE — Discharge Instructions (Addendum)
Re-start your metformin as prescribed--- [facility] at your [facility] and has already been sent over.  Watch your carb and sugar intake to keep blood sugar down. Please follow-up with the wellness clinic-- this is a FREE clinic and can serve as your primary care doctor. Return here for any new/acute changes.

## 2020-02-07 ENCOUNTER — Encounter (HOSPITAL_COMMUNITY): Payer: Self-pay

## 2020-02-07 ENCOUNTER — Ambulatory Visit (HOSPITAL_COMMUNITY)
Admission: EM | Admit: 2020-02-07 | Discharge: 2020-02-07 | Disposition: A | Discharge status: Home / Self Care | Payer: Self-pay | Attending: Family Medicine | Admitting: Family Medicine

## 2020-02-07 DIAGNOSIS — R300 Dysuria: Secondary | ICD-10-CM

## 2020-02-07 DIAGNOSIS — N3090 Cystitis, unspecified without hematuria: Secondary | ICD-10-CM

## 2020-02-07 LAB — POCT URINALYSIS DIP (DEVICE)
Bilirubin Urine: NEGATIVE
Glucose, UA: 500 mg/dL — AB
Ketones, ur: NEGATIVE mg/dL
Nitrite: NEGATIVE
Protein, ur: 30 mg/dL — AB
Specific Gravity, Urine: 1.02 (ref 1.005–1.030)
Urobilinogen, UA: 0.2 mg/dL (ref 0.0–1.0)
pH: 7 (ref 5.0–8.0)

## 2020-02-07 MED ORDER — SULFAMETHOXAZOLE-TRIMETHOPRIM 800-160 MG PO TABS: 1.0000 | ORAL_TABLET | Freq: Two times a day (BID) | ORAL | 0 refills | Status: AC

## 2020-02-07 MED ORDER — PHENAZOPYRIDINE HCL 95 MG PO TABS: 95.0000 mg | ORAL_TABLET | Freq: Three times a day (TID) | ORAL | 0 refills | Status: DC | PRN

## 2020-02-07 NOTE — Discharge Instructions (Addendum)
Continue to drink plenty of water Take the antibiotic 2 times a day take 2 doses today Take Pyridium (phenazopyridine) up to 3 times a day.  This will stain your urine below.  This will help take away the urinary pain. Return as needed

## 2020-02-07 NOTE — ED Triage Notes (Signed)
Pt presents with complaints of burning when urinating and increased urinary frequency x 3 days. Pt taking AZO without relieve of the symptoms.

## 2020-02-07 NOTE — ED Provider Notes (Addendum)
[facility]    CSN: [id number] Arrival date & time: 02/07/20  1656      History   Chief Complaint Chief Complaint  Patient presents with  . Dysuria    HPI Karen Roberts is a 49 y.o. female.   HPI   Patient has dysuria and frequency for 3 days She is also having some pain at the end of urination No abdominal pain No flank pain No fever chills No nausea or vomiting No history of kidney stones or infection  Past Medical History:  Diagnosis Date  . Allergy   . Anxiety   . Headache(784.0)    "every once in awhile"  . Hepatic steatosis 02/24/11  . Ligament tear    left; "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  . PONV (postoperative nausea and vomiting)   . Postoperative bile leak     Patient Active Problem List   Diagnosis Date Noted  . Bile leak, postoperative 12/13/2011  . 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 APPENDECTOMY  2011  . CESAREAN SECTION  2000  . CHOLECYSTECTOMY  11/29/2011   Procedure: LAPAROSCOPIC CHOLECYSTECTOMY WITH INTRAOPERATIVE CHOLANGIOGRAM;  Surgeon: Imogene Burn. Georgette Dover, MD;  Location: [facility];  Service: General;  Laterality: N/A;  . ERCP  12/14/2011   Procedure: ENDOSCOPIC RETROGRADE CHOLANGIOPANCREATOGRAPHY (ERCP);  Surgeon: Gatha Mayer, MD;  Location: [facility];  Service: Endoscopy;  Laterality: N/A;  . ERCP  01/05/2012   Procedure: ENDOSCOPIC RETROGRADE CHOLANGIOPANCREATOGRAPHY (ERCP);  Surgeon: Gatha Mayer, MD;  Location: [facility];  Service: Gastroenterology;  Laterality: N/A;  . ESOPHAGOGASTRODUODENOSCOPY  02/07/2012   Procedure: ESOPHAGOGASTRODUODENOSCOPY (EGD);  Surgeon: Gatha Mayer, MD;  Location: [facility];  Service: Endoscopy;  Laterality: N/A;  endo with stent removal/ no dye needed/may need ercp scope/need xray  . RLQ abscess drain placement  12/12/11    OB History   No obstetric history on file.      Home Medications    Prior to Admission medications   Medication Sig Start  Date End Date Taking? Authorizing Provider  EPINEPHrine (EPIPEN) 0.3 mg/0.3 mL DEVI Inject 0.3 mLs (0.3 mg total) into the muscle as needed. 07/16/12   Domenic Moras, PA-C  metFORMIN (GLUCOPHAGE) 500 MG tablet Take 1 tablet (500 mg total) by mouth 2 (two) times daily with a meal. 08/02/19   Larene Pickett, PA-C  phenazopyridine (PYRIDIUM) 95 MG tablet Take 1 tablet (95 mg total) by mouth 3 (three) times daily as needed for pain. 02/07/20   Raylene Everts, MD  sulfamethoxazole-trimethoprim (BACTRIM DS) 800-160 MG tablet Take 1 tablet by mouth 2 (two) times daily for 7 days. 02/07/20 02/14/20  Raylene Everts, MD    Family History Family History  Problem Relation Age of Onset  . Lung cancer Maternal Grandmother   . Brain cancer Maternal Grandmother   . Diabetes Mother   . Heart failure Mother   . Anesthesia problems Neg Hx   . Colon cancer Neg Hx     Social History Social History   Tobacco Use  . Smoking status: Never Smoker  . Smokeless tobacco: Never Used  Substance Use Topics  . Alcohol use: Yes    Comment: 12/12/11 "maybe once/ monthly [employer] [place] [employer] te[employer]"  . Drug use: No     Allergies   Ciprofloxacin, Morphine and related, Pork-derived products, and Morphine   Review of Systems Review of Systems See HPI  Physical Exam Triage Vital Signs ED Triage  Vitals  Enc Vitals Group     BP 02/07/20 1752 (!) 140/94     Pulse Rate 02/07/20 1752 88     Resp 02/07/20 1752 (!) 21     Temp 02/07/20 1752 97.7 F (36.5 C)     Temp Source 02/07/20 1752 Oral     SpO2 02/07/20 1752 97 %     Weight --      Height --      Head Circumference --      Peak Flow --      Pain Score 02/07/20 1750 0     Pain Loc --      Pain Edu? --      Excl. in [place]? --    No data found.  Updated Vital Signs BP (!) 140/94 (BP Location: Right Arm)   Pulse 88   Temp 97.7 F (36.5 C) (Oral)   Resp (!) 21   LMP 11/29/2016 (Approximate)   SpO2 97%      Physical Exam Constitutional:       General: She is not in acute distress.    Appearance: She is well-developed.  HENT:     Head: Normocephalic and atraumatic.  Eyes:     Conjunctiva/sclera: Conjunctivae normal.     Pupils: Pupils are equal, round, and reactive to light.  Cardiovascular:     Rate and Rhythm: Normal rate.  Pulmonary:     Effort: Pulmonary effort is normal. No respiratory distress.  Abdominal:     Comments: Denies abdominal pain or flank pain  Musculoskeletal:        General: Normal range of motion.     Cervical back: Normal range of motion.  Skin:    General: Skin is warm and dry.  Neurological:     Mental Status: She is alert.      UC Treatments / Results  Labs (all labs ordered are listed, but only abnormal results are displayed) Labs Reviewed  POCT URINALYSIS DIP (DEVICE) - Abnormal; Notable for the following components:      Result Value   Glucose, UA 500 (*)    Hgb urine dipstick TRACE (*)    Protein, ur 30 (*)    Leukocytes,Ua SMALL (*)    All other components within normal limits    EKG   Radiology No results found.  Procedures Procedures (including critical care time)  Medications Ordered in UC Medications - No data to display  Initial Impression / Assessment and Plan / UC Course  I have reviewed the triage vital signs and the nursing notes.  Pertinent labs & imaging results that were available during my care of the patient were reviewed by me and considered in my medical decision making (see chart for details).      Final Clinical Impressions(s) / UC Diagnoses   Final diagnoses:  Cystitis     Discharge Instructions     Continue to drink plenty of water Take the antibiotic 2 times a day take 2 doses today Take Pyridium (phenazopyridine) up to 3 times a day.  This will stain your urine below.  This will help take away the urinary pain. Return as needed     ED Prescriptions    Medication Sig Dispense Auth. Provider   phenazopyridine (PYRIDIUM) 95 MG  tablet Take 1 tablet (95 mg total) by mouth 3 (three) times daily as needed for pain. 10 tablet Raylene Everts, MD   sulfamethoxazole-trimethoprim (BACTRIM DS) 800-160 MG tablet Take 1 tablet by mouth 2 (  two) times daily for 7 days. 14 tablet Raylene Everts, MD     PDMP not reviewed this encounter.   Raylene Everts, MD 02/07/20 2059    Raylene Everts, MD 02/07/20 2100

## 2020-08-28 ENCOUNTER — Other Ambulatory Visit: Payer: Self-pay

## 2020-08-28 ENCOUNTER — Encounter (HOSPITAL_COMMUNITY): Payer: Self-pay

## 2020-08-28 ENCOUNTER — Emergency Department (HOSPITAL_COMMUNITY)
Admission: EM | Admit: 2020-08-28 | Discharge: 2020-08-28 | Disposition: A | Discharge status: Home / Self Care | Payer: Self-pay | Attending: Emergency Medicine | Admitting: Emergency Medicine

## 2020-08-28 ENCOUNTER — Emergency Department (HOSPITAL_COMMUNITY): Payer: Self-pay

## 2020-08-28 DIAGNOSIS — R739 Hyperglycemia, unspecified: Secondary | ICD-10-CM

## 2020-08-28 DIAGNOSIS — Z79899 Other long term (current) drug therapy: Secondary | ICD-10-CM | POA: Insufficient documentation

## 2020-08-28 DIAGNOSIS — E1165 Type 2 diabetes mellitus with hyperglycemia: Secondary | ICD-10-CM | POA: Insufficient documentation

## 2020-08-28 DIAGNOSIS — I10 Essential (primary) hypertension: Secondary | ICD-10-CM | POA: Insufficient documentation

## 2020-08-28 DIAGNOSIS — Z7982 Long term (current) use of aspirin: Secondary | ICD-10-CM | POA: Insufficient documentation

## 2020-08-28 DIAGNOSIS — Z7984 Long term (current) use of oral hypoglycemic drugs: Secondary | ICD-10-CM | POA: Insufficient documentation

## 2020-08-28 HISTORY — DX: Sepsis, unspecified organism: A41.9

## 2020-08-28 HISTORY — DX: Type 2 diabetes mellitus without complications: E11.9

## 2020-08-28 LAB — CBC WITH DIFFERENTIAL/PLATELET
Abs Immature Granulocytes: 0.03 10*3/uL (ref 0.00–0.07)
Basophils Absolute: 0 10*3/uL (ref 0.0–0.1)
Basophils Relative: 1 %
Eosinophils Absolute: 0.2 10*3/uL (ref 0.0–0.5)
Eosinophils Relative: 2 %
HCT: 43 % (ref 36.0–46.0)
Hemoglobin: 14.1 g/dL (ref 12.0–15.0)
Immature Granulocytes: 0 %
Lymphocytes Relative: 27 %
Lymphs Abs: 2 10*3/uL (ref 0.7–4.0)
MCH: 30.3 pg (ref 26.0–34.0)
MCHC: 32.8 g/dL (ref 30.0–36.0)
MCV: 92.5 fL (ref 80.0–100.0)
Monocytes Absolute: 0.8 10*3/uL (ref 0.1–1.0)
Monocytes Relative: 10 %
Neutro Abs: 4.5 10*3/uL (ref 1.7–7.7)
Neutrophils Relative %: 60 %
Platelets: 275 10*3/uL (ref 150–400)
RBC: 4.65 MIL/uL (ref 3.87–5.11)
RDW: 12.1 % (ref 11.5–15.5)
WBC: 7.5 10*3/uL (ref 4.0–10.5)
nRBC: 0 % (ref 0.0–0.2)

## 2020-08-28 LAB — COMPREHENSIVE METABOLIC PANEL
ALT: 36 U/L (ref 0–44)
AST: 21 U/L (ref 15–41)
Albumin: 3.9 g/dL (ref 3.5–5.0)
Alkaline Phosphatase: 154 U/L — ABNORMAL HIGH (ref 38–126)
Anion gap: 12 (ref 5–15)
BUN: 20 mg/dL (ref 6–20)
CO2: 22 mmol/L (ref 22–32)
Calcium: 9.6 mg/dL (ref 8.9–10.3)
Chloride: 95 mmol/L — ABNORMAL LOW (ref 98–111)
Creatinine, Ser: 0.84 mg/dL (ref 0.44–1.00)
GFR, Estimated: >60 mL/min (ref >60)
Glucose, Bld: 491 mg/dL — ABNORMAL HIGH (ref 70–99)
Potassium: 4 mmol/L (ref 3.5–5.1)
Sodium: 129 mmol/L — ABNORMAL LOW (ref 135–145)
Total Bilirubin: 0.4 mg/dL (ref 0.3–1.2)
Total Protein: 7.1 g/dL (ref 6.5–8.1)

## 2020-08-28 LAB — URINALYSIS, ROUTINE W REFLEX MICROSCOPIC
Bacteria, UA: NONE SEEN
Bilirubin Urine: NEGATIVE
Glucose, UA: >=500 mg/dL — AB
Ketones, ur: NEGATIVE mg/dL
Leukocytes,Ua: NEGATIVE
Nitrite: NEGATIVE
Protein, ur: NEGATIVE mg/dL
Specific Gravity, Urine: 1.026 (ref 1.005–1.030)
pH: 5 (ref 5.0–8.0)

## 2020-08-28 LAB — CBG MONITORING, ED
Glucose-Capillary: 533 mg/dL (ref 70–99)
Glucose-Capillary: 395 mg/dL — ABNORMAL HIGH (ref 70–99)
Glucose-Capillary: 289 mg/dL — ABNORMAL HIGH (ref 70–99)

## 2020-08-28 MED ORDER — SODIUM CHLORIDE 0.9 % IV BOLUS
1000.0000 mL | Freq: Once | INTRAVENOUS | Status: AC
  Administered 2020-08-28: 1000 mL via INTRAVENOUS

## 2020-08-28 MED ORDER — INSULIN ASPART PROT & ASPART (70-30 MIX) 100 UNIT/ML ~~LOC~~ SUSP
5.0000 [IU] | Freq: Once | SUBCUTANEOUS | Status: AC
  Administered 2020-08-28: 5 [IU] via SUBCUTANEOUS
  Filled 2020-08-28: qty 10

## 2020-08-28 MED ORDER — ONDANSETRON HCL 4 MG/2ML IJ SOLN
4.0000 mg | Freq: Once | INTRAMUSCULAR | Status: AC
  Administered 2020-08-28: 4 mg via INTRAVENOUS
  Filled 2020-08-28: qty 2

## 2020-08-28 MED ORDER — SUCRALFATE 1 G PO TABS: 1.0000 g | ORAL_TABLET | Freq: Once | ORAL | Status: DC

## 2020-08-28 MED ORDER — LISINOPRIL 5 MG PO TABS: 5.0000 mg | ORAL_TABLET | Freq: Every day | ORAL | 1 refills | Status: DC

## 2020-08-28 MED ORDER — PANTOPRAZOLE SODIUM 40 MG IV SOLR: 40.0000 mg | Freq: Once | INTRAVENOUS | Status: DC

## 2020-08-28 MED ORDER — METFORMIN HCL 500 MG PO TABS: 500.0000 mg | ORAL_TABLET | Freq: Two times a day (BID) | ORAL | 1 refills | Status: DC

## 2020-08-28 MED ORDER — METOCLOPRAMIDE HCL 5 MG/ML IJ SOLN
10.0000 mg | Freq: Once | INTRAMUSCULAR | Status: AC
  Administered 2020-08-28: 10 mg via INTRAVENOUS
  Filled 2020-08-28: qty 2

## 2020-08-28 NOTE — Progress Notes (Signed)
.   Transition of Care [facility]) - Emergency Department Mini Assessment   Patient Details  Name: Karen Roberts MRN: [id number] Date of Birth: 11/09/1970  [facility] CM/SW Contact:    Erenest Rasher, RN Phone Number:  [phone] 08/28/2020, 6:33 PM   Clinical Narrative:  TOC CM spoke to pt and she is uses [facility] for her meds. Metformin is $4 at [employer]. And most blood pressure meds. Explained to call [facility] on Monday to schedule a follow up appt. Explained she can continue to follow up in clinic and apply for financial assistance. ED provider updated.   ED Mini Assessment: What brought you to the Emergency Department? : elevated blood sugar  Barriers to Discharge: No Barriers Identified  Barrier interventions: patient will use [facility] $4 for Rx     Interventions which prevented an admission or readmission: Medication Review,Follow-up medical appointment    Patient Contact and Communications        ,                 Admission diagnosis:  Hypertension with Hyperglycemia  Patient Active Problem List   Diagnosis Date Noted  . Bile leak, postoperative 12/13/2011  . 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 [place]. [place] [address] Phone: [phone] Fax: [phone]

## 2020-08-28 NOTE — ED Triage Notes (Signed)
Emergency Medicine Provider Triage Evaluation Note  Karen Roberts , a 50 y.o. female  was evaluated in triage.  Pt complains of elevated blood glucose at a health screening event today. History of HTN and DM, not taking meds for the past year dur to lack of insurance. Reports headaches at times, SHOB at times, polyuria, polydipsia. CBG 600 today.   Review of Systems  Positive: Leg swelling (chronic), increased thirst and urinary frequency Negative: Weakness, numbness, visual disturbance, CP  Physical Exam  BP (!) 171/107 (BP Location: Right Arm)   Pulse 95   Temp 97.8 F (36.6 C) (Oral)   Resp 16   Ht 5' 5.5" (1.664 m)   Wt 98.7 kg   LMP 11/29/2016 (Approximate)   SpO2 100%   BMI 35.67 kg/m  Gen:   Awake, no distress   HEENT:  Atraumatic  Resp:  Normal effort, lungs CTA Cardiac:  Normal rate  Abd:   Nondistended, nontender  MSK:   Moves extremities without difficulty  Neuro:  Speech clear   Medical Decision Making  Medically screening exam initiated at 4:03 PM.  Appropriate orders placed.  Karen Roberts was informed that the remainder of the evaluation will be completed by another provider, this initial triage assessment does not replace that evaluation, and the importance of remaining in the ED until their evaluation is complete.  Clinical Impression     Tacy Learn, PA-C 08/28/20 1605

## 2020-08-28 NOTE — ED Provider Notes (Signed)
[facility] DEPT Provider Note   CSN: [id number] Arrival date & time: 08/28/20  1541     History Chief Complaint  Patient presents with  . Hypertension  . Hyperglycemia  . Headache    Karen Roberts is a 50 y.o. female.  HPI   Patient with significant medical history of anxiety, diabetes type 2 headaches, hypertension presents to the emergency department with chief complaint of high blood pressure and high sugar.  Patient endorses she was at a church event where they were doing health screenings and they found her blood pressure and hyperglycemia.  She then came the emergency department for further evaluation.  She endorses over the last couple weeks she has had increased thirst, decreased in visual acuity, headaches, feeling weak, abdominal pain, numbness in her feet.  She endorses that she recently moved from [place] and has been without her medications for the last year and has not follow-up with a primary care provider.  She denies recent head trauma, is on anticoagulant.  She denies any alleviating factors.  Patient denies fevers, chills, sore throat, chest pain, shortness of breath, urinary symptoms, worsening pedal edema.  Past Medical History:  Diagnosis Date  . Allergy   . Anxiety   . Diabetes mellitus without complication ([place])   . Headache(784.0)    "every once in awhile"  . Hepatic steatosis 02/24/11  . Ligament tear    left; "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  . PONV (postoperative nausea and vomiting)   . Postoperative bile leak   . Sepsis [facility])     Patient Active Problem List   Diagnosis Date Noted  . Bile leak, postoperative 12/13/2011  . 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 APPENDECTOMY  2011  . CESAREAN SECTION  2000  . CHOLECYSTECTOMY  11/29/2011   Procedure: LAPAROSCOPIC CHOLECYSTECTOMY WITH INTRAOPERATIVE CHOLANGIOGRAM;  Surgeon: Imogene Burn. Georgette Dover, MD;  Location: [facility];   Service: General;  Laterality: N/A;  . ERCP  12/14/2011   Procedure: ENDOSCOPIC RETROGRADE CHOLANGIOPANCREATOGRAPHY (ERCP);  Surgeon: Gatha Mayer, MD;  Location: [facility];  Service: Endoscopy;  Laterality: N/A;  . ERCP  01/05/2012   Procedure: ENDOSCOPIC RETROGRADE CHOLANGIOPANCREATOGRAPHY (ERCP);  Surgeon: Gatha Mayer, MD;  Location: [facility];  Service: Gastroenterology;  Laterality: N/A;  . ESOPHAGOGASTRODUODENOSCOPY  02/07/2012   Procedure: ESOPHAGOGASTRODUODENOSCOPY (EGD);  Surgeon: Gatha Mayer, MD;  Location: [facility];  Service: Endoscopy;  Laterality: N/A;  endo with stent removal/ no dye needed/may need ercp scope/need xray  . RLQ abscess drain placement  12/12/11     OB History   No obstetric history on file.     Family History  Problem Relation Age of Onset  . Lung cancer Maternal Grandmother   . Brain cancer Maternal Grandmother   . Diabetes Mother   . Heart failure Mother   . Anesthesia problems Neg Hx   . Colon cancer Neg Hx     Social History   Tobacco Use  . Smoking status: Never Smoker  . Smokeless tobacco: Never Used  Vaping Use  . Vaping Use: Never used  Substance Use Topics  . Alcohol use: Yes    Comment: 12/12/11 "maybe once/ monthly [place] iced teas"  . Drug use: No    Home Medications Prior to Admission medications   Medication Sig Start Date End Date Taking? Authorizing Provider  aspirin EC 81 MG tablet Take 81 mg by mouth daily as needed for mild  pain. Swallow whole.   Yes [provider]  B Complex Vitamins (VITAMIN B COMPLEX PO) Take 1 tablet by mouth daily.   Yes [provider]  Carboxymethylcellul-Glycerin (CLEAR EYES FOR DRY EYES OP) Place 1 drop into both eyes daily as needed (dry eyes).   Yes [provider]  cholecalciferol (VITAMIN D3) 25 MCG (1000 UNIT) tablet Take 1,000 Units by mouth daily.   Yes [provider]  Echinacea 125 MG CAPS Take 125 mg by mouth daily.   Yes [provider]   lisinopril (ZESTRIL) 5 MG tablet Take 1 tablet (5 mg total) by mouth daily. 08/28/20 10/27/20 Yes Marcello Fennel, PA-C  Magnesium 100 MG CAPS Take 100 mg by mouth daily.   Yes [provider]  metFORMIN (GLUCOPHAGE) 500 MG tablet Take 1 tablet (500 mg total) by mouth 2 (two) times daily. 08/28/20 10/27/20 Yes Marcello Fennel, PA-C  zinc gluconate 50 MG tablet Take 50 mg by mouth daily.   Yes [provider]  EPINEPHrine (EPIPEN) 0.3 mg/0.3 mL DEVI Inject 0.3 mLs (0.3 mg total) into the muscle as needed. Patient not taking: Reported on 08/28/2020 07/16/12   Domenic Moras, PA-C  metFORMIN (GLUCOPHAGE) 500 MG tablet Take 1 tablet (500 mg total) by mouth 2 (two) times daily with a meal. Patient not taking: No sig reported 08/02/19   Larene Pickett, PA-C  phenazopyridine (PYRIDIUM) 95 MG tablet Take 1 tablet (95 mg total) by mouth 3 (three) times daily as needed for pain. Patient not taking: No sig reported 02/07/20   Raylene Everts, MD    Allergies    Ciprofloxacin, Morphine and related, Pork-derived products, and Morphine  Review of Systems   Review of Systems  Constitutional: Negative for chills and fever.  HENT: Negative for congestion.   Respiratory: Negative for shortness of breath.   Cardiovascular: Negative for chest pain.  Gastrointestinal: Positive for abdominal pain. Negative for nausea and vomiting.  Genitourinary: Positive for frequency. Negative for dysuria, enuresis and flank pain.  Musculoskeletal: Negative for back pain.  Skin: Negative for rash.  Neurological: Positive for weakness and headaches. Negative for dizziness.  Hematological: Does not bruise/bleed easily.    Physical Exam Updated Vital Signs BP 121/68   Pulse 92   Temp 98 F (36.7 C) (Oral)   Resp 18   Ht 5' 5.5" (1.664 m)   Wt 98.7 kg   LMP 11/29/2016 (Approximate)   SpO2 98%   BMI 35.67 kg/m   Physical Exam Vitals and nursing note reviewed.  Constitutional:      General:  She is not in acute distress.    Appearance: She is not ill-appearing.  HENT:     Head: Normocephalic and atraumatic.     Nose: No congestion.     Mouth/Throat:     Mouth: Mucous membranes are dry.     Pharynx: Oropharynx is clear. No oropharyngeal exudate or posterior oropharyngeal erythema.  Eyes:     Extraocular Movements: Extraocular movements intact.     Conjunctiva/sclera: Conjunctivae normal.     Pupils: Pupils are equal, round, and reactive to light.  Cardiovascular:     Rate and Rhythm: Normal rate and regular rhythm.     Pulses: Normal pulses.     Heart sounds: No murmur heard. No friction rub. No gallop.   Pulmonary:     Effort: No respiratory distress.     Breath sounds: No wheezing, rhonchi or rales.  Abdominal:     Palpations:  Abdomen is soft.     Tenderness: There is no abdominal tenderness.  Musculoskeletal:     Right lower leg: No edema.     Left lower leg: No edema.     Comments: Patient is moving all 4 extremities out difficulty.  Skin:    General: Skin is warm and dry.  Neurological:     General: No focal deficit present.     Mental Status: She is alert.  Psychiatric:        Mood and Affect: Mood normal.     ED Results / Procedures / Treatments   Labs (all labs ordered are listed, but only abnormal results are displayed) Labs Reviewed  COMPREHENSIVE METABOLIC PANEL - Abnormal; Notable for the following components:      Result Value   Sodium 129 (*)    Chloride 95 (*)    Glucose, Bld 491 (*)    Alkaline Phosphatase 154 (*)    All other components within normal limits  URINALYSIS, ROUTINE W REFLEX MICROSCOPIC - Abnormal; Notable for the following components:   Color, Urine STRAW (*)    Glucose, UA >=500 (*)    Hgb urine dipstick SMALL (*)    All other components within normal limits  CBG MONITORING, ED - Abnormal; Notable for the following components:   Glucose-Capillary 533 (*)    All other components within normal limits  CBG MONITORING, ED  - Abnormal; Notable for the following components:   Glucose-Capillary 395 (*)    All other components within normal limits  CBG MONITORING, ED - Abnormal; Notable for the following components:   Glucose-Capillary 289 (*)    All other components within normal limits  CBC WITH DIFFERENTIAL/PLATELET    EKG None  Radiology DG Chest 2 View  Result Date: 08/28/2020 CLINICAL DATA:  Hypertension.  Diabetes EXAM: CHEST - 2 VIEW COMPARISON:  01/14/2017 FINDINGS: The heart size and mediastinal contours are within normal limits. Both lungs are clear. The visualized skeletal structures are unremarkable. IMPRESSION: No active cardiopulmonary disease. Electronically Signed   By: Franchot Gallo M.D.   On: 08/28/2020 17:08    Procedures Procedures   Medications Ordered in ED Medications  sodium chloride 0.9 % bolus 1,000 mL (0 mLs Intravenous Stopped 08/28/20 2041)  metoCLOPramide (REGLAN) injection 10 mg (10 mg Intravenous Given 08/28/20 1842)  ondansetron (ZOFRAN) injection 4 mg (4 mg Intravenous Given 08/28/20 1841)  insulin aspart protamine- aspart (NOVOLOG MIX 70/30) injection 5 Units (5 Units Subcutaneous Given 08/28/20 1935)    ED Course  I have reviewed the triage vital signs and the nursing notes.  Pertinent labs & imaging results that were available during my care of the patient were reviewed by me and considered in my medical decision making (see chart for details).    MDM Rules/Calculators/A&P                         Patient presents with chief complaint of high blood pressure and hyperglycemia.  She is alert, does not appear acute distress, vital signs reassuring.  Triage obtained basic lab work, will start patient on fluids, insulin, provide her with migraine medication and antiemetics and reevaluate.  will also place consult with social work to help her obtain proper medications and good follow-up.  Patient is reevaluated, states she is feeling much better, headache has resolved, has  no complaints at this time.  Recent CBG is 289, vital signs remained stable.  Spoke with [employer] RN of  case management who was able to set patient up with outpatient follow-up, and arrange for payment for her medications.  CBC negative for leukocytosis or signs anemia.  CMP shows hyponatremia of 129, hyperglycemia 491, no AKI, alk phos of 154, no anion gap present.  UA negative for nitrates, leukocytes, no proteins or ketones present.  Chest x-ray does not reveal any acute findings.  Low suspicion for HHS or DKA as there is no anion gap noted on CMP, no ketones or proteins noted in urine, no electrolyte derailments.  Low suspicion for systemic infection as patient is nontoxic-appearing, vital signs reassuring, no observed infection on my exam.  Low suspicion for hypertensive urgency or emergency as there is no signs of organ damage seen on physical exam or lab work.  Low suspicion for CVA or intracranial head bleed as patient denies recent head trauma, no neuro deficits on my exam.  I suspect patient's hyperglycemia is secondary to noncompliance as she was unable to obtain her medications.  Will restart patient on Metformin for glucose control and lisinopril as she was on this before and its renal protective for people with diabetes.  Will encourage she follows up with PCP for further evaluation.  Vital signs have remained stable, no indication for hospital admission.  Patient discussed with attending and they agreed with assessment and plan.  Patient given at home care as well strict return precautions.  Patient verbalized that they understood agreed to said plan.    Final Clinical Impression(s) / ED Diagnoses Final diagnoses:  Hypertension, unspecified type  Hyperglycemia    Rx / DC Orders ED Discharge Orders         Ordered    lisinopril (ZESTRIL) 5 MG tablet  Daily        08/28/20 2115    metFORMIN (GLUCOPHAGE) 500 MG tablet  2 times daily        08/28/20 2115           Aron Baba 08/28/20 2138    Carmin Muskrat, MD 08/29/20 1743

## 2020-08-28 NOTE — ED Triage Notes (Signed)
Patient reports that she has not taken her Metformin or Insulin in almost a year due to lack of insurance or money. Patient went to a church health screening and was told her blood sugar was >600. Patient also c/o headache and nausea.  CBG in triage-533.

## 2020-08-28 NOTE — Discharge Instructions (Addendum)
You have been seen for high blood pressure and hyperglycemia.  Your lab work looks reassuring.  I have started you on your diabetes medication please take as prescribed.  I will have also started you on your hypertension medications please take as prescribed.  Important you follow-up with your primary care provider, please contact community health and wellness they will help you find a primary care provider.  Come back to the emergency department if you develop chest pain, shortness of breath, severe abdominal pain, uncontrolled nausea, vomiting, diarrhea.

## 2020-11-10 ENCOUNTER — Encounter (HOSPITAL_COMMUNITY): Payer: Self-pay

## 2020-11-10 ENCOUNTER — Emergency Department (HOSPITAL_COMMUNITY): Admission: EM | Admit: 2020-11-10 | Discharge: 2020-11-10 | Discharge status: Against Medical Advice | Payer: Self-pay

## 2020-11-10 ENCOUNTER — Other Ambulatory Visit: Payer: Self-pay

## 2020-11-10 DIAGNOSIS — Z833 Family history of diabetes mellitus: Secondary | ICD-10-CM

## 2020-11-10 DIAGNOSIS — E669 Obesity, unspecified: Secondary | ICD-10-CM | POA: Diagnosis present

## 2020-11-10 DIAGNOSIS — I10 Essential (primary) hypertension: Secondary | ICD-10-CM | POA: Diagnosis present

## 2020-11-10 DIAGNOSIS — L0201 Cutaneous abscess of face: Secondary | ICD-10-CM | POA: Diagnosis present

## 2020-11-10 DIAGNOSIS — K760 Fatty (change of) liver, not elsewhere classified: Secondary | ICD-10-CM | POA: Diagnosis present

## 2020-11-10 DIAGNOSIS — K047 Periapical abscess without sinus: Secondary | ICD-10-CM | POA: Diagnosis present

## 2020-11-10 DIAGNOSIS — L03211 Cellulitis of face: Principal | ICD-10-CM | POA: Diagnosis present

## 2020-11-10 DIAGNOSIS — Z7982 Long term (current) use of aspirin: Secondary | ICD-10-CM

## 2020-11-10 DIAGNOSIS — Z6833 Body mass index (BMI) 33.0-33.9, adult: Secondary | ICD-10-CM

## 2020-11-10 DIAGNOSIS — K029 Dental caries, unspecified: Secondary | ICD-10-CM | POA: Diagnosis present

## 2020-11-10 DIAGNOSIS — Z20822 Contact with and (suspected) exposure to covid-19: Secondary | ICD-10-CM | POA: Diagnosis present

## 2020-11-10 DIAGNOSIS — L03221 Cellulitis of neck: Secondary | ICD-10-CM | POA: Diagnosis present

## 2020-11-10 DIAGNOSIS — F419 Anxiety disorder, unspecified: Secondary | ICD-10-CM | POA: Diagnosis present

## 2020-11-10 DIAGNOSIS — E876 Hypokalemia: Secondary | ICD-10-CM | POA: Diagnosis present

## 2020-11-10 DIAGNOSIS — E1165 Type 2 diabetes mellitus with hyperglycemia: Secondary | ICD-10-CM | POA: Diagnosis present

## 2020-11-10 DIAGNOSIS — Z79899 Other long term (current) drug therapy: Secondary | ICD-10-CM

## 2020-11-10 DIAGNOSIS — Z7984 Long term (current) use of oral hypoglycemic drugs: Secondary | ICD-10-CM

## 2020-11-10 LAB — BASIC METABOLIC PANEL
Anion gap: 8 (ref 5–15)
BUN: 15 mg/dL (ref 6–20)
CO2: 26 mmol/L (ref 22–32)
Calcium: 9.4 mg/dL (ref 8.9–10.3)
Chloride: 103 mmol/L (ref 98–111)
Creatinine, Ser: 0.9 mg/dL (ref 0.44–1.00)
GFR, Estimated: >60 mL/min (ref >60)
Glucose, Bld: 275 mg/dL — ABNORMAL HIGH (ref 70–99)
Potassium: 3.4 mmol/L — ABNORMAL LOW (ref 3.5–5.1)
Sodium: 137 mmol/L (ref 135–145)

## 2020-11-10 LAB — CBC WITH DIFFERENTIAL/PLATELET
Abs Immature Granulocytes: 0.02 10*3/uL (ref 0.00–0.07)
Basophils Absolute: 0 10*3/uL (ref 0.0–0.1)
Basophils Relative: 1 %
Eosinophils Absolute: 0.2 10*3/uL (ref 0.0–0.5)
Eosinophils Relative: 2 %
HCT: 40 % (ref 36.0–46.0)
Hemoglobin: 13.7 g/dL (ref 12.0–15.0)
Immature Granulocytes: 0 %
Lymphocytes Relative: 24 %
Lymphs Abs: 2 10*3/uL (ref 0.7–4.0)
MCH: 30 pg (ref 26.0–34.0)
MCHC: 34.3 g/dL (ref 30.0–36.0)
MCV: 87.7 fL (ref 80.0–100.0)
Monocytes Absolute: 1 10*3/uL (ref 0.1–1.0)
Monocytes Relative: 13 %
Neutro Abs: 5 10*3/uL (ref 1.7–7.7)
Neutrophils Relative %: 60 %
Platelets: 294 10*3/uL (ref 150–400)
RBC: 4.56 MIL/uL (ref 3.87–5.11)
RDW: 12.4 % (ref 11.5–15.5)
WBC: 8.2 10*3/uL (ref 4.0–10.5)
nRBC: 0 % (ref 0.0–0.2)

## 2020-11-10 MED ORDER — AMOXICILLIN 500 MG PO CAPS
500.0000 mg | ORAL_CAPSULE | Freq: Once | ORAL | Status: AC
  Administered 2020-11-11: 500 mg via ORAL
  Filled 2020-11-10: qty 1

## 2020-11-10 MED ORDER — KETOROLAC TROMETHAMINE 30 MG/ML IJ SOLN
30.0000 mg | Freq: Once | INTRAMUSCULAR | Status: AC
  Administered 2020-11-11: 30 mg via INTRAVENOUS
  Filled 2020-11-10: qty 1

## 2020-11-10 NOTE — ED Provider Notes (Signed)
MSE was initiated and I personally evaluated the patient and placed orders (if any) at  10:27 PM on November 10, 2020.  The patient appears stable so that the remainder of the MSE may be completed by another provider.  The patient complains of several days of left lower jaw swelling secondary to a broken tooth.  Subjective fever.  No other symptoms.  On exam, she looks uncomfortable and has swelling at the left mandible.  She has a broken first molar with some associated gingival swelling.   Arnaldo Natal, MD 11/10/20 2227

## 2020-11-10 NOTE — ED Notes (Signed)
Patient was called for triage but no response. x1

## 2020-11-10 NOTE — ED Triage Notes (Signed)
Pt c/o abscess on the inside of her mouth, left side. Swelling noted to left side of face, starting 3 days ago.

## 2020-11-10 NOTE — ED Notes (Signed)
Patient was called for triage but no response. x2

## 2020-11-11 ENCOUNTER — Inpatient Hospital Stay (HOSPITAL_COMMUNITY)
Admission: EM | Admit: 2020-11-11 | Discharge: 2020-11-13 | DRG: 580 | Disposition: A | Discharge status: Home / Self Care | Payer: Self-pay | Attending: Student | Admitting: Student

## 2020-11-11 ENCOUNTER — Encounter (HOSPITAL_COMMUNITY): Payer: Self-pay

## 2020-11-11 ENCOUNTER — Emergency Department (HOSPITAL_COMMUNITY): Payer: Self-pay

## 2020-11-11 DIAGNOSIS — K029 Dental caries, unspecified: Secondary | ICD-10-CM

## 2020-11-11 DIAGNOSIS — L03211 Cellulitis of face: Principal | ICD-10-CM

## 2020-11-11 DIAGNOSIS — I10 Essential (primary) hypertension: Secondary | ICD-10-CM

## 2020-11-11 DIAGNOSIS — R748 Abnormal levels of other serum enzymes: Secondary | ICD-10-CM

## 2020-11-11 DIAGNOSIS — Z6833 Body mass index (BMI) 33.0-33.9, adult: Secondary | ICD-10-CM

## 2020-11-11 DIAGNOSIS — L0390 Cellulitis, unspecified: Secondary | ICD-10-CM | POA: Diagnosis present

## 2020-11-11 DIAGNOSIS — E876 Hypokalemia: Secondary | ICD-10-CM

## 2020-11-11 DIAGNOSIS — E6609 Other obesity due to excess calories: Secondary | ICD-10-CM

## 2020-11-11 DIAGNOSIS — E1165 Type 2 diabetes mellitus with hyperglycemia: Secondary | ICD-10-CM

## 2020-11-11 LAB — CBC
HCT: 43.7 % (ref 36.0–46.0)
Hemoglobin: 14.5 g/dL (ref 12.0–15.0)
MCH: 30.5 pg (ref 26.0–34.0)
MCHC: 33.2 g/dL (ref 30.0–36.0)
MCV: 92 fL (ref 80.0–100.0)
Platelets: 254 10*3/uL (ref 150–400)
RBC: 4.75 MIL/uL (ref 3.87–5.11)
RDW: 12.3 % (ref 11.5–15.5)
WBC: 7.5 10*3/uL (ref 4.0–10.5)
nRBC: 0 % (ref 0.0–0.2)

## 2020-11-11 LAB — COMPREHENSIVE METABOLIC PANEL
ALT: 81 U/L — ABNORMAL HIGH (ref 0–44)
AST: 138 U/L — ABNORMAL HIGH (ref 15–41)
Albumin: 3.8 g/dL (ref 3.5–5.0)
Alkaline Phosphatase: 155 U/L — ABNORMAL HIGH (ref 38–126)
Anion gap: 10 (ref 5–15)
BUN: 14 mg/dL (ref 6–20)
CO2: 28 mmol/L (ref 22–32)
Calcium: 9.3 mg/dL (ref 8.9–10.3)
Chloride: 99 mmol/L (ref 98–111)
Creatinine, Ser: 0.81 mg/dL (ref 0.44–1.00)
GFR, Estimated: >60 mL/min (ref >60)
Glucose, Bld: 313 mg/dL — ABNORMAL HIGH (ref 70–99)
Potassium: 3.4 mmol/L — ABNORMAL LOW (ref 3.5–5.1)
Sodium: 137 mmol/L (ref 135–145)
Total Bilirubin: 0.5 mg/dL (ref 0.3–1.2)
Total Protein: 7.3 g/dL (ref 6.5–8.1)

## 2020-11-11 LAB — GLUCOSE, CAPILLARY
Glucose-Capillary: 263 mg/dL — ABNORMAL HIGH (ref 70–99)
Glucose-Capillary: 227 mg/dL — ABNORMAL HIGH (ref 70–99)
Glucose-Capillary: 191 mg/dL — ABNORMAL HIGH (ref 70–99)
Glucose-Capillary: 164 mg/dL — ABNORMAL HIGH (ref 70–99)

## 2020-11-11 LAB — RESP PANEL BY RT-PCR (FLU A&B, COVID) ARPGX2
Influenza A by PCR: NEGATIVE
Influenza B by PCR: NEGATIVE
SARS Coronavirus 2 by RT PCR: NEGATIVE

## 2020-11-11 LAB — HEMOGLOBIN A1C
Hgb A1c MFr Bld: 12.4 % — ABNORMAL HIGH (ref 4.8–5.6)
Mean Plasma Glucose: 309.18 mg/dL

## 2020-11-11 LAB — HIV ANTIBODY (ROUTINE TESTING W REFLEX): HIV Screen 4th Generation wRfx: NONREACTIVE

## 2020-11-11 LAB — CK: Total CK: 83 U/L (ref 38–234)

## 2020-11-11 LAB — MAGNESIUM: Magnesium: 1.9 mg/dL (ref 1.7–2.4)

## 2020-11-11 MED ORDER — HYDROMORPHONE HCL 1 MG/ML IJ SOLN
1.0000 mg | Freq: Once | INTRAMUSCULAR | Status: AC
  Administered 2020-11-11: 1 mg via INTRAVENOUS
  Filled 2020-11-11: qty 1

## 2020-11-11 MED ORDER — ZINC GLUCONATE 50 MG PO TABS: 50.0000 mg | ORAL_TABLET | Freq: Every day | ORAL | Status: DC

## 2020-11-11 MED ORDER — POLYVINYL ALCOHOL 1.4 % OP SOLN
Freq: Every day | OPHTHALMIC | Status: DC | PRN
  Filled 2020-11-11: qty 15

## 2020-11-11 MED ORDER — ACETAMINOPHEN 325 MG PO TABS
650.0000 mg | ORAL_TABLET | Freq: Four times a day (QID) | ORAL | Status: DC | PRN
  Administered 2020-11-12: 650 mg via ORAL
  Filled 2020-11-11: qty 2

## 2020-11-11 MED ORDER — POTASSIUM CHLORIDE CRYS ER 20 MEQ PO TBCR
40.0000 meq | EXTENDED_RELEASE_TABLET | ORAL | Status: AC
  Administered 2020-11-11 (×2): 40 meq via ORAL
  Filled 2020-11-11 (×2): qty 2

## 2020-11-11 MED ORDER — TRAZODONE HCL 50 MG PO TABS: 25.0000 mg | ORAL_TABLET | Freq: Every evening | ORAL | Status: DC | PRN

## 2020-11-11 MED ORDER — INSULIN ASPART 100 UNIT/ML ~~LOC~~ SOLN
0.0000 [IU] | Freq: Three times a day (TID) | SUBCUTANEOUS | Status: DC
  Administered 2020-11-11: 8 [IU] via SUBCUTANEOUS
  Administered 2020-11-11: 5 [IU] via SUBCUTANEOUS

## 2020-11-11 MED ORDER — ONDANSETRON HCL 4 MG PO TABS: 4.0000 mg | ORAL_TABLET | Freq: Four times a day (QID) | ORAL | Status: DC | PRN

## 2020-11-11 MED ORDER — RISAQUAD PO CAPS
2.0000 | ORAL_CAPSULE | Freq: Every day | ORAL | Status: DC
  Administered 2020-11-13: 2 via ORAL
  Filled 2020-11-11 (×2): qty 2

## 2020-11-11 MED ORDER — INSULIN ASPART 100 UNIT/ML ~~LOC~~ SOLN: 0.0000 [IU] | Freq: Every day | SUBCUTANEOUS | Status: DC

## 2020-11-11 MED ORDER — ACETAMINOPHEN 650 MG RE SUPP: 650.0000 mg | Freq: Four times a day (QID) | RECTAL | Status: DC | PRN

## 2020-11-11 MED ORDER — SODIUM CHLORIDE 0.9 % IV SOLN: INTRAVENOUS | Status: DC

## 2020-11-11 MED ORDER — CLINDAMYCIN PHOSPHATE 600 MG/50ML IV SOLN
600.0000 mg | Freq: Once | INTRAVENOUS | Status: AC
  Administered 2020-11-11: 600 mg via INTRAVENOUS
  Filled 2020-11-11: qty 50

## 2020-11-11 MED ORDER — KETOROLAC TROMETHAMINE 30 MG/ML IJ SOLN
30.0000 mg | Freq: Four times a day (QID) | INTRAMUSCULAR | Status: DC | PRN
  Filled 2020-11-11 (×2): qty 1

## 2020-11-11 MED ORDER — LISINOPRIL 5 MG PO TABS
5.0000 mg | ORAL_TABLET | Freq: Every day | ORAL | Status: DC
  Administered 2020-11-13: 5 mg via ORAL
  Filled 2020-11-11 (×2): qty 1

## 2020-11-11 MED ORDER — SENNOSIDES-DOCUSATE SODIUM 8.6-50 MG PO TABS: 1.0000 | ORAL_TABLET | Freq: Every evening | ORAL | Status: DC | PRN

## 2020-11-11 MED ORDER — INSULIN GLARGINE 100 UNIT/ML ~~LOC~~ SOLN
6.0000 [IU] | Freq: Two times a day (BID) | SUBCUTANEOUS | Status: DC
  Administered 2020-11-11 – 2020-11-13 (×3): 6 [IU] via SUBCUTANEOUS
  Filled 2020-11-11 (×4): qty 0.06

## 2020-11-11 MED ORDER — INSULIN ASPART 100 UNIT/ML ~~LOC~~ SOLN
0.0000 [IU] | Freq: Three times a day (TID) | SUBCUTANEOUS | Status: DC
  Administered 2020-11-11 – 2020-11-12 (×2): 3 [IU] via SUBCUTANEOUS
  Administered 2020-11-13: 5 [IU] via SUBCUTANEOUS
  Administered 2020-11-13: 8 [IU] via SUBCUTANEOUS

## 2020-11-11 MED ORDER — HYDROMORPHONE HCL 1 MG/ML IJ SOLN
0.5000 mg | INTRAMUSCULAR | Status: DC | PRN
  Administered 2020-11-11 – 2020-11-13 (×9): 0.5 mg via INTRAVENOUS
  Filled 2020-11-11 (×9): qty 0.5

## 2020-11-11 MED ORDER — MAGNESIUM 100 MG PO CAPS: 100.0000 mg | ORAL_CAPSULE | Freq: Every day | ORAL | Status: DC

## 2020-11-11 MED ORDER — IOHEXOL 300 MG/ML  SOLN
75.0000 mL | Freq: Once | INTRAMUSCULAR | Status: AC | PRN
  Administered 2020-11-11: 75 mL via INTRAVENOUS

## 2020-11-11 MED ORDER — MAGNESIUM CITRATE PO SOLN: 1.0000 | Freq: Once | ORAL | Status: DC | PRN

## 2020-11-11 MED ORDER — SODIUM CHLORIDE 0.9 % IV SOLN
3.0000 g | Freq: Four times a day (QID) | INTRAVENOUS | Status: DC
  Administered 2020-11-11 – 2020-11-13 (×9): 3 g via INTRAVENOUS
  Filled 2020-11-11: qty 8
  Filled 2020-11-11 (×6): qty 3
  Filled 2020-11-11: qty 8
  Filled 2020-11-11 (×3): qty 3

## 2020-11-11 MED ORDER — VITAMIN D 25 MCG (1000 UNIT) PO TABS
1000.0000 [IU] | ORAL_TABLET | Freq: Every day | ORAL | Status: DC
  Administered 2020-11-11 – 2020-11-13 (×2): 1000 [IU] via ORAL
  Filled 2020-11-11 (×2): qty 1

## 2020-11-11 MED ORDER — ONDANSETRON HCL 4 MG/2ML IJ SOLN
4.0000 mg | Freq: Four times a day (QID) | INTRAMUSCULAR | Status: DC | PRN
  Administered 2020-11-11: 4 mg via INTRAVENOUS
  Filled 2020-11-11: qty 2

## 2020-11-11 MED ORDER — INSULIN ASPART 100 UNIT/ML ~~LOC~~ SOLN
4.0000 [IU] | Freq: Three times a day (TID) | SUBCUTANEOUS | Status: DC
  Administered 2020-11-11 – 2020-11-13 (×3): 4 [IU] via SUBCUTANEOUS

## 2020-11-11 MED ORDER — BISACODYL 5 MG PO TBEC: 5.0000 mg | DELAYED_RELEASE_TABLET | Freq: Every day | ORAL | Status: DC | PRN

## 2020-11-11 MED ORDER — INSULIN ASPART 100 UNIT/ML ~~LOC~~ SOLN
0.0000 [IU] | Freq: Every day | SUBCUTANEOUS | Status: DC
  Administered 2020-11-12: 11 [IU] via SUBCUTANEOUS

## 2020-11-11 NOTE — Consult Note (Signed)
Reason for Consult:Facial abscess Referring Physician: Hugelmeyer, Alexis, DO  Karen Roberts is an 50 y.o. female who developed swelling and pain of her left jaw 1-2 days ago. Presented to ER this am for evaluation and was admitted.     Past Medical History:  Diagnosis Date  . Allergy   . Anxiety   . Diabetes mellitus without complication ([place])   . Headache(784.0)    "every once in awhile"  . Hepatic steatosis 02/24/11  . Ligament tear    left; "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  . PONV (postoperative nausea and vomiting)   . Postoperative bile leak   . Sepsis [facility])     Past Surgical History:  Procedure Laterality Date  . APPENDECTOMY  2011  . CESAREAN SECTION  2000  . CHOLECYSTECTOMY  11/29/2011   Procedure: LAPAROSCOPIC CHOLECYSTECTOMY WITH INTRAOPERATIVE CHOLANGIOGRAM;  Surgeon: Imogene Burn. Georgette Dover, MD;  Location: [facility];  Service: General;  Laterality: N/A;  . ERCP  12/14/2011   Procedure: ENDOSCOPIC RETROGRADE CHOLANGIOPANCREATOGRAPHY (ERCP);  Surgeon: Gatha Mayer, MD;  Location: [facility];  Service: Endoscopy;  Laterality: N/A;  . ERCP  01/05/2012   Procedure: ENDOSCOPIC RETROGRADE CHOLANGIOPANCREATOGRAPHY (ERCP);  Surgeon: Gatha Mayer, MD;  Location: [facility];  Service: Gastroenterology;  Laterality: N/A;  . ESOPHAGOGASTRODUODENOSCOPY  02/07/2012   Procedure: ESOPHAGOGASTRODUODENOSCOPY (EGD);  Surgeon: Gatha Mayer, MD;  Location: [facility];  Service: Endoscopy;  Laterality: N/A;  endo with stent removal/ no dye needed/may need ercp scope/need xray  . RLQ abscess drain placement  12/12/11    Family History  Problem Relation Age of Onset  . Lung cancer Maternal Grandmother   . Brain cancer Maternal Grandmother   . Diabetes Mother   . Heart failure Mother   . Anesthesia problems Neg Hx   . Colon cancer Neg Hx     Social History:  reports that she has never smoked. She has never used smokeless tobacco. She reports current alcohol use. She reports that she does not use  drugs.  Allergies:  Allergies  Allergen Reactions  . Ciprofloxacin Dermatitis    "dr's think it may have caused mastitis"  . Morphine And Related Nausea Only    Nausea and headache  . Pork-Derived Products Shortness Of Breath and Nausea And Vomiting  . Morphine Other (See Comments)    Headaches, itching, anger    Medications: I have reviewed the patient's current medications.  Results for orders placed or performed during the hospital encounter of 11/11/20 (from the past 48 hour(s))  CBC with Differential     Status: None   Collection Time: 11/10/20 10:36 PM  Result Value Ref Range   WBC 8.2 4.0 - 10.5 K/uL   RBC 4.56 3.87 - 5.11 MIL/uL   Hemoglobin 13.7 12.0 - 15.0 g/dL   HCT 40.0 36.0 - 46.0 %   MCV 87.7 80.0 - 100.0 fL   MCH 30.0 26.0 - 34.0 pg   MCHC 34.3 30.0 - 36.0 g/dL   RDW 12.4 11.5 - 15.5 %   Platelets 294 150 - 400 K/uL   nRBC 0.0 0.0 - 0.2 %   Neutrophils Relative % 60 %   Neutro Abs 5.0 1.7 - 7.7 K/uL   Lymphocytes Relative 24 %   Lymphs Abs 2.0 0.7 - 4.0 K/uL   Monocytes Relative 13 %   Monocytes Absolute 1.0 0.1 - 1.0 K/uL   Eosinophils Relative 2 %   Eosinophils Absolute 0.2 0.0 - 0.5 K/uL   Basophils Relative 1 %  Basophils Absolute 0.0 0.0 - 0.1 K/uL   Immature Granulocytes 0 %   Abs Immature Granulocytes 0.02 0.00 - 0.07 K/uL    Comment: Performed at [facility], [place] [address], [place], [place] [address]  Basic metabolic panel     Status: Abnormal   Collection Time: 11/10/20 10:36 PM  Result Value Ref Range   Sodium 137 135 - 145 mmol/L   Potassium 3.4 (L) 3.5 - 5.1 mmol/L   Chloride 103 98 - 111 mmol/L   CO2 26 22 - 32 mmol/L   Glucose, Bld 275 (H) 70 - 99 mg/dL    Comment: Glucose reference range applies only to samples taken after fasting for at least 8 hours.   BUN 15 6 - 20 mg/dL   Creatinine, Ser 0.90 0.44 - 1.00 mg/dL   Calcium 9.4 8.9 - 10.3 mg/dL   GFR, Estimated >60 >60 mL/min    Comment: (NOTE) Calculated  using the CKD-EPI Creatinine Equation (2021)    Anion gap 8 5 - 15    Comment: Performed at [facility], [place] [address], [place], [place] [address]  Resp Panel by RT-PCR (Flu A&B, Covid) Nasopharyngeal Swab     Status: None   Collection Time: 11/11/20  2:45 AM   Specimen: Nasopharyngeal Swab; Nasopharyngeal(NP) swabs in vial transport medium  Result Value Ref Range   SARS Coronavirus 2 by RT PCR NEGATIVE NEGATIVE    Comment: (NOTE) SARS-CoV-2 target nucleic acids are NOT DETECTED.  The SARS-CoV-2 RNA is generally detectable in upper respiratory specimens during the acute phase of infection. The lowest concentration of SARS-CoV-2 viral copies this assay can detect is 138 copies/mL. A negative result does not preclude SARS-Cov-2 infection and should not be used as the sole basis for treatment or other patient management decisions. A negative result may occur with  improper specimen collection/handling, submission of specimen other than nasopharyngeal swab, presence of viral mutation(s) within the areas targeted by this assay, and inadequate number of viral copies(<138 copies/mL). A negative result must be combined with clinical observations, patient history, and epidemiological information. The expected result is Negative.  Fact Sheet for Patients:  EntrepreneurPulse.com.au  Fact Sheet for Healthcare Providers:  IncredibleEmployment.be  This test is no t yet approved or cleared by the [place] FDA and  has been authorized for detection and/or diagnosis of SARS-CoV-2 by FDA under an Emergency Use Authorization (EUA). This EUA will remain  in effect (meaning this test can be used) for the duration of the COVID-19 declaration under Section 564(b)(1) of the Act, 21 U.S.C.section 360bbb-3(b)(1), unless the authorization is terminated  or revoked sooner.       Influenza A by PCR NEGATIVE NEGATIVE   Influenza B by PCR  NEGATIVE NEGATIVE    Comment: (NOTE) The Xpert Xpress SARS-CoV-2/FLU/RSV plus assay is intended as an aid in the diagnosis of influenza from Nasopharyngeal swab specimens and should not be used as a sole basis for treatment. Nasal washings and aspirates are unacceptable for Xpert Xpress SARS-CoV-2/FLU/RSV testing.  Fact Sheet for Patients: EntrepreneurPulse.com.au  Fact Sheet for Healthcare Providers: IncredibleEmployment.be  This test is not yet approved or cleared by the [place] FDA and has been authorized for detection and/or diagnosis of SARS-CoV-2 by FDA under an Emergency Use Authorization (EUA). This EUA will remain in effect (meaning this test can be used) for the duration of the COVID-19 declaration under Section 564(b)(1) of the Act, 21 U.S.C. section 360bbb-3(b)(1), unless the authorization is terminated  or revoked.  Performed at [facility], [place] [address] [place], [place] [address]   HIV Antibody (routine testing w rflx)     Status: None   Collection Time: 11/11/20  4:26 AM  Result Value Ref Range   HIV Screen 4th Generation wRfx Non Reactive Non Reactive    Comment: Performed at [facility], [place] [address], [place], [place] [address]  Comprehensive metabolic panel     Status: Abnormal   Collection Time: 11/11/20  4:26 AM  Result Value Ref Range   Sodium 137 135 - 145 mmol/L   Potassium 3.4 (L) 3.5 - 5.1 mmol/L   Chloride 99 98 - 111 mmol/L   CO2 28 22 - 32 mmol/L   Glucose, Bld 313 (H) 70 - 99 mg/dL    Comment: Glucose reference range applies only to samples taken after fasting for at least 8 hours.   BUN 14 6 - 20 mg/dL   Creatinine, Ser 0.81 0.44 - 1.00 mg/dL   Calcium 9.3 8.9 - 10.3 mg/dL   Total Protein 7.3 6.5 - 8.1 g/dL   Albumin 3.8 3.5 - 5.0 g/dL   AST 138 (H) 15 - 41 U/L   ALT 81 (H) 0 - 44 U/L   Alkaline Phosphatase 155 (H) 38 - 126 U/L   Total Bilirubin 0.5 0.3 - 1.2 mg/dL    GFR, Estimated >60 >60 mL/min    Comment: (NOTE) Calculated using the CKD-EPI Creatinine Equation (2021)    Anion gap 10 5 - 15    Comment: Performed at [facility], [place] [address], [place], [place] [address]  CBC     Status: None   Collection Time: 11/11/20  4:26 AM  Result Value Ref Range   WBC 7.5 4.0 - 10.5 K/uL   RBC 4.75 3.87 - 5.11 MIL/uL   Hemoglobin 14.5 12.0 - 15.0 g/dL   HCT 43.7 36.0 - 46.0 %   MCV 92.0 80.0 - 100.0 fL   MCH 30.5 26.0 - 34.0 pg   MCHC 33.2 30.0 - 36.0 g/dL   RDW 12.3 11.5 - 15.5 %   Platelets 254 150 - 400 K/uL   nRBC 0.0 0.0 - 0.2 %    Comment: Performed at [facility], [place] [address] [place], [place] [address]  Hemoglobin A1c     Status: Abnormal   Collection Time: 11/11/20  4:26 AM  Result Value Ref Range   Hgb A1c MFr Bld 12.4 (H) 4.8 - 5.6 %    Comment: (NOTE) Pre diabetes:          5.7%-6.4%  Diabetes:              >6.4%  Glycemic control for   <7.0% adults with diabetes    Mean Plasma Glucose 309.18 mg/dL    Comment: Performed at [place] [address], [place], [place] [address]  CK     Status: None   Collection Time: 11/11/20  4:26 AM  Result Value Ref Range   Total CK 83 38 - 234 U/L    Comment: Performed at [facility], [place] [address], [place], [place] [address]  Magnesium     Status: None   Collection Time: 11/11/20  4:26 AM  Result Value Ref Range   Magnesium 1.9 1.7 - 2.4 mg/dL    Comment: Performed at [facility], [place] [address], [place], [place] [address]  Glucose, capillary  Status: Abnormal   Collection Time: 11/11/20  7:46 AM  Result Value Ref Range   Glucose-Capillary 263 (H) 70 - 99 mg/dL    Comment: Glucose reference range applies only to samples taken after fasting for at least 8 hours.  Glucose, capillary     Status: Abnormal   Collection Time: 11/11/20 11:50 AM  Result Value Ref Range   Glucose-Capillary 227 (H) 70 -  99 mg/dL    Comment: Glucose reference range applies only to samples taken after fasting for at least 8 hours.    CT Maxillofacial W Contrast  Result Date: 11/11/2020 CLINICAL DATA:  Initial evaluation for acute left-sided facial swelling. EXAM: CT MAXILLOFACIAL WITH CONTRAST TECHNIQUE: Multidetector CT imaging of the maxillofacial structures was performed with intravenous contrast. Multiplanar CT image reconstructions were also generated. CONTRAST:  79mL OMNIPAQUE IOHEXOL 300 MG/ML  SOLN COMPARISON:  None. FINDINGS: Osseous: No acute osseous abnormality seen about the face. 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 Multilevel degenerative spondylosis and facet arthrosis noted within the visualized upper cervical spine without high-grade spinal stenosis. No discrete or worrisome osseous lesions. Orbits: Globes and orbital soft tissues are within normal limits. Sinuses: Mild mucosal thickening noted within the right maxillary sinus. Paranasal sinuses are otherwise clear. Mastoid air cells and middle ear cavities are well pneumatized and free of fluid. Soft tissues: Asymmetric soft tissue swelling with inflammatory stranding seen involving the left face, adjacent to the left mandibular body, suspicious for acute infection/cellulitis. Changes primarily involve the left masticator and submandibular spaces. Prominent dental caries seen about the first and second mandibular molars bilaterally, as well as the right second maxillary molar, suggesting an odontogenic origin. Asymmetric soft tissue swelling along the left mandibular body suggestive of phlegmonous change (series 3, image 59). No visible discrete abscess by CT, although evaluation somewhat limited by motion and streak artifact from dental amalgam. Enlarged 1.3 cm left submandibular lymph node noted, likely reactive. Limited intracranial: Unremarkable. IMPRESSION: 1. Asymmetric soft tissue swelling with inflammatory stranding  involving the left face, suspicious for acute infection/cellulitis. Focal phlegmonous change adjacent to the left mandibular body as above without definite discrete abscess or drainable fluid collection. 2. Multiple prominent dental caries as above, suggesting an odontogenic origin. 3. Enlarged 1.3 cm left submandibular lymph node, likely reactive. Electronically Signed   By: Jeannine Boga M.D.   On: 11/11/2020 02:30    ROS Blood pressure 138/73, pulse 67, temperature 97.9 F (36.6 C), resp. rate 12, height 5\' 5"  (1.651 m), weight 90.7 kg, last menstrual period 11/29/2016, SpO2 100 %. General appearance: alert, cooperative and no distress Head: Normocephalic, without obvious abnormality, atraumatic Eyes: negative Nose: Nares normal. Septum midline. Mucosa normal. No drainage or sinus tenderness. Throat: Decayed tooth # 19 with mild buccal vestibule edema. Eruipted 17, 18 with percussion tenderness. No purulence or trismus.  Neck: no adenopathy and Tender edematous left submandibular area. Resp: clear to auscultation bilaterally Cardio: regular rate and rhythm, S1, S2 normal, no murmur, click, rub or gallop  Assessment/Plan: 50 yo F anxiety, DM, hepatic steatosis with left facial cellulitis from dental caries. Plan extraction teeth # 17, 18, 19 possible Incision and drainage tomorrow in OR.    Diona Browner 11/11/2020, 3:45 PM

## 2020-11-11 NOTE — Progress Notes (Signed)
Inpatient Diabetes Program Recommendations  AACE/ADA: New Consensus Statement on Inpatient Glycemic Control (2015)  Target Ranges:  Prepandial:   less than 140 mg/dL      Peak postprandial:   less than 180 mg/dL (1-2 hours)      Critically ill patients:  140 - 180 mg/dL   Lab Results  Component Value Date   GLUCAP 191 (H) 11/11/2020   HGBA1C 12.4 (H) 11/11/2020    Review of Glycemic Control  Diabetes history: DM2 Outpatient Diabetes medications: metformin 500 mg BID (not taking d/t nausea) Current orders for Inpatient glycemic control: Lantus 6 units BID, Novolog 0-15 units TID with meals and 0-5 HS + 4 units TID with meals  HgbA1C 12.4% Just started insulin orders Novolog totals today: 16 units correction and 4 units meal coverage    Inpatient Diabetes Program Recommendations:     If FBS > 180 mg/dL, increase Lantus to 8 units BID.  Spoke with pt at bedside regarding HgbA1C. Pt said she was not taking metformin because it made her sick. Not checking blood sugars. Will likely be discharged on insulin. If so, will teach insulin administration.  Surgery in am. Will f/u on 4/14.  Thank you. Lorenda Peck, RD, LDN, CDE Inpatient Diabetes Coordinator [phone]

## 2020-11-11 NOTE — H&P (Signed)
History and Physical   [facility] - [place] @ [place] Admission History and Physical [employer], D.O.    Patient Name: Karen Roberts MR#: [id number] Date of Birth: June 15, 1971 Date of Admission: 11/11/2020  Referring MD/NP/PA: Dr. Baird Cancer Primary Care Physician: Patient, No Pcp Per (Inactive)  Chief Complaint:  Chief Complaint  Patient presents with  . Abscess  . Facial Swelling    HPI: Karen Roberts is a 50 y.o. female with a known history of anxiety, diabetes, hepatic steatosis presents to the emergency department for evaluation of facial swelling.  Patient was in a usual state of health until pain and swelling over the left face and neck associated with some difficulty swallowing.  She notes that she has had a broken tooth which may be the source of her infection..  Patient denies fevers/chills, weakness, dizziness, chest pain, shortness of breath, N/V/C/D, abdominal pain, dysuria/frequency, changes in mental status.    Otherwise there has been no change in status. Patient has been taking medication as prescribed and there has been no recent change in medication or diet.  Denies recent antibiotics.  There has been no recent illness, hospitalizations, travel or sick contacts.    EMS/ED Course: Patient received clindamycin, Dilaudid, Toradol. Medical admission has been requested for further management of cellulitis of the left face and neck originating dental infection.  Review of Systems:  CONSTITUTIONAL: No fever/chills, fatigue, weakness, weight gain/loss, headache. EYES: No blurry or double vision. ENT: No tinnitus, postnasal drip, redness or soreness of the oropharynx. RESPIRATORY: No cough, dyspnea, wheeze.  No hemoptysis.  CARDIOVASCULAR: No chest pain, palpitations, syncope, orthopnea. No lower extremity edema.  GASTROINTESTINAL: No nausea, vomiting, abdominal pain, diarrhea, constipation.  No hematemesis, melena or hematochezia. GENITOURINARY: No  dysuria, frequency, hematuria. ENDOCRINE: No polyuria or nocturia. No heat or cold intolerance. HEMATOLOGY: No anemia, bruising, bleeding. INTEGUMENTARY: Positive pain and neck/ facial swelling as per HPI MUSCULOSKELETAL: No arthritis, gout, dyspnea. NEUROLOGIC: No numbness, tingling, ataxia, seizure-type activity, weakness. PSYCHIATRIC: No anxiety, depression, insomnia.   Past Medical History:  Diagnosis Date  . Allergy   . Anxiety   . Diabetes mellitus without complication ([place])   . Headache(784.0)    "every once in awhile"  . Hepatic steatosis 02/24/11  . Ligament tear    left; "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  . PONV (postoperative nausea and vomiting)   . Postoperative bile leak   . Sepsis [facility])     Past Surgical History:  Procedure Laterality Date  . APPENDECTOMY  2011  . CESAREAN SECTION  2000  . CHOLECYSTECTOMY  11/29/2011   Procedure: LAPAROSCOPIC CHOLECYSTECTOMY WITH INTRAOPERATIVE CHOLANGIOGRAM;  Surgeon: Imogene Burn. Georgette Dover, MD;  Location: [facility];  Service: General;  Laterality: N/A;  . ERCP  12/14/2011   Procedure: ENDOSCOPIC RETROGRADE CHOLANGIOPANCREATOGRAPHY (ERCP);  Surgeon: Gatha Mayer, MD;  Location: [facility];  Service: Endoscopy;  Laterality: N/A;  . ERCP  01/05/2012   Procedure: ENDOSCOPIC RETROGRADE CHOLANGIOPANCREATOGRAPHY (ERCP);  Surgeon: Gatha Mayer, MD;  Location: [facility];  Service: Gastroenterology;  Laterality: N/A;  . ESOPHAGOGASTRODUODENOSCOPY  02/07/2012   Procedure: ESOPHAGOGASTRODUODENOSCOPY (EGD);  Surgeon: Gatha Mayer, MD;  Location: [facility];  Service: Endoscopy;  Laterality: N/A;  endo with stent removal/ no dye needed/may need ercp scope/need xray  . RLQ abscess drain placement  12/12/11     reports that she has never smoked. She has never used smokeless tobacco. She reports current alcohol use. She reports that she does not use drugs.  Allergies  Allergen Reactions  . Ciprofloxacin Dermatitis    "dr's think it may have caused mastitis"  . Morphine  And Related Nausea Only    Nausea and headache  . Pork-Derived Products Shortness Of Breath and Nausea And Vomiting  . Morphine Other (See Comments)    Headaches, itching, anger    Family History  Problem Relation Age of Onset  . Lung cancer Maternal Grandmother   . Brain cancer Maternal Grandmother   . Diabetes Mother   . Heart failure Mother   . Anesthesia problems Neg Hx   . Colon cancer Neg Hx     Prior to Admission medications   Medication Sig Start Date End Date Taking? Authorizing Provider  aspirin EC 81 MG tablet Take 81 mg by mouth daily as needed for mild pain. Swallow whole.    [provider]  B Complex Vitamins (VITAMIN B COMPLEX PO) Take 1 tablet by mouth daily.    [provider]  Carboxymethylcellul-Glycerin (CLEAR EYES FOR DRY EYES OP) Place 1 drop into both eyes daily as needed (dry eyes).    [provider]  cholecalciferol (VITAMIN D3) 25 MCG (1000 UNIT) tablet Take 1,000 Units by mouth daily.    [provider]  Echinacea 125 MG CAPS Take 125 mg by mouth daily.    [provider]  EPINEPHrine (EPIPEN) 0.3 mg/0.3 mL DEVI Inject 0.3 mLs (0.3 mg total) into the muscle as needed. Patient not taking: Reported on 08/28/2020 07/16/12   Domenic Moras, PA-C  lisinopril (ZESTRIL) 5 MG tablet Take 1 tablet (5 mg total) by mouth daily. 08/28/20 10/27/20  Marcello Fennel, PA-C  Magnesium 100 MG CAPS Take 100 mg by mouth daily.    [provider]  metFORMIN (GLUCOPHAGE) 500 MG tablet Take 1 tablet (500 mg total) by mouth 2 (two) times daily with a meal. Patient not taking: No sig reported 08/02/19   Larene Pickett, PA-C  metFORMIN (GLUCOPHAGE) 500 MG tablet Take 1 tablet (500 mg total) by mouth 2 (two) times daily. 08/28/20 10/27/20  Marcello Fennel, PA-C  phenazopyridine (PYRIDIUM) 95 MG tablet Take 1 tablet (95 mg total) by mouth 3 (three) times daily as needed for pain. Patient not taking: No sig reported 02/07/20   Raylene Everts, MD  zinc gluconate 50 MG tablet Take 50 mg by mouth daily.    [provider]    Physical Exam: Vitals:   11/10/20 2205 11/11/20 0120 11/11/20 0200 11/11/20 0230  BP: (!) 165/100 (!) 168/108 (!) 144/96 (!) 153/97  Pulse: 96 98 93 94  Resp: 18 19 16 15   Temp: (!) 97.5 F (36.4 C)     TempSrc: Oral     SpO2: 98% 100% 99% 97%  Weight: 90.7 kg     Height: 5\' 5"  (1.651 m)       GENERAL: 50 y.o.-year-old female patient, well-developed, well-nourished lying in the bed in no acute distress.  Pleasant and cooperative.   HEENT: Head atraumatic, normocephalic. Pupils equal. Mucus membranes moist, poor dentition, swelling along left mandible.  Warm and decreased range of motion 2/2 pain. NECK: Supple. FROM, No JVD. Left anterior lymph nodes.  CHEST: Normal breath sounds bilaterally. No wheezing, rales, rhonchi or crackles. No use of accessory muscles of respiration.  No reproducible chest wall tenderness.  CARDIOVASCULAR: S1, S2 normal. No murmurs, rubs, or gallops. Cap refill <2 seconds. Pulses intact distally.  ABDOMEN: Soft, nondistended, nontender. No rebound, guarding, rigidity. Normoactive bowel sounds present in  all four quadrants.  EXTREMITIES: No pedal edema, cyanosis, or clubbing. No calf tenderness or Homan's sign.  NEUROLOGIC: The patient is alert and oriented x 3. Cranial nerves II through XII are grossly intact with no focal sensorimotor deficit. PSYCHIATRIC:  Normal affect, mood, thought content. SKIN: Warm, dry, and intact without obvious rash, lesion, or ulcer.    Labs on Admission:  CBC: Recent Labs  Lab 11/10/20 2236  WBC 8.2  NEUTROABS 5.0  HGB 13.7  HCT 40.0  MCV 87.7  PLT 161   Basic Metabolic Panel: Recent Labs  Lab 11/10/20 2236  NA 137  K 3.4*  CL 103  CO2 26  GLUCOSE 275*  BUN 15  CREATININE 0.90  CALCIUM 9.4   GFR: Estimated Creatinine Clearance: 82.3 mL/min (by C-G formula based on SCr of 0.9 mg/dL). Liver Function  Tests: No results for input(s): AST, ALT, ALKPHOS, BILITOT, PROT, ALBUMIN in the last 168 hours. No results for input(s): LIPASE, AMYLASE in the last 168 hours. No results for input(s): AMMONIA in the last 168 hours. Coagulation Profile: No results for input(s): INR, PROTIME in the last 168 hours. Cardiac Enzymes: No results for input(s): CKTOTAL, CKMB, CKMBINDEX, TROPONINI in the last 168 hours. BNP (last 3 results) No results for input(s): PROBNP in the last 8760 hours. HbA1C: No results for input(s): HGBA1C in the last 72 hours. CBG: No results for input(s): GLUCAP in the last 168 hours. Lipid Profile: No results for input(s): CHOL, HDL, LDLCALC, TRIG, CHOLHDL, LDLDIRECT in the last 72 hours. Thyroid Function Tests: No results for input(s): TSH, T4TOTAL, FREET4, T3FREE, THYROIDAB in the last 72 hours. Anemia Panel: No results for input(s): VITAMINB12, FOLATE, FERRITIN, TIBC, IRON, RETICCTPCT in the last 72 hours. Urine analysis:    Component Value Date/Time   COLORURINE STRAW (A) 08/28/2020 1615   APPEARANCEUR CLEAR 08/28/2020 1615   LABSPEC 1.026 08/28/2020 1615   PHURINE 5.0 08/28/2020 1615   GLUCOSEU >=500 (A) 08/28/2020 1615   HGBUR SMALL (A) 08/28/2020 1615   BILIRUBINUR NEGATIVE 08/28/2020 1615   KETONESUR NEGATIVE 08/28/2020 1615   PROTEINUR NEGATIVE 08/28/2020 1615   UROBILINOGEN 0.2 02/07/2020 1901   NITRITE NEGATIVE 08/28/2020 1615   LEUKOCYTESUR NEGATIVE 08/28/2020 1615   Sepsis Labs: @LABRCNTIP (procalcitonin:4,lacticidven:4) )No results found for this or any previous visit (from the past 240 hour(s)).   Radiological Exams on Admission: CT Maxillofacial W Contrast  Result Date: 11/11/2020 CLINICAL DATA:  Initial evaluation for acute left-sided facial swelling. EXAM: CT MAXILLOFACIAL WITH CONTRAST TECHNIQUE: Multidetector CT imaging of the maxillofacial structures was performed with intravenous contrast. Multiplanar CT image reconstructions were also generated.  CONTRAST:  59mL OMNIPAQUE IOHEXOL 300 MG/ML  SOLN COMPARISON:  None. FINDINGS: Osseous: No acute osseous abnormality seen about the face. 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 Multilevel degenerative spondylosis and facet arthrosis noted within the visualized upper cervical spine without high-grade spinal stenosis. No discrete or worrisome osseous lesions. Orbits: Globes and orbital soft tissues are within normal limits. Sinuses: Mild mucosal thickening noted within the right maxillary sinus. Paranasal sinuses are otherwise clear. Mastoid air cells and middle ear cavities are well pneumatized and free of fluid. Soft tissues: Asymmetric soft tissue swelling with inflammatory stranding seen involving the left face, adjacent to the left mandibular body, suspicious for acute infection/cellulitis. Changes primarily involve the left masticator and submandibular spaces. Prominent dental caries seen about the first and second mandibular molars bilaterally, as well as the right second maxillary molar, suggesting an odontogenic origin. Asymmetric  soft tissue swelling along the left mandibular body suggestive of phlegmonous change (series 3, image 59). No visible discrete abscess by CT, although evaluation somewhat limited by motion and streak artifact from dental amalgam. Enlarged 1.3 cm left submandibular lymph node noted, likely reactive. Limited intracranial: Unremarkable. IMPRESSION: 1. Asymmetric soft tissue swelling with inflammatory stranding involving the left face, suspicious for acute infection/cellulitis. Focal phlegmonous change adjacent to the left mandibular body as above without definite discrete abscess or drainable fluid collection. 2. Multiple prominent dental caries as above, suggesting an odontogenic origin. 3. Enlarged 1.3 cm left submandibular lymph node, likely reactive. Electronically Signed   By: Jeannine Boga M.D.   On: 11/11/2020 02:30       Assessment/Plan  This is a 50 y.o. female with a history of anxiety, diabetes, hepatic steatosis now being admitted with:  #.  Left face and neck cellulitis, dental infection -Admit inpatient -Continue IV Clinda -Probiotics -IV fluid hydration -We will consult oral surgery in a.m. -Pain control -Hold aspirin  #. H/o Diabetes - Accuchecks achs with RISS coverage - Heart healthy, carb controlled diet -Continue lisinopril -Hold Metformin  Admission status: Inpatient IV Fluids: NS Diet/Nutrition: Carb controlled Consults called: Oral surgery to be called in AM  DVT Px: SCDs and early ambulation. Code Status: Full Code  Disposition Plan: To home in 1-2 days  All the records are reviewed and case discussed with ED provider. Management plans discussed with the patient and/or family who express understanding and agree with plan of care.  Shaniya Tashiro D.O. on 11/11/2020 at 3:06 AM CC: Primary care physician; Patient, No Pcp Per (Inactive)   11/11/2020, 3:06 AM

## 2020-11-11 NOTE — Progress Notes (Signed)
PROGRESS NOTE  Karen Roberts JKD:[id number] DOB: 01-Oct-1970   PCP: Patient, No Pcp Per (Inactive)  Patient is from: Home  DOA: 11/11/2020 LOS: 0  Chief complaints: Left face swelling  Brief Narrative / Interim history: 50 year old F with PMH of DM-2, hepatic steatosis and anxiety presenting with facial swelling, and admitted for left facial and neck cellulitis of odontogenic origin.  Maxillofacial CT concerning for cellulitis, phlegmon and dental caries.  Patient was started on amoxicillin and clindamycin and admitted.  Subjective: Seen and examined earlier this morning.  No major events overnight of this morning.  Continues to endorse significant pain in her neck and face.  She rates her pain 7/10.  Pain is worse with neck movement.  Also reports some discomfort with swallowing.  Denies trouble breathing.  She think the swelling is better but not sure.  She denies alcohol use.  Objective: Vitals:   11/11/20 0340 11/11/20 0405 11/11/20 1016 11/11/20 1425  BP: (!) 145/82 (!) 169/91 (!) 137/94 138/73  Pulse: 95 96 67 67  Resp: 16 17 18 12   Temp: 97.8 F (36.6 C) 97.9 F (36.6 C) 97.6 F (36.4 C) 97.9 F (36.6 C)  TempSrc: Oral Oral Oral   SpO2: 94% 93% 99% 100%  Weight:      Height:        Intake/Output Summary (Last 24 hours) at 11/11/2020 1446 Last data filed at 11/11/2020 1300 Gross per 24 hour  Intake 697.05 ml  Output 700 ml  Net -2.95 ml   Filed Weights   11/10/20 2205  Weight: 90.7 kg    Examination:  GENERAL: No apparent distress.  Nontoxic. HEENT: MMM.  Notable dental caries in 3 of left lower molars and one left upper molar NECK: Left facial swelling down to her neck.  Some range of motion but limited.  Mild trismus. RESP:  No IWOB.  Fair aeration bilaterally. CVS:  RRR. Heart sounds normal.  ABD/GI/GU: BS+. Abd soft, NTND.  MSK/EXT:  Moves extremities. No apparent deformity. No edema.  SKIN: no apparent skin lesion or wound NEURO: Awake, alert and  oriented appropriately.  No apparent focal neuro deficit. PSYCH: Calm. Normal affect.  Procedures:  None  Microbiology summarized: [id number] and influenza PCR nonreactive.  Assessment & Plan: Left facial cellulitis-maxillofacial CT with cellulitis and focal phlegmonous change next to left mandible without discrete abscess or drainable fluid collection.  She has notable dental caries on the left. -Continue antibiotics with IV Unasyn -Continue IV fluid hydration -Oral surgery, Dr. Hoyt Koch consulted.  Uncontrolled DM-2 with hyperglycemia: A1c 12.4%.  Only on Metformin at home. Recent Labs  Lab 11/11/20 0746 11/11/20 1150  GLUCAP 263* 227*  -Continue SSI-moderate -Add mealtime coverage at 4 units 3 times daily -Add Lantus at 6 units twice daily -Check a.m. lipid panel -Continue home lisinopril  Elevated liver enzymes: Due to alcohol?  She denies alcohol in the last 1 week.  CK within normal. -Continue monitoring  Hypokalemia: K3.4.  Mg 1.9. -P.o. K-Dur 40 mEq x 2  Essential hypertension: Improved. -Continue home lisinopril   Class I obesity Body mass index is 33.28 kg/m.         DVT prophylaxis:  SCDs Start: 11/11/20 0402  Code Status: Full code Family Communication: Patient and/or RN. Available if any question.  Level of care: Med-Surg Status is: Inpatient  Remains inpatient appropriate because:Persistent severe electrolyte disturbances, Ongoing active pain requiring inpatient pain management, IV treatments appropriate due to intensity of illness or inability to take PO and  Inpatient level of care appropriate due to severity of illness   Dispo: The patient is from: Home              Anticipated d/c is to: Home              Patient currently is not medically stable to d/c.   Difficult to place patient No       Consultants:  Oral surgery   Sch Meds:  Scheduled Meds: . acidophilus  2 capsule Oral Daily  . cholecalciferol  1,000 Units Oral Daily  .  insulin aspart  0-15 Units Subcutaneous TID WC  . insulin aspart  0-5 Units Subcutaneous QHS  . lisinopril  5 mg Oral Daily  . potassium chloride  40 mEq Oral Q4H   Continuous Infusions: . sodium chloride 75 mL/hr at 11/11/20 0428  . ampicillin-sulbactam (UNASYN) IV 3 g (11/11/20 1337)   PRN Meds:.acetaminophen **OR** acetaminophen, bisacodyl, HYDROmorphone (DILAUDID) injection, ketorolac, magnesium citrate, ondansetron **OR** ondansetron (ZOFRAN) IV, polyvinyl alcohol, senna-docusate, traZODone  Antimicrobials: Anti-infectives (From admission, onward)   Start     Dose/Rate Route Frequency Ordered Stop   11/11/20 0800  Ampicillin-Sulbactam (UNASYN) 3 g in sodium chloride 0.9 % 100 mL IVPB        3 g 200 mL/hr over 30 Minutes Intravenous Every 6 hours 11/11/20 0734     11/11/20 0245  clindamycin (CLEOCIN) IVPB 600 mg        600 mg 100 mL/hr over 30 Minutes Intravenous  Once 11/11/20 0244 11/11/20 0323   11/10/20 2230  amoxicillin (AMOXIL) capsule 500 mg        500 mg Oral  Once 11/10/20 2227 11/11/20 0128       I have personally reviewed the following labs and images: CBC: Recent Labs  Lab 11/10/20 2236 11/11/20 0426  WBC 8.2 7.5  NEUTROABS 5.0  --   HGB 13.7 14.5  HCT 40.0 43.7  MCV 87.7 92.0  PLT 294 254   BMP &GFR Recent Labs  Lab 11/10/20 2236 11/11/20 0426  NA 137 137  K 3.4* 3.4*  CL 103 99  CO2 26 28  GLUCOSE 275* 313*  BUN 15 14  CREATININE 0.90 0.81  CALCIUM 9.4 9.3  MG  --  1.9   Estimated Creatinine Clearance: 91.4 mL/min (by C-G formula based on SCr of 0.81 mg/dL). Liver & Pancreas: Recent Labs  Lab 11/11/20 0426  AST 138*  ALT 81*  ALKPHOS 155*  BILITOT 0.5  PROT 7.3  ALBUMIN 3.8   No results for input(s): LIPASE, AMYLASE in the last 168 hours. No results for input(s): AMMONIA in the last 168 hours. Diabetic: Recent Labs    11/11/20 0426  HGBA1C 12.4*   Recent Labs  Lab 11/11/20 0746 11/11/20 1150  GLUCAP 263* 227*   Cardiac  Enzymes: Recent Labs  Lab 11/11/20 0426  CKTOTAL 83   No results for input(s): PROBNP in the last 8760 hours. Coagulation Profile: No results for input(s): INR, PROTIME in the last 168 hours. Thyroid Function Tests: No results for input(s): TSH, T4TOTAL, FREET4, T3FREE, THYROIDAB in the last 72 hours. Lipid Profile: No results for input(s): CHOL, HDL, LDLCALC, TRIG, CHOLHDL, LDLDIRECT in the last 72 hours. Anemia Panel: No results for input(s): VITAMINB12, FOLATE, FERRITIN, TIBC, IRON, RETICCTPCT in the last 72 hours. Urine analysis:    Component Value Date/Time   COLORURINE STRAW (A) 08/28/2020 1615   APPEARANCEUR CLEAR 08/28/2020 1615   LABSPEC 1.026 08/28/2020 1615  PHURINE 5.0 08/28/2020 1615   GLUCOSEU >=500 (A) 08/28/2020 1615   HGBUR SMALL (A) 08/28/2020 1615   BILIRUBINUR NEGATIVE 08/28/2020 1615   KETONESUR NEGATIVE 08/28/2020 1615   PROTEINUR NEGATIVE 08/28/2020 1615   UROBILINOGEN 0.2 02/07/2020 1901   NITRITE NEGATIVE 08/28/2020 1615   LEUKOCYTESUR NEGATIVE 08/28/2020 1615   Sepsis Labs: Invalid input(s): PROCALCITONIN, [place]  Microbiology: Recent Results (from the past 240 hour(s))  Resp Panel by RT-PCR (Flu A&B, Covid) Nasopharyngeal Swab     Status: None   Collection Time: 11/11/20  2:45 AM   Specimen: Nasopharyngeal Swab; Nasopharyngeal(NP) swabs in vial transport medium  Result Value Ref Range Status   SARS Coronavirus 2 by RT PCR NEGATIVE NEGATIVE Final    Comment: (NOTE) SARS-CoV-2 target nucleic acids are NOT DETECTED.  The SARS-CoV-2 RNA is generally detectable in upper respiratory specimens during the acute phase of infection. The lowest concentration of SARS-CoV-2 viral copies this assay can detect is 138 copies/mL. A negative result does not preclude SARS-Cov-2 infection and should not be used as the sole basis for treatment or other patient management decisions. A negative result may occur with  improper specimen collection/handling,  submission of specimen other than nasopharyngeal swab, presence of viral mutation(s) within the areas targeted by this assay, and inadequate number of viral copies(<138 copies/mL). A negative result must be combined with clinical observations, patient history, and epidemiological information. The expected result is Negative.  Fact Sheet for Patients:  EntrepreneurPulse.com.au  Fact Sheet for Healthcare Providers:  IncredibleEmployment.be  This test is no t yet approved or cleared by the [place] FDA and  has been authorized for detection and/or diagnosis of SARS-CoV-2 by FDA under an Emergency Use Authorization (EUA). This EUA will remain  in effect (meaning this test can be used) for the duration of the COVID-19 declaration under Section 564(b)(1) of the Act, 21 U.S.C.section 360bbb-3(b)(1), unless the authorization is terminated  or revoked sooner.       Influenza A by PCR NEGATIVE NEGATIVE Final   Influenza B by PCR NEGATIVE NEGATIVE Final    Comment: (NOTE) The Xpert Xpress SARS-CoV-2/FLU/RSV plus assay is intended as an aid in the diagnosis of influenza from Nasopharyngeal swab specimens and should not be used as a sole basis for treatment. Nasal washings and aspirates are unacceptable for Xpert Xpress SARS-CoV-2/FLU/RSV testing.  Fact Sheet for Patients: EntrepreneurPulse.com.au  Fact Sheet for Healthcare Providers: IncredibleEmployment.be  This test is not yet approved or cleared by the [place] FDA and has been authorized for detection and/or diagnosis of SARS-CoV-2 by FDA under an Emergency Use Authorization (EUA). This EUA will remain in effect (meaning this test can be used) for the duration of the COVID-19 declaration under Section 564(b)(1) of the Act, 21 U.S.C. section 360bbb-3(b)(1), unless the authorization is terminated or revoked.  Performed at [facility], [place] [address], [place],  [address]     Radiology Studies: CT Maxillofacial W Contrast  Result Date: 11/11/2020 CLINICAL DATA:  Initial evaluation for acute left-sided facial swelling. EXAM: CT MAXILLOFACIAL WITH CONTRAST TECHNIQUE: Multidetector CT imaging of the maxillofacial structures was performed with intravenous contrast. Multiplanar CT image reconstructions were also generated. CONTRAST:  70mL OMNIPAQUE IOHEXOL 300 MG/ML  SOLN COMPARISON:  None. FINDINGS: Osseous: No acute osseous abnormality seen about the face. 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 Multilevel degenerative spondylosis and facet arthrosis noted within the visualized upper cervical spine without high-grade spinal stenosis. No discrete or worrisome osseous lesions.  Orbits: Globes and orbital soft tissues are within normal limits. Sinuses: Mild mucosal thickening noted within the right maxillary sinus. Paranasal sinuses are otherwise clear. Mastoid air cells and middle ear cavities are well pneumatized and free of fluid. Soft tissues: Asymmetric soft tissue swelling with inflammatory stranding seen involving the left face, adjacent to the left mandibular body, suspicious for acute infection/cellulitis. Changes primarily involve the left masticator and submandibular spaces. Prominent dental caries seen about the first and second mandibular molars bilaterally, as well as the right second maxillary molar, suggesting an odontogenic origin. Asymmetric soft tissue swelling along the left mandibular body suggestive of phlegmonous change (series 3, image 59). No visible discrete abscess by CT, although evaluation somewhat limited by motion and streak artifact from dental amalgam. Enlarged 1.3 cm left submandibular lymph node noted, likely reactive. Limited intracranial: Unremarkable. IMPRESSION: 1. Asymmetric soft tissue swelling with inflammatory stranding involving the left face, suspicious  for acute infection/cellulitis. Focal phlegmonous change adjacent to the left mandibular body as above without definite discrete abscess or drainable fluid collection. 2. Multiple prominent dental caries as above, suggesting an odontogenic origin. 3. Enlarged 1.3 cm left submandibular lymph node, likely reactive. Electronically Signed   By: Jeannine Boga M.D.   On: 11/11/2020 02:30     Lesia [place]  If 7PM-7AM, please contact night-coverage [email or web address] 11/11/2020, 2:46 PM

## 2020-11-11 NOTE — ED Provider Notes (Signed)
[facility] DEPT Provider Note   CSN: [id number] Arrival date & time: 11/10/20  2116     History Chief Complaint  Patient presents with  . Abscess  . Facial Swelling    Karen Roberts is a 50 y.o. female.  The history is provided by the patient and medical records.  Abscess   50 y.o. F with hx of anxiety, DM, fatty liver, presenting to the ED with left sided dental pain and facial swelling.  Patient reports she has broken left molar which has been broken for quite some time.  States she just darted having pain to the area over the past few days.  Overnight she had acute worsening of pain and new swelling along left side of her face and into the neck.  She reports she feels like she is starting to have some difficulty swallowing but denies any difficulty breathing.  She has not had any noted fevers.  She is not currently established with dentist.  She has been taking over-the-counter medications without much relief.  Past Medical History:  Diagnosis Date  . Allergy   . Anxiety   . Diabetes mellitus without complication ([place])   . Headache(784.0)    "every once in awhile"  . Hepatic steatosis 02/24/11  . Ligament tear    left; "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  . PONV (postoperative nausea and vomiting)   . Postoperative bile leak   . Sepsis [facility])     Patient Active Problem List   Diagnosis Date Noted  . Bile leak, postoperative 12/13/2011  . 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 APPENDECTOMY  2011  . CESAREAN SECTION  2000  . CHOLECYSTECTOMY  11/29/2011   Procedure: LAPAROSCOPIC CHOLECYSTECTOMY WITH INTRAOPERATIVE CHOLANGIOGRAM;  Surgeon: Imogene Burn. Georgette Dover, MD;  Location: [place];  Service: General;  Laterality: N/A;  . ERCP  12/14/2011   Procedure: ENDOSCOPIC RETROGRADE CHOLANGIOPANCREATOGRAPHY (ERCP);  Surgeon: Gatha Mayer, MD;  Location: [place];  Service: Endoscopy;  Laterality: N/A;  . ERCP   01/05/2012   Procedure: ENDOSCOPIC RETROGRADE CHOLANGIOPANCREATOGRAPHY (ERCP);  Surgeon: Gatha Mayer, MD;  Location: [facility];  Service: Gastroenterology;  Laterality: N/A;  . ESOPHAGOGASTRODUODENOSCOPY  02/07/2012   Procedure: ESOPHAGOGASTRODUODENOSCOPY (EGD);  Surgeon: Gatha Mayer, MD;  Location: [facility];  Service: Endoscopy;  Laterality: N/A;  endo with stent removal/ no dye needed/may need ercp scope/need xray  . RLQ abscess drain placement  12/12/11     OB History   No obstetric history on file.     Family History  Problem Relation Age of Onset  . Lung cancer Maternal Grandmother   . Brain cancer Maternal Grandmother   . Diabetes Mother   . Heart failure Mother   . Anesthesia problems Neg Hx   . Colon cancer Neg Hx     Social History   Tobacco Use  . Smoking status: Never Smoker  . Smokeless tobacco: Never Used  Vaping Use  . Vaping Use: Never used  Substance Use Topics  . Alcohol use: Yes    Comment: 12/12/11 "maybe once/ monthly [place] iced teas"  . Drug use: No    Home Medications Prior to Admission medications   Medication Sig Start Date End Date Taking? Authorizing Provider  aspirin EC 81 MG tablet Take 81 mg by mouth daily as needed for mild pain. Swallow whole.    [provider]  B Complex Vitamins (VITAMIN B COMPLEX PO) Take  1 tablet by mouth daily.    [provider]  Carboxymethylcellul-Glycerin (CLEAR EYES FOR DRY EYES OP) Place 1 drop into both eyes daily as needed (dry eyes).    [provider]  cholecalciferol (VITAMIN D3) 25 MCG (1000 UNIT) tablet Take 1,000 Units by mouth daily.    [provider]  Echinacea 125 MG CAPS Take 125 mg by mouth daily.    [provider]  EPINEPHrine (EPIPEN) 0.3 mg/0.3 mL DEVI Inject 0.3 mLs (0.3 mg total) into the muscle as needed. Patient not taking: Reported on 08/28/2020 07/16/12   Domenic Moras, PA-C  lisinopril (ZESTRIL) 5 MG tablet Take 1 tablet (5 mg total) by  mouth daily. 08/28/20 10/27/20  Marcello Fennel, PA-C  Magnesium 100 MG CAPS Take 100 mg by mouth daily.    [provider]  metFORMIN (GLUCOPHAGE) 500 MG tablet Take 1 tablet (500 mg total) by mouth 2 (two) times daily with a meal. Patient not taking: No sig reported 08/02/19   Larene Pickett, PA-C  metFORMIN (GLUCOPHAGE) 500 MG tablet Take 1 tablet (500 mg total) by mouth 2 (two) times daily. 08/28/20 10/27/20  Marcello Fennel, PA-C  phenazopyridine (PYRIDIUM) 95 MG tablet Take 1 tablet (95 mg total) by mouth 3 (three) times daily as needed for pain. Patient not taking: No sig reported 02/07/20   Raylene Everts, MD  zinc gluconate 50 MG tablet Take 50 mg by mouth daily.    [provider]    Allergies    Ciprofloxacin, Morphine and related, Pork-derived products, and Morphine  Review of Systems   Review of Systems  HENT: Positive for dental problem.   All other systems reviewed and are negative.   Physical Exam Updated Vital Signs BP (!) 165/100 (BP Location: Left Arm)   Pulse 96   Temp (!) 97.5 F (36.4 C) (Oral)   Resp 18   Ht 5\' 5"  (1.651 m)   Wt 90.7 kg   LMP 11/29/2016 (Approximate)   SpO2 98%   BMI 33.28 kg/m   Physical Exam Vitals and nursing note reviewed.  Constitutional:      Appearance: She is well-developed.  HENT:     Head: Normocephalic and atraumatic.     Mouth/Throat:     Comments: Teeth largely in fair dentition, left lower molars are all decayed, first molar is broken along posterior aspect with central decay, surrounding gingiva swollen, handling secretions appropriately, no trismus, there is swelling noted into left mandible and somewhat into the neck, overlying skin is mildly erythematous and warm to touch Eyes:     Conjunctiva/sclera: Conjunctivae normal.     Pupils: Pupils are equal, round, and reactive to light.  Cardiovascular:     Rate and Rhythm: Normal rate and regular rhythm.     Heart sounds: Normal heart sounds.   Pulmonary:     Effort: Pulmonary effort is normal.     Breath sounds: Normal breath sounds.  Abdominal:     General: Bowel sounds are normal.     Palpations: Abdomen is soft.  Musculoskeletal:        General: Normal range of motion.     Cervical back: Normal range of motion.  Skin:    General: Skin is warm and dry.  Neurological:     Mental Status: She is alert and oriented to person, place, and time.     ED Results / Procedures / Treatments   Labs (all labs ordered are listed, but only abnormal  results are displayed) Labs Reviewed  BASIC METABOLIC PANEL - Abnormal; Notable for the following components:      Result Value   Potassium 3.4 (*)    Glucose, Bld 275 (*)    All other components within normal limits  RESP PANEL BY RT-PCR (FLU A&B, COVID) [id number]  CBC WITH DIFFERENTIAL/PLATELET    EKG None  Radiology CT Maxillofacial W Contrast  Result Date: 11/11/2020 CLINICAL DATA:  Initial evaluation for acute left-sided facial swelling. EXAM: CT MAXILLOFACIAL WITH CONTRAST TECHNIQUE: Multidetector CT imaging of the maxillofacial structures was performed with intravenous contrast. Multiplanar CT image reconstructions were also generated. CONTRAST:  15mL OMNIPAQUE IOHEXOL 300 MG/ML  SOLN COMPARISON:  None. FINDINGS: Osseous: No acute osseous abnormality seen about the face. 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 Multilevel degenerative spondylosis and facet arthrosis noted within the visualized upper cervical spine without high-grade spinal stenosis. No discrete or worrisome osseous lesions. Orbits: Globes and orbital soft tissues are within normal limits. Sinuses: Mild mucosal thickening noted within the right maxillary sinus. Paranasal sinuses are otherwise clear. Mastoid air cells and middle ear cavities are well pneumatized and free of fluid. Soft tissues: Asymmetric soft tissue swelling with inflammatory stranding seen involving the left face, adjacent  to the left mandibular body, suspicious for acute infection/cellulitis. Changes primarily involve the left masticator and submandibular spaces. Prominent dental caries seen about the first and second mandibular molars bilaterally, as well as the right second maxillary molar, suggesting an odontogenic origin. Asymmetric soft tissue swelling along the left mandibular body suggestive of phlegmonous change (series 3, image 59). No visible discrete abscess by CT, although evaluation somewhat limited by motion and streak artifact from dental amalgam. Enlarged 1.3 cm left submandibular lymph node noted, likely reactive. Limited intracranial: Unremarkable. IMPRESSION: 1. Asymmetric soft tissue swelling with inflammatory stranding involving the left face, suspicious for acute infection/cellulitis. Focal phlegmonous change adjacent to the left mandibular body as above without definite discrete abscess or drainable fluid collection. 2. Multiple prominent dental caries as above, suggesting an odontogenic origin. 3. Enlarged 1.3 cm left submandibular lymph node, likely reactive. Electronically Signed   By: Jeannine Boga M.D.   On: 11/11/2020 02:30    Procedures Procedures   Medications Ordered in ED Medications  ketorolac (TORADOL) 30 MG/ML injection 30 mg (30 mg Intravenous Given 11/11/20 0127)  amoxicillin (AMOXIL) capsule 500 mg (500 mg Oral Given 11/11/20 0128)  iohexol (OMNIPAQUE) 300 MG/ML solution 75 mL (75 mLs Intravenous Contrast Given 11/11/20 0146)  clindamycin (CLEOCIN) IVPB 600 mg (600 mg Intravenous New Bag/Given 11/11/20 0253)  HYDROmorphone (DILAUDID) injection 1 mg (1 mg Intravenous Given 11/11/20 0251)    ED Course  I have reviewed the triage vital signs and the nursing notes.  Pertinent labs & imaging results that were available during my care of the patient were reviewed by me and considered in my medical decision making (see chart for details).    MDM Rules/Calculators/A&P  50 y.o. F  here with left sided facial swelling.  Broke a tooth a while back, pain onset this week but rapid swelling occurred overnight.  Does have swelling to left jaw and extending into left neck.  She complains of some trouble swallowing but normal phonation , no stridor, tolerating secretions well.  She is able to drink water here.  Labs are overall reassuring.  CT max/face ordered.  Given pain medications.  2:45 AM Patient reassessed, states she is still in a great deal of pain.  She  was able to swallow oral medications.  CT with infection to the left side of face with development of apparent cellulitis but there is no discrete abscess or drainable fluid collection.  Origin appears to be dental.  Does have some reactive lymph nodes.  She is maintaining her airway at present, no drooling or stridor.  Clinically not ludwig's angina at present but given swelling extending into the neck and rapid progression of symptoms, I do feel will be beneficial to admit for IV antibiotics.  We do currently have oral surgery on-call (Dr. Hoyt Koch) so I feel they can be consulted in the morning for further evaluation/management as no acute intervention at present.  Will start IV clindamycin.  Discussed with hospitalist, Dr. Ara Kussmaul-- will admit for ongoing care.  Final Clinical Impression(s) / ED Diagnoses Final diagnoses:  Facial cellulitis    Rx / DC Orders ED Discharge Orders    None       Larene Pickett, PA-C 11/11/20 0345    Ripley Fraise, MD 11/11/20 [phone]

## 2020-11-12 ENCOUNTER — Inpatient Hospital Stay (HOSPITAL_COMMUNITY): Payer: Self-pay | Admitting: Certified Registered Nurse Anesthetist

## 2020-11-12 ENCOUNTER — Encounter (HOSPITAL_COMMUNITY): Payer: Self-pay | Admitting: Family Medicine

## 2020-11-12 ENCOUNTER — Encounter (HOSPITAL_COMMUNITY)
Admission: EM | Disposition: A | Discharge status: Home / Self Care | Payer: Self-pay | Source: Home / Self Care | Attending: Student

## 2020-11-12 HISTORY — PX: INCISION AND DRAINAGE ABSCESS: SHX5864

## 2020-11-12 HISTORY — PX: TOOTH EXTRACTION: SHX859

## 2020-11-12 LAB — COMPREHENSIVE METABOLIC PANEL
ALT: 176 U/L — ABNORMAL HIGH (ref 0–44)
AST: 112 U/L — ABNORMAL HIGH (ref 15–41)
Albumin: 3.2 g/dL — ABNORMAL LOW (ref 3.5–5.0)
Alkaline Phosphatase: 147 U/L — ABNORMAL HIGH (ref 38–126)
Anion gap: 9 (ref 5–15)
BUN: 9 mg/dL (ref 6–20)
CO2: 22 mmol/L (ref 22–32)
Calcium: 8.8 mg/dL — ABNORMAL LOW (ref 8.9–10.3)
Chloride: 103 mmol/L (ref 98–111)
Creatinine, Ser: 0.76 mg/dL (ref 0.44–1.00)
GFR, Estimated: >60 mL/min (ref >60)
Glucose, Bld: 208 mg/dL — ABNORMAL HIGH (ref 70–99)
Potassium: 4.1 mmol/L (ref 3.5–5.1)
Sodium: 134 mmol/L — ABNORMAL LOW (ref 135–145)
Total Bilirubin: 0.9 mg/dL (ref 0.3–1.2)
Total Protein: 6 g/dL — ABNORMAL LOW (ref 6.5–8.1)

## 2020-11-12 LAB — CBC
HCT: 41.3 % (ref 36.0–46.0)
Hemoglobin: 13.4 g/dL (ref 12.0–15.0)
MCH: 29.8 pg (ref 26.0–34.0)
MCHC: 32.4 g/dL (ref 30.0–36.0)
MCV: 91.8 fL (ref 80.0–100.0)
Platelets: 302 10*3/uL (ref 150–400)
RBC: 4.5 MIL/uL (ref 3.87–5.11)
RDW: 12.5 % (ref 11.5–15.5)
WBC: 10.1 10*3/uL (ref 4.0–10.5)
nRBC: 0 % (ref 0.0–0.2)

## 2020-11-12 LAB — GLUCOSE, CAPILLARY
Glucose-Capillary: 165 mg/dL — ABNORMAL HIGH (ref 70–99)
Glucose-Capillary: 120 mg/dL — ABNORMAL HIGH (ref 70–99)
Glucose-Capillary: 129 mg/dL — ABNORMAL HIGH (ref 70–99)
Glucose-Capillary: 154 mg/dL — ABNORMAL HIGH (ref 70–99)
Glucose-Capillary: 313 mg/dL — ABNORMAL HIGH (ref 70–99)

## 2020-11-12 LAB — HEPATITIS PANEL, ACUTE
HCV Ab: NONREACTIVE
Hep A IgM: NONREACTIVE
Hep B C IgM: NONREACTIVE
Hepatitis B Surface Ag: NONREACTIVE

## 2020-11-12 LAB — PHOSPHORUS: Phosphorus: 2.7 mg/dL (ref 2.5–4.6)

## 2020-11-12 LAB — MAGNESIUM: Magnesium: 1.6 mg/dL — ABNORMAL LOW (ref 1.7–2.4)

## 2020-11-12 LAB — SURGICAL PCR SCREEN
MRSA, PCR: NEGATIVE
Staphylococcus aureus: POSITIVE — AB

## 2020-11-12 SURGERY — DENTAL RESTORATION/EXTRACTIONS
Anesthesia: General

## 2020-11-12 MED ORDER — ONDANSETRON HCL 4 MG/2ML IJ SOLN
INTRAMUSCULAR | Status: AC
  Filled 2020-11-12: qty 2

## 2020-11-12 MED ORDER — DEXMEDETOMIDINE (PRECEDEX) IN NS 20 MCG/5ML (4 MCG/ML) IV SYRINGE
PREFILLED_SYRINGE | INTRAVENOUS | Status: DC | PRN
  Administered 2020-11-12 (×2): 8 ug via INTRAVENOUS

## 2020-11-12 MED ORDER — PHENYLEPHRINE 40 MCG/ML (10ML) SYRINGE FOR IV PUSH (FOR BLOOD PRESSURE SUPPORT)
PREFILLED_SYRINGE | INTRAVENOUS | Status: DC | PRN
  Administered 2020-11-12: 120 ug via INTRAVENOUS

## 2020-11-12 MED ORDER — FENTANYL CITRATE (PF) 100 MCG/2ML IJ SOLN
INTRAMUSCULAR | Status: AC
  Filled 2020-11-12: qty 2

## 2020-11-12 MED ORDER — LIDOCAINE HCL 2 % IJ SOLN
INTRAMUSCULAR | Status: AC
  Filled 2020-11-12: qty 20

## 2020-11-12 MED ORDER — LIDOCAINE-EPINEPHRINE 2 %-1:100000 IJ SOLN
INTRAMUSCULAR | Status: AC
  Filled 2020-11-12: qty 1

## 2020-11-12 MED ORDER — LIDOCAINE 2% (20 MG/ML) 5 ML SYRINGE
INTRAMUSCULAR | Status: AC
  Filled 2020-11-12: qty 5

## 2020-11-12 MED ORDER — PHENYLEPHRINE HCL-NACL 10-0.9 MG/250ML-% IV SOLN
INTRAVENOUS | Status: DC | PRN
  Administered 2020-11-12: 60 ug/min via INTRAVENOUS

## 2020-11-12 MED ORDER — PROPOFOL 10 MG/ML IV BOLUS
INTRAVENOUS | Status: DC | PRN
  Administered 2020-11-12: 200 mg via INTRAVENOUS

## 2020-11-12 MED ORDER — SUGAMMADEX SODIUM 200 MG/2ML IV SOLN
INTRAVENOUS | Status: DC | PRN
  Administered 2020-11-12: 400 mg via INTRAVENOUS

## 2020-11-12 MED ORDER — LIDOCAINE-EPINEPHRINE 2 %-1:100000 IJ SOLN
INTRAMUSCULAR | Status: DC | PRN
  Administered 2020-11-12: 7.5 mL

## 2020-11-12 MED ORDER — ROCURONIUM BROMIDE 10 MG/ML (PF) SYRINGE
PREFILLED_SYRINGE | INTRAVENOUS | Status: DC | PRN
  Administered 2020-11-12: 70 mg via INTRAVENOUS

## 2020-11-12 MED ORDER — ROCURONIUM BROMIDE 10 MG/ML (PF) SYRINGE
PREFILLED_SYRINGE | INTRAVENOUS | Status: AC
  Filled 2020-11-12: qty 10

## 2020-11-12 MED ORDER — CHLORHEXIDINE GLUCONATE 0.12 % MT SOLN
15.0000 mL | Freq: Once | OROMUCOSAL | Status: AC
  Administered 2020-11-12: 15 mL via OROMUCOSAL

## 2020-11-12 MED ORDER — BACITRACIN ZINC 500 UNIT/GM EX OINT
TOPICAL_OINTMENT | CUTANEOUS | Status: AC
  Filled 2020-11-12: qty 28.35

## 2020-11-12 MED ORDER — 0.9 % SODIUM CHLORIDE (POUR BTL) OPTIME
TOPICAL | Status: DC | PRN
  Administered 2020-11-12: 1000 mL

## 2020-11-12 MED ORDER — MAGNESIUM SULFATE 2 GM/50ML IV SOLN
2.0000 g | Freq: Once | INTRAVENOUS | Status: AC
  Administered 2020-11-12: 2 g via INTRAVENOUS
  Filled 2020-11-12: qty 50

## 2020-11-12 MED ORDER — MEPERIDINE HCL 50 MG/ML IJ SOLN: 6.2500 mg | INTRAMUSCULAR | Status: DC | PRN

## 2020-11-12 MED ORDER — OXYMETAZOLINE HCL 0.05 % NA SOLN
NASAL | Status: AC
  Filled 2020-11-12: qty 30

## 2020-11-12 MED ORDER — MIDAZOLAM HCL 2 MG/2ML IJ SOLN
INTRAMUSCULAR | Status: AC
  Filled 2020-11-12: qty 2

## 2020-11-12 MED ORDER — CHLORHEXIDINE GLUCONATE CLOTH 2 % EX PADS
6.0000 | MEDICATED_PAD | Freq: Every day | CUTANEOUS | Status: DC
  Administered 2020-11-13: 6 via TOPICAL

## 2020-11-12 MED ORDER — KETOROLAC TROMETHAMINE 15 MG/ML IJ SOLN
15.0000 mg | Freq: Once | INTRAMUSCULAR | Status: AC
  Administered 2020-11-12: 15 mg via INTRAVENOUS

## 2020-11-12 MED ORDER — PROPOFOL 10 MG/ML IV BOLUS
INTRAVENOUS | Status: AC
  Filled 2020-11-12: qty 20

## 2020-11-12 MED ORDER — ONDANSETRON HCL 4 MG/2ML IJ SOLN
INTRAMUSCULAR | Status: DC | PRN
  Administered 2020-11-12: 4 mg via INTRAVENOUS

## 2020-11-12 MED ORDER — FENTANYL CITRATE (PF) 100 MCG/2ML IJ SOLN: 25.0000 ug | INTRAMUSCULAR | Status: DC | PRN

## 2020-11-12 MED ORDER — LACTATED RINGERS IV SOLN: INTRAVENOUS | Status: DC

## 2020-11-12 MED ORDER — PROMETHAZINE HCL 25 MG/ML IJ SOLN: 6.2500 mg | INTRAMUSCULAR | Status: DC | PRN

## 2020-11-12 MED ORDER — MIDAZOLAM HCL 5 MG/5ML IJ SOLN
INTRAMUSCULAR | Status: DC | PRN
  Administered 2020-11-12: 2 mg via INTRAVENOUS

## 2020-11-12 MED ORDER — LIDOCAINE 2% (20 MG/ML) 5 ML SYRINGE
INTRAMUSCULAR | Status: DC | PRN
  Administered 2020-11-12: 100 mg via INTRAVENOUS

## 2020-11-12 MED ORDER — FENTANYL CITRATE (PF) 250 MCG/5ML IJ SOLN
INTRAMUSCULAR | Status: DC | PRN
  Administered 2020-11-12: 100 ug via INTRAVENOUS

## 2020-11-12 MED ORDER — DEXAMETHASONE SODIUM PHOSPHATE 10 MG/ML IJ SOLN
INTRAMUSCULAR | Status: DC | PRN
  Administered 2020-11-12: 8 mg via INTRAVENOUS

## 2020-11-12 MED ORDER — MUPIROCIN 2 % EX OINT
1.0000 "application " | TOPICAL_OINTMENT | Freq: Two times a day (BID) | CUTANEOUS | Status: DC
  Administered 2020-11-12 – 2020-11-13 (×2): 1 via NASAL
  Filled 2020-11-12: qty 22

## 2020-11-12 MED ORDER — DEXAMETHASONE SODIUM PHOSPHATE 10 MG/ML IJ SOLN
INTRAMUSCULAR | Status: AC
  Filled 2020-11-12: qty 1

## 2020-11-12 MED ORDER — LIDOCAINE-EPINEPHRINE 2 %-1:100000 IJ SOLN
INTRAMUSCULAR | Status: AC
  Filled 2020-11-12: qty 1.7

## 2020-11-12 MED ORDER — BUPIVACAINE-EPINEPHRINE (PF) 0.5% -1:200000 IJ SOLN
INTRAMUSCULAR | Status: AC
  Filled 2020-11-12: qty 1.8

## 2020-11-12 SURGICAL SUPPLY — 53 items
ATTRACTOMAT 16X20 MAGNETIC DRP (DRAPES) ×2 IMPLANT
BAG SPEC THK2 15X12 ZIP CLS (MISCELLANEOUS) ×1
BAG ZIPLOCK 12X15 (MISCELLANEOUS) ×2 IMPLANT
BLADE SURG 15 STRL LF DISP TIS (BLADE) ×2 IMPLANT
BLADE SURG 15 STRL SS (BLADE) ×4
BNDG EYE OVAL (GAUZE/BANDAGES/DRESSINGS) ×4 IMPLANT
BNDG GAUZE ELAST 4 BULKY (GAUZE/BANDAGES/DRESSINGS) IMPLANT
CANNULA VESSEL W/WING WO/VALVE (CANNULA) ×2 IMPLANT
CLIP VESOCCLUDE SM WIDE 6/CT (CLIP) IMPLANT
COVER BACK TABLE 60X90IN (DRAPES) ×2 IMPLANT
COVER SURGICAL LIGHT HANDLE (MISCELLANEOUS) ×2 IMPLANT
COVER WAND RF STERILE (DRAPES) IMPLANT
DECANTER SPIKE VIAL GLASS SM (MISCELLANEOUS) IMPLANT
DRAIN PENROSE 0.5X18 (DRAIN) IMPLANT
DRSG PAD ABDOMINAL 8X10 ST (GAUZE/BANDAGES/DRESSINGS) IMPLANT
ELECT COATED BLADE 2.86 ST (ELECTRODE) ×2 IMPLANT
ELECT REM PT RETURN 15FT ADLT (MISCELLANEOUS) ×2 IMPLANT
GAUZE 4X4 16PLY RFD (DISPOSABLE) ×2 IMPLANT
GAUZE SPONGE 4X4 12PLY STRL (GAUZE/BANDAGES/DRESSINGS) IMPLANT
GLOVE BIOGEL M 8.0 STRL (GLOVE) ×2 IMPLANT
GOWN STRL REUS W/TWL XL LVL3 (GOWN DISPOSABLE) ×4 IMPLANT
KIT BASIN OR (CUSTOM PROCEDURE TRAY) ×2 IMPLANT
KIT TURNOVER KIT A (KITS) ×2 IMPLANT
NDL DENTAL RB 25GX1.25 (NEEDLE) ×2 IMPLANT
NEEDLE DENTAL RB 25GX1.25 (NEEDLE) ×4 IMPLANT
NEEDLE HYPO 22GX1.5 SAFETY (NEEDLE) IMPLANT
NS IRRIG 1000ML POUR BTL (IV SOLUTION) ×2 IMPLANT
PACK EENT SPLIT (PACKS) ×2 IMPLANT
PACK LITHOTOMY IV (CUSTOM PROCEDURE TRAY) ×2 IMPLANT
PACKING VAGINAL (PACKING) ×2 IMPLANT
PENCIL SMOKE EVACUATOR (MISCELLANEOUS) IMPLANT
SOL PREP POV-IOD 4OZ 10% (MISCELLANEOUS) ×2 IMPLANT
SOL PREP PROV IODINE SCRUB 4OZ (MISCELLANEOUS) ×2 IMPLANT
SPONGE LAP 18X18 RF (DISPOSABLE) ×2 IMPLANT
SUT CHROMIC 3 0 PS 2 (SUTURE) ×6 IMPLANT
SUT ETHILON 3 0 PS 1 (SUTURE) IMPLANT
SUT VIC AB 2-0 PS2 27 (SUTURE) ×2 IMPLANT
SUT VIC AB 3-0 PS2 18 (SUTURE) ×4
SUT VIC AB 3-0 PS2 18XBRD (SUTURE) ×2 IMPLANT
SUT VIC AB 3-0 SH 27 (SUTURE)
SUT VIC AB 3-0 SH 27XBRD (SUTURE) IMPLANT
SUT VIC AB 4-0 PS2 27 (SUTURE) ×4 IMPLANT
SWAB COLLECTION DEVICE MRSA (MISCELLANEOUS) IMPLANT
SWAB CULTURE ESWAB REG 1ML (MISCELLANEOUS) IMPLANT
SYR 50ML LL SCALE MARK (SYRINGE) ×2 IMPLANT
SYR BULB IRRIG 60ML STRL (SYRINGE) ×2 IMPLANT
SYR CONTROL 10ML LL (SYRINGE) IMPLANT
TOOTHBRUSH ADULT (PERSONAL CARE ITEMS) ×2 IMPLANT
TOWEL OR 17X26 10 PK STRL BLUE (TOWEL DISPOSABLE) ×2 IMPLANT
TOWEL OR NON WOVEN STRL DISP B (DISPOSABLE) ×2 IMPLANT
TRAY FOLEY MTR SLVR 16FR STAT (SET/KITS/TRAYS/PACK) ×2 IMPLANT
WATER STERILE IRR 1000ML POUR (IV SOLUTION) ×2 IMPLANT
YANKAUER SUCT BULB TIP NO VENT (SUCTIONS) ×2 IMPLANT

## 2020-11-12 NOTE — Anesthesia Preprocedure Evaluation (Signed)
Anesthesia Evaluation  Patient identified by MRN, date of birth, ID band Patient awake    Reviewed: Allergy & Precautions, NPO status , Patient's Chart, lab work & pertinent test results  History of Anesthesia Complications (+) PONV and history of anesthetic complications  Airway Mallampati: I       Dental  (+) Poor Dentition   Pulmonary neg pulmonary ROS,    Pulmonary exam normal        Cardiovascular negative cardio ROS Normal cardiovascular exam     Neuro/Psych    GI/Hepatic Neg liver ROS,   Endo/Other  diabetes, Type 2, Oral Hypoglycemic Agents  Renal/GU negative Renal ROS  negative genitourinary   Musculoskeletal negative musculoskeletal ROS (+)   Abdominal (+) + obese,   Peds  Hematology negative hematology ROS (+)   Anesthesia Other Findings   Reproductive/Obstetrics                             Anesthesia Physical Anesthesia Plan  ASA: II  Anesthesia Plan: General   Post-op Pain Management:    Induction: Intravenous  PONV Risk Score and Plan: Ondansetron, Dexamethasone and Midazolam  Airway Management Planned: Nasal ETT  Additional Equipment: None  Intra-op Plan:   Post-operative Plan: Extubation in OR  Informed Consent: I have reviewed the patients History and Physical, chart, labs and discussed the procedure including the risks, benefits and alternatives for the proposed anesthesia with the patient or authorized representative who has indicated his/her understanding and acceptance.     Dental advisory given  Plan Discussed with: CRNA  Anesthesia Plan Comments:         Anesthesia Quick Evaluation

## 2020-11-12 NOTE — H&P (Signed)
H&P documentation  -History and Physical Reviewed  -Patient has been re-examined  -No change in the plan of care  Karen Roberts  

## 2020-11-12 NOTE — Transfer of Care (Signed)
Immediate Anesthesia Transfer of Care Note  Patient: Karen Roberts  Procedure(s) Performed: DENTAL RESTORATION/EXTRACTIONS (N/A ) INCISION AND DRAINAGE ABSCESS (N/A )  Patient Location: PACU  Anesthesia Type:General  Level of Consciousness: drowsy  Airway & Oxygen Therapy: Patient Spontanous Breathing and Patient connected to face mask oxygen  Post-op Assessment: Report given to RN and Post -op Vital signs reviewed and stable  Post vital signs: Reviewed and stable  Last Vitals:  Vitals Value Taken Time  BP 127/72 11/12/20 1640  Temp 36.5 C 11/12/20 1640  Pulse 89 11/12/20 1644  Resp 18 11/12/20 1644  SpO2 99 % 11/12/20 1644  Vitals shown include unvalidated device data.  Last Pain:  Vitals:   11/12/20 1640  TempSrc:   PainSc: Asleep      Patients Stated Pain Goal: 2 (34/19/37 9024)  Complications: No complications documented.

## 2020-11-12 NOTE — H&P (Signed)
Anesthesia H&P Update: History and Physical Exam reviewed; patient is OK for planned anesthetic and procedure. ? ?

## 2020-11-12 NOTE — Op Note (Signed)
11/11/2020 - 11/12/2020  4:26 PM  PATIENT:  Karen Roberts  50 y.o. female  PRE-OPERATIVE DIAGNOSIS:  Abscessed teeth left mandible # 17, 18, 19, Left facial cellulitis  POST-OPERATIVE DIAGNOSIS:  SAME  PROCEDURE:  Procedure(s): DENTAL EXTRACTION teeth 17, 18, 19, INCISION AND DRAINAGE ABSCESS left buccal vestibule mandible  SURGEON:  Surgeon(s): Diona Browner, DMD  ANESTHESIA:   local and general  EBL:  minimal  DRAINS: none   SPECIMEN:  No Specimen  COUNTS:  YES  PLAN OF CARE: Discharge to home after PACU  PATIENT DISPOSITION: To PACU to floor   PROCEDURE DETAILS: Dictation #[id number]  Gae Bon, DMD 11/12/2020 4:26 PM               [id number]

## 2020-11-12 NOTE — Progress Notes (Signed)
PROGRESS NOTE  Karen Roberts HER:[id number] DOB: 1970-12-24   PCP: Patient, No Pcp Per (Inactive)  Patient is from: Home  DOA: 11/11/2020 LOS: 1  Chief complaints: Left face swelling  Brief Narrative / Interim history: 50 year old F with PMH of DM-2, hepatic steatosis and anxiety presenting with facial swelling, and admitted for left facial and neck cellulitis of odontogenic origin.  Maxillofacial CT concerning for cellulitis, phlegmon and dental caries.  Patient was started on amoxicillin and clindamycin and admitted.  The next day, patient was continued on IV Unasyn.  Oral surgery, Dr. Hoyt Koch consulted.  Patient underwent incision and drainage of left buccal vestibule mandible abscess and dental extraction (#17, 18 and 19).  Subjective: Seen and examined earlier this morning before she went for surgery.  Reports improvement in her pain and swelling.  Denies chest pain, dyspnea, GI or UTI symptoms.  Objective: Vitals:   11/12/20 1309 11/12/20 1640 11/12/20 1645 11/12/20 1700  BP: 138/82 127/72 119/77 136/87  Pulse: 85 96 89 88  Resp: 18 13 18 20   Temp: 98.8 F (37.1 C) 97.7 F (36.5 C)    TempSrc: Oral     SpO2: 100% 99% 99% 98%  Weight:      Height:        Intake/Output Summary (Last 24 hours) at 11/12/2020 1710 Last data filed at 11/12/2020 1645 Gross per 24 hour  Intake 2662.75 ml  Output 1400 ml  Net 1262.75 ml   Filed Weights   11/10/20 2205  Weight: 90.7 kg    Examination:   GENERAL: No apparent distress.  Nontoxic. HEENT: MMM.  Vision and hearing grossly intact.  Swelling and tenderness over left mandibular area NECK: Supple.  Swelling and tenderness to the left.  Fair range of motion RESP: On RA.  No IWOB.  Fair aeration bilaterally. CVS:  RRR. Heart sounds normal.  ABD/GI/GU: BS+. Abd soft, NTND.  MSK/EXT:  Moves extremities. No apparent deformity. No edema.  SKIN: no apparent skin lesion or wound NEURO: Awake, alert and oriented appropriately.  No  apparent focal neuro deficit. PSYCH: Calm. Normal affect.  Procedures:  4/14-incision and drainage of abscess in left buccal vestibule mandible and dental extraction of teeth numbers 17, 18 and 19  Microbiology summarized: COVID-19 and influenza PCR nonreactive.  Assessment & Plan: Left facial cellulitis with abscess-maxillofacial CT with cellulitis and focal phlegmonous change next to left mandible without discrete abscess or drainable fluid collection.  She has notable dental caries on the left. -Status post I&D and dental extraction as above -Continue antibiotics with IV Unasyn -Continue IV fluid hydration  Uncontrolled DM-2 with hyperglycemia: A1c 12.4%.  Only on Metformin at home. Recent Labs  Lab 11/11/20 2046 11/12/20 0729 11/12/20 1136 11/12/20 1504 11/12/20 1647  GLUCAP 164* 165* 120* 129* 154*  -Continue SSI-moderate -Continue mealtime coverage at 4 units 3 times daily -Continue Lantus 6 units twice daily -Check a.m. lipid panel -Continue home lisinopril -Statin once LFT resolves  Elevated liver enzymes: Due to alcohol?  She denies alcohol in the last 1 week.  CK within normal.  No significant tenderness over RUQ. -Continue monitoring -Check acute hepatitis panel in the morning  Hypokalemia/hypomagnesemia: Hypokalemia resolved.  Mg 1.6. -IV magnesium sulfate 2 g x 1  Essential hypertension: Improved. -Continue home lisinopril   Class I obesity Body mass index is 33.28 kg/m.         DVT prophylaxis:  SCD's Start: 11/12/20 1459 SCDs Start: 11/11/20 0402  Code Status: Full code Family Communication: Patient  and/or RN. Available if any question.  Level of care: Med-Surg Status is: Inpatient  Remains inpatient appropriate because:Persistent severe electrolyte disturbances, Ongoing active pain requiring inpatient pain management, IV treatments appropriate due to intensity of illness or inability to take PO and Inpatient level of care appropriate due to  severity of illness   Dispo: The patient is from: Home              Anticipated d/c is to: Home              Patient currently is not medically stable to d/c.   Difficult to place patient No       Consultants:  Oral surgery   Sch Meds:  Scheduled Meds: . [MAR Hold] acidophilus  2 capsule Oral Daily  . [MAR Hold] cholecalciferol  1,000 Units Oral Daily  . fentaNYL      . [MAR Hold] insulin aspart  0-15 Units Subcutaneous TID WC  . [MAR Hold] insulin aspart  0-5 Units Subcutaneous QHS  . [MAR Hold] insulin aspart  0-5 Units Subcutaneous QHS  . [MAR Hold] insulin aspart  4 Units Subcutaneous TID WC  . [MAR Hold] insulin glargine  6 Units Subcutaneous BID  . [MAR Hold] lisinopril  5 mg Oral Daily  . oxymetazoline       Continuous Infusions: . sodium chloride 75 mL/hr at 11/12/20 1016  . [MAR Hold] ampicillin-sulbactam (UNASYN) IV 3 g (11/12/20 1327)  . lactated ringers Stopped (11/12/20 1645)   PRN Meds:.0.9 % irrigation (POUR BTL), [MAR Hold] acetaminophen **OR** [MAR Hold] acetaminophen, [MAR Hold] bisacodyl, fentaNYL (SUBLIMAZE) injection, [MAR Hold]  HYDROmorphone (DILAUDID) injection, [MAR Hold] ketorolac, lidocaine-EPINEPHrine, [MAR Hold] magnesium citrate, meperidine (DEMEROL) injection, [MAR Hold] ondansetron **OR** [MAR Hold] ondansetron (ZOFRAN) IV, [MAR Hold] polyvinyl alcohol, promethazine, [MAR Hold] senna-docusate, [MAR Hold] traZODone  Antimicrobials: Anti-infectives (From admission, onward)   Start     Dose/Rate Route Frequency Ordered Stop   11/11/20 0800  [MAR Hold]  Ampicillin-Sulbactam (UNASYN) 3 g in sodium chloride 0.9 % 100 mL IVPB        (MAR Hold since Thu 11/12/2020 at 1453.Hold Reason: Transfer to a Procedural area.)   3 g 200 mL/hr over 30 Minutes Intravenous Every 6 hours 11/11/20 0734     11/11/20 0245  clindamycin (CLEOCIN) IVPB 600 mg        600 mg 100 mL/hr over 30 Minutes Intravenous  Once 11/11/20 0244 11/11/20 0323   11/10/20 2230   amoxicillin (AMOXIL) capsule 500 mg        500 mg Oral  Once 11/10/20 2227 11/11/20 0128       I have personally reviewed the following labs and images: CBC: Recent Labs  Lab 11/10/20 2236 11/11/20 0426 11/12/20 0412  WBC 8.2 7.5 10.1  NEUTROABS 5.0  --   --   HGB 13.7 14.5 13.4  HCT 40.0 43.7 41.3  MCV 87.7 92.0 91.8  PLT 294 254 302   BMP &GFR Recent Labs  Lab 11/10/20 2236 11/11/20 0426 11/12/20 0412  NA 137 137 134*  K 3.4* 3.4* 4.1  CL 103 99 103  CO2 26 28 22   GLUCOSE 275* 313* 208*  BUN 15 14 9   CREATININE 0.90 0.81 0.76  CALCIUM 9.4 9.3 8.8*  MG  --  1.9 1.6*  PHOS  --   --  2.7   Estimated Creatinine Clearance: 92.6 mL/min (by C-G formula based on SCr of 0.76 mg/dL). Liver & Pancreas: Recent Labs  Lab 11/11/20  8315 11/12/20 0412  AST 138* 112*  ALT 81* 176*  ALKPHOS 155* 147*  BILITOT 0.5 0.9  PROT 7.3 6.0*  ALBUMIN 3.8 3.2*   No results for input(s): LIPASE, AMYLASE in the last 168 hours. No results for input(s): AMMONIA in the last 168 hours. Diabetic: Recent Labs    11/11/20 0426  HGBA1C 12.4*   Recent Labs  Lab 11/11/20 2046 11/12/20 0729 11/12/20 1136 11/12/20 1504 11/12/20 1647  GLUCAP 164* 165* 120* 129* 154*   Cardiac Enzymes: Recent Labs  Lab 11/11/20 0426  CKTOTAL 83   No results for input(s): PROBNP in the last 8760 hours. Coagulation Profile: No results for input(s): INR, PROTIME in the last 168 hours. Thyroid Function Tests: No results for input(s): TSH, T4TOTAL, FREET4, T3FREE, THYROIDAB in the last 72 hours. Lipid Profile: No results for input(s): CHOL, HDL, LDLCALC, TRIG, CHOLHDL, LDLDIRECT in the last 72 hours. Anemia Panel: No results for input(s): VITAMINB12, FOLATE, FERRITIN, TIBC, IRON, RETICCTPCT in the last 72 hours. Urine analysis:    Component Value Date/Time   COLORURINE STRAW (A) 08/28/2020 1615   APPEARANCEUR CLEAR 08/28/2020 1615   LABSPEC 1.026 08/28/2020 1615   PHURINE 5.0 08/28/2020 1615    GLUCOSEU >=500 (A) 08/28/2020 1615   HGBUR SMALL (A) 08/28/2020 1615   BILIRUBINUR NEGATIVE 08/28/2020 [place] 08/28/2020 1615   PROTEINUR NEGATIVE 08/28/2020 1615   UROBILINOGEN 0.2 02/07/2020 1901   NITRITE NEGATIVE 08/28/2020 1615   LEUKOCYTESUR NEGATIVE 08/28/2020 1615   Sepsis Labs: Invalid input(s): PROCALCITONIN, [place]  Microbiology: Recent Results (from the past 240 hour(s))  Resp Panel by RT-PCR (Flu A&B, Covid) Nasopharyngeal Swab     Status: None   Collection Time: 11/11/20  2:45 AM   Specimen: Nasopharyngeal Swab; Nasopharyngeal(NP) swabs in vial transport medium  Result Value Ref Range Status   SARS Coronavirus 2 by RT PCR NEGATIVE NEGATIVE Final    Comment: (NOTE) SARS-CoV-2 target nucleic acids are NOT DETECTED.  The SARS-CoV-2 RNA is generally detectable in upper respiratory specimens during the acute phase of infection. The lowest concentration of SARS-CoV-2 viral copies this assay can detect is 138 copies/mL. A negative result does not preclude SARS-Cov-2 infection and should not be used as the sole basis for treatment or other patient management decisions. A negative result may occur with  improper specimen collection/handling, submission of specimen other than nasopharyngeal swab, presence of viral mutation(s) within the areas targeted by this assay, and inadequate number of viral copies(<138 copies/mL). A negative result must be combined with clinical observations, patient history, and epidemiological information. The expected result is Negative.  Fact Sheet for Patients:  EntrepreneurPulse.com.au  Fact Sheet for Healthcare Providers:  IncredibleEmployment.be  This test is no t yet approved or cleared by the [place] FDA and  has been authorized for detection and/or diagnosis of SARS-CoV-2 by FDA under an Emergency Use Authorization (EUA). This EUA will remain  in effect (meaning this  test can be used) for the duration of the COVID-19 declaration under Section 564(b)(1) of the Act, 21 U.S.C.section 360bbb-3(b)(1), unless the authorization is terminated  or revoked sooner.       Influenza A by PCR NEGATIVE NEGATIVE Final   Influenza B by PCR NEGATIVE NEGATIVE Final    Comment: (NOTE) The Xpert Xpress SARS-CoV-2/FLU/RSV plus assay is intended as an aid in the diagnosis of influenza from Nasopharyngeal swab specimens and should not be used as a sole basis for treatment. Nasal washings and aspirates are unacceptable for  Xpert Xpress SARS-CoV-2/FLU/RSV testing.  Fact Sheet for Patients: EntrepreneurPulse.com.au  Fact Sheet for Healthcare Providers: IncredibleEmployment.be  This test is not yet approved or cleared by the [place] FDA and has been authorized for detection and/or diagnosis of SARS-CoV-2 by FDA under an Emergency Use Authorization (EUA). This EUA will remain in effect (meaning this test can be used) for the duration of the COVID-19 declaration under Section 564(b)(1) of the Act, 21 U.S.C. section 360bbb-3(b)(1), unless the authorization is terminated or revoked.  Performed at [facility], [place] [address] [place], [place] [address]   Surgical pcr screen     Status: Abnormal   Collection Time: 11/11/20 11:00 PM   Specimen: Nasal Mucosa; Nasal Swab  Result Value Ref Range Status   MRSA, PCR NEGATIVE NEGATIVE Final   Staphylococcus aureus POSITIVE (A) NEGATIVE Final    Comment: (NOTE) The Xpert SA Assay (FDA approved for NASAL specimens in patients 26 years of age and older), is one component of a comprehensive surveillance program. It is not intended to diagnose infection nor to guide or monitor treatment. Performed at [facility], [place] [address], [place], [place] [address]     Radiology Studies: No results found.   Zorana Brockwell T. [place]  If  7PM-7AM, please contact night-coverage [email or web address] 11/12/2020, 5:10 PM is

## 2020-11-12 NOTE — Anesthesia Procedure Notes (Signed)
Procedure Name: Intubation Performed by: Milford Cage, CRNA Pre-anesthesia Checklist: Patient identified, Emergency Drugs available, Suction available and Patient being monitored Patient Re-evaluated:Patient Re-evaluated prior to induction Oxygen Delivery Method: Circle System Utilized Preoxygenation: Pre-oxygenation with 100% oxygen Induction Type: IV induction Ventilation: Mask ventilation without difficulty Laryngoscope Size: Glidescope and 3 Grade View: Grade I Tube type: Oral Nasal Tubes: Magill forceps- large, utilized Number of attempts: 1 Airway Equipment and Method: Video-laryngoscopy Placement Confirmation: ETT inserted through vocal cords under direct vision,  positive ETCO2 and breath sounds checked- equal and bilateral Secured at: 27 cm Tube secured with: Tape Dental Injury: Teeth and Oropharynx as per pre-operative assessment  Comments: Elective glidescope nasal intubation

## 2020-11-13 ENCOUNTER — Encounter (HOSPITAL_COMMUNITY): Payer: Self-pay | Admitting: Oral Surgery

## 2020-11-13 LAB — COMPREHENSIVE METABOLIC PANEL
ALT: 175 U/L — ABNORMAL HIGH (ref 0–44)
AST: 82 U/L — ABNORMAL HIGH (ref 15–41)
Albumin: 3.1 g/dL — ABNORMAL LOW (ref 3.5–5.0)
Alkaline Phosphatase: 163 U/L — ABNORMAL HIGH (ref 38–126)
Anion gap: 7 (ref 5–15)
BUN: 11 mg/dL (ref 6–20)
CO2: 24 mmol/L (ref 22–32)
Calcium: 8.6 mg/dL — ABNORMAL LOW (ref 8.9–10.3)
Chloride: 102 mmol/L (ref 98–111)
Creatinine, Ser: 0.92 mg/dL (ref 0.44–1.00)
GFR, Estimated: >60 mL/min (ref >60)
Glucose, Bld: 356 mg/dL — ABNORMAL HIGH (ref 70–99)
Potassium: 4 mmol/L (ref 3.5–5.1)
Sodium: 133 mmol/L — ABNORMAL LOW (ref 135–145)
Total Bilirubin: 0.6 mg/dL (ref 0.3–1.2)
Total Protein: 6.2 g/dL — ABNORMAL LOW (ref 6.5–8.1)

## 2020-11-13 LAB — CBC
HCT: 39.2 % (ref 36.0–46.0)
Hemoglobin: 13 g/dL (ref 12.0–15.0)
MCH: 30.3 pg (ref 26.0–34.0)
MCHC: 33.2 g/dL (ref 30.0–36.0)
MCV: 91.4 fL (ref 80.0–100.0)
Platelets: 324 10*3/uL (ref 150–400)
RBC: 4.29 MIL/uL (ref 3.87–5.11)
RDW: 12.4 % (ref 11.5–15.5)
WBC: 7 10*3/uL (ref 4.0–10.5)
nRBC: 0 % (ref 0.0–0.2)

## 2020-11-13 LAB — GLUCOSE, CAPILLARY
Glucose-Capillary: 220 mg/dL — ABNORMAL HIGH (ref 70–99)
Glucose-Capillary: 300 mg/dL — ABNORMAL HIGH (ref 70–99)

## 2020-11-13 LAB — HEPATITIS PANEL, ACUTE
HCV Ab: NONREACTIVE
Hep A IgM: NONREACTIVE
Hep B C IgM: NONREACTIVE
Hepatitis B Surface Ag: NONREACTIVE

## 2020-11-13 LAB — CK: Total CK: 44 U/L (ref 38–234)

## 2020-11-13 LAB — PHOSPHORUS: Phosphorus: 3.1 mg/dL (ref 2.5–4.6)

## 2020-11-13 LAB — MAGNESIUM: Magnesium: 2 mg/dL (ref 1.7–2.4)

## 2020-11-13 MED ORDER — NAPROXEN 500 MG PO TBEC: 500.0000 mg | DELAYED_RELEASE_TABLET | Freq: Two times a day (BID) | ORAL | 0 refills | Status: AC

## 2020-11-13 MED ORDER — METFORMIN HCL 500 MG PO TABS: ORAL_TABLET | ORAL | 0 refills | Status: DC

## 2020-11-13 MED ORDER — INSULIN ASPART PROT & ASPART (70-30 MIX) 100 UNIT/ML PEN: 20.0000 [IU] | PEN_INJECTOR | Freq: Two times a day (BID) | SUBCUTANEOUS | 1 refills | Status: DC

## 2020-11-13 MED ORDER — LISINOPRIL 5 MG PO TABS: 5.0000 mg | ORAL_TABLET | Freq: Every day | ORAL | 0 refills | Status: DC

## 2020-11-13 MED ORDER — AMOXICILLIN-POT CLAVULANATE 875-125 MG PO TABS: 1.0000 | ORAL_TABLET | Freq: Two times a day (BID) | ORAL | 0 refills | Status: AC

## 2020-11-13 MED ORDER — PEN NEEDLES 30G X 5 MM MISC: 1.0000 "pen " | Freq: Two times a day (BID) | 1 refills | Status: DC

## 2020-11-13 NOTE — Op Note (Signed)
NAME: Karen Roberts, [place] MEDICAL RECORD NO: [id number] ACCOUNT NO: [id number] DATE OF BIRTH: [birth year] FACILITY: [facility] LOCATION: [id number] PHYSICIAN: Gae Bon, DDS  Operative Report   DATE OF PROCEDURE: 11/11/2020  PREOPERATIVE DIAGNOSES:  Abscessed teeth, left mandible 17, 18, 19 and left facial cellulitis.  POSTOPERATIVE DIAGNOSES:  Abscessed teeth, left mandible 17, 18, 19 and left facial cellulitis.  PROCEDURE:  Extraction teeth numbers 17, 18, 19 and incision and drainage of abscess, left buccal vestibule of the mandible.  SURGEON:  Gae Bon, DDS  ANESTHESIA:  General via nasal intubation, the attending was [facility]  DESCRIPTION OF PROCEDURE:  The patient was taken to the operating room and placed on the table in supine position.  General anesthesia was administered and a nasal endotracheal tube was placed and secured.  The eyes were protected and the patient was  draped for surgery.  A timeout was performed.  The posterior pharynx was suctioned and a throat pack was placed.  2% lidocaine with 1:100,000 epinephrine was infiltrated in an inferior alveolar block on the left side and in buccal infiltration around the  teeth 17, 18, 19, and in the buccal vestibule where there was some edema.  A bite block was placed on the right side of the mouth and a sweetheart retractor was used to retract the tongue.  A #15 blade was used to make an incision in the buccal  vestibule where there was some edema.  There was, however, no frank purulent material expressed.  A hemostat and periosteal elevator were inserted into the incision site and dissected away from the mandible, but no loculations were found.  Then, a 15  blade was used to make an incision around teeth numbers 17, 18, 19 and the gingival sulcus.  The periosteum was reflected from around these teeth.  Attempts were made to elevate and remove teeth number 17 and 18, but they were basically immobile.  Then,  the [employer]  handpiece was used under irrigation and using a fissure bur to create a trough in the buccal bone around 17 and 18,  the teeth were then elevated and removed from the mouth with a dental forceps.  Tooth number 19 was removed with the forceps.   Then, the sockets were curetted.  There was granulation and phlegmonous material, which was debrided from the inferior aspect of the socket.  Then, the sockets were thoroughly curetted, irrigated with saline and then closed with 3-0 interrupted sutures  x2.  The oral cavity was then irrigated and suctioned.  The throat pack was removed.  The patient was left under the care of anesthesia for extubation, transported to recovery room with plans for discharge to the floor for further treatment of IV  antibiotics.    ESTIMATED BLOOD LOSS:  Minimal.  COMPLICATIONS:  None.  SPECIMENS:  None.  DRAINS:  There  were no drains placed.   PAA D: 11/12/2020 4:31:25 pm T: 11/13/2020 6:57:00 am  JOB: [id number]/ [id number]

## 2020-11-13 NOTE — TOC Transition Note (Signed)
Transition of Care [facility]) - CM/SW Discharge Note   Patient Details  Name: Karen Roberts MRN: [id number] Date of Birth: 1970-12-13  Transition of Care [facility]) CM/SW Contact:  Lennart Pall, LCSW Phone Number: 11/13/2020, 1:29 PM   Clinical Narrative:     Met with pt and reviewed referral for TOC to assist with establishing PCP and  medication assistance.  Pt very pleasant and agreeable to assist.  Have been able to secure a new patient appointment at [facility] on June 1st (earliest available).  Have also placed pt into the [facility] program for assist with filling of prescriptions today at [facility].  Pt and husband understand and appreciative.  No further TOC needs.  Final next level of care: Home/Self Care Barriers to Discharge: Barriers Resolved   Patient Goals and CMS Choice Patient states their goals for this hospitalization and ongoing recovery are:: return home      Discharge Placement                       Discharge Plan and Services                DME Arranged: N/A DME Agency: NA                  Social Determinants of Health (SDOH) Interventions     Readmission Risk Interventions Readmission Risk Prevention Plan 11/13/2020  Post Dischage Appt Complete  Medication Screening Complete  Transportation Screening Complete  Some recent data might be hidden

## 2020-11-13 NOTE — Progress Notes (Signed)
Discharge instructions given to patient and all questions were answered.  

## 2020-11-13 NOTE — Discharge Summary (Signed)
Physician Discharge Summary  Adelina Collard TGG:[id number] DOB: 1971/05/15 DOA: 11/11/2020  PCP: Patient, No Pcp Per (Inactive)  Admit date: 11/11/2020 Discharge date: 11/13/2020  Admitted From: Home Disposition: Home  Recommendations for Outpatient Follow-up:  1. Follow ups as below. 2. Please obtain CBC/BMP/Mag at follow up 3. Please follow up on the following pending results: None  Home Health: None required Equipment/Devices: None required  Discharge Condition: Stable CODE STATUS: Full code   Follow-up Information    Diona Browner, DMD. Schedule an appointment as soon as possible for a visit in 1 week(s).   Specialty: Oral Surgery Contact information: [place] [address] [phone]               Hospital Course: 50 year old F with PMH of DM-2, hepatic steatosis and anxiety presenting with facial swelling, and admitted for left facial and neck cellulitis of odontogenic origin.  Maxillofacial CT concerning for cellulitis, phlegmon and dental caries.  Patient was started on amoxicillin and clindamycin and admitted.  The next day, patient was continued on IV Unasyn.  Oral surgery, Dr. Hoyt Koch consulted.  Patient underwent incision and drainage of left buccal vestibule mandible abscess and dental extraction (#17, 18 and 19) on 11/12/2020.   On the day of discharge, swelling and pain improved.  Tolerated regular diet.  She felt well and ready to go home.  Oral surgery recommended follow-up in 1 week.   In regards to her diabetes, A1c 12.4%.  Restarted on metformin and added 70/30 insulin at 20 units twice daily.  I have not started a statin due to elevated liver enzymes.  Encouraged to have an eye exam and establish care with local PCP.  Counseled on foot care.  See individual problem list below for more on hospital course.  Discharge Diagnoses:  Left facial cellulitis with abscess-maxillofacial CT with cellulitis and focal phlegmonous change next  to left mandible without discrete abscess or drainable fluid collection.  She has notable dental caries on the left. -Status post I&D and dental extraction as above -IV Unasyn 4/13-4/15.  P.o. Augmentin for 8 more days -Instructed to brush her teeth and rinse her mouth with warm salty water 2-3 times a day -Outpatient follow-up with oral surgery in 1 week -Patient tolerated regular diet prior to discharge.  Uncontrolled DM-2 with hyperglycemia: Has some hyperglycemia after surgery likely stress-induced.  A1c 12.4%.  Does not take medication at home. -Restarted metformin -Insulin 70/30 pen at 20 units twice daily.  She was given match letter to take to pharmacy -She says she has diabetic supplies at home. -Renewed a prescription for lisinopril -Consider starting statin if LFT resolves.  Elevated liver enzymes: She denies alcohol in the last 1 week.  CK within normal.  No significant tenderness over RUQ.  Acute hepatitis panel negative.  Improving. -Recheck CMP at follow-up  Hypokalemia/hypomagnesemia:  Resolved.  Essential hypertension: Normotensive. -Continue home lisinopril   Class I obesity Body mass index is 33.28 kg/m.  -Metformin could help -Encourage lifestyle change to lose weight.          Discharge Exam: Vitals:   11/13/20 0128 11/13/20 0558  BP: 121/72 113/61  Pulse: (!) 102 79  Resp: 16 16  Temp:  98.2 F (36.8 C)  SpO2: 95% 95%    GENERAL: No apparent distress.  Nontoxic. HEENT: MMM.  S/p left lower molar teeth extraction.  Improved swelling of left face.  No erythema.  No significant tenderness.  No trismus.  Full range of motion  in her neck. NECK: Supple.  No apparent JVD.  RESP:  No IWOB.  Fair aeration bilaterally. CVS:  RRR. Heart sounds normal.  ABD/GI/GU: Bowel sounds present. Soft. Non tender.  MSK/EXT:  Moves extremities. No apparent deformity. No edema.  SKIN: no apparent skin lesion or wound NEURO: Awake, alert and oriented  appropriately.  No apparent focal neuro deficit. PSYCH: Calm. Normal affect.   Discharge Instructions  Discharge Instructions    Call MD for:  difficulty breathing, headache or visual disturbances   Complete by: As directed    Call MD for:  extreme fatigue   Complete by: As directed    Call MD for:  persistant dizziness or light-headedness   Complete by: As directed    Call MD for:  persistant nausea and vomiting   Complete by: As directed    Call MD for:  severe uncontrolled pain   Complete by: As directed    Call MD for:  temperature >100.4   Complete by: As directed    Diet - low sodium heart healthy   Complete by: As directed    Diet Carb Modified   Complete by: As directed    Discharge instructions   Complete by: As directed    It has been a pleasure taking care of you!  You were hospitalized due to left face and tooth infection.  You were treated surgically medically.  We are discharging you more antibiotics to complete treatment course.  It is very important that you take your medications as prescribed spatially your diabetic medications to have your diabetes under good control.  Poorly controlled diabetes could delay healing and increase your risk of future infection.  Please establish care with local primary care doctor for follow-up on your diabetes and other medical conditions.   In regards to your dental extraction and surgical wound, you can continue using brush with toothpaste.  You can also use warm water with salt to rinse your mouth 2-3 times a day, or after each meal.  Follow-up with your surgeon, Dr. Hoyt Koch in 1 week.     Take care,   Increase activity slowly   Complete by: As directed      Allergies as of 11/13/2020      Reactions   Ciprofloxacin Dermatitis   "dr's think it may have caused mastitis"   Morphine And Related Nausea Only   Nausea and headache   Pork-derived Products Shortness Of Breath, Nausea And Vomiting   Morphine Other (See Comments)    Headaches, itching, anger      Medication List    STOP taking these medications   ibuprofen 200 MG tablet Commonly known as: ADVIL     TAKE these medications   amoxicillin-clavulanate 875-125 MG tablet Commonly known as: Augmentin Take 1 tablet by mouth 2 (two) times daily for 8 days.   Echinacea 125 MG Caps Take 125 mg by mouth daily.   insulin aspart protamine - aspart (70-30) 100 UNIT/ML FlexPen Commonly known as: NOVOLOG 70/30 MIX Inject 0.2 mLs (20 Units total) into the skin 2 (two) times daily with a meal.   lisinopril 5 MG tablet Commonly known as: ZESTRIL Take 1 tablet (5 mg total) by mouth daily.   metFORMIN 500 MG tablet Commonly known as: GLUCOPHAGE Take 1 tablet (500 mg total) by mouth 2 (two) times daily with a meal for 15 days, THEN 2 tablets (1,000 mg total) 2 (two) times daily with a meal. Start taking on: November 13, 2020 What changed:  See the new instructions.  Another medication with the same name was removed. Continue taking this medication, and follow the directions you see here.   multivitamin with minerals Tabs tablet Take 1 tablet by mouth daily.   naproxen 500 MG EC tablet Commonly known as: EC NAPROSYN Take 1 tablet (500 mg total) by mouth 2 (two) times daily with a meal for 5 days.   Pen Needles 30G X 5 MM Misc 1 pen by Does not apply route in the morning and at bedtime.   VITAMIN C PO Take 1 tablet by mouth daily.       Consultations:  Oral surgery  Procedures/Studies:  4/14-incision and drainage of abscess in left buccal vestibule mandible and dental extraction of teeth numbers 17, 18 and 19   CT Maxillofacial W Contrast  Result Date: 11/11/2020 CLINICAL DATA:  Initial evaluation for acute left-sided facial swelling. EXAM: CT MAXILLOFACIAL WITH CONTRAST TECHNIQUE: Multidetector CT imaging of the maxillofacial structures was performed with intravenous contrast. Multiplanar CT image reconstructions were also generated.  CONTRAST:  38mL OMNIPAQUE IOHEXOL 300 MG/ML  SOLN COMPARISON:  None. FINDINGS: Osseous: No acute osseous abnormality seen about the face. 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 Multilevel degenerative spondylosis and facet arthrosis noted within the visualized upper cervical spine without high-grade spinal stenosis. No discrete or worrisome osseous lesions. Orbits: Globes and orbital soft tissues are within normal limits. Sinuses: Mild mucosal thickening noted within the right maxillary sinus. Paranasal sinuses are otherwise clear. Mastoid air cells and middle ear cavities are well pneumatized and free of fluid. Soft tissues: Asymmetric soft tissue swelling with inflammatory stranding seen involving the left face, adjacent to the left mandibular body, suspicious for acute infection/cellulitis. Changes primarily involve the left masticator and submandibular spaces. Prominent dental caries seen about the first and second mandibular molars bilaterally, as well as the right second maxillary molar, suggesting an odontogenic origin. Asymmetric soft tissue swelling along the left mandibular body suggestive of phlegmonous change (series 3, image 59). No visible discrete abscess by CT, although evaluation somewhat limited by motion and streak artifact from dental amalgam. Enlarged 1.3 cm left submandibular lymph node noted, likely reactive. Limited intracranial: Unremarkable. IMPRESSION: 1. Asymmetric soft tissue swelling with inflammatory stranding involving the left face, suspicious for acute infection/cellulitis. Focal phlegmonous change adjacent to the left mandibular body as above without definite discrete abscess or drainable fluid collection. 2. Multiple prominent dental caries as above, suggesting an odontogenic origin. 3. Enlarged 1.3 cm left submandibular lymph node, likely reactive. Electronically Signed   By: Jeannine Boga M.D.   On: 11/11/2020 02:30        The results  of significant diagnostics from this hospitalization (including imaging, microbiology, ancillary and laboratory) are listed below for reference.     Microbiology: Recent Results (from the past 240 hour(s))  Resp Panel by RT-PCR (Flu A&B, Covid) Nasopharyngeal Swab     Status: None   Collection Time: 11/11/20  2:45 AM   Specimen: Nasopharyngeal Swab; Nasopharyngeal(NP) swabs in vial transport medium  Result Value Ref Range Status   SARS Coronavirus 2 by RT PCR NEGATIVE NEGATIVE Final    Comment: (NOTE) SARS-CoV-2 target nucleic acids are NOT DETECTED.  The SARS-CoV-2 RNA is generally detectable in upper respiratory specimens during the acute phase of infection. The lowest concentration of SARS-CoV-2 viral copies this assay can detect is 138 copies/mL. A negative result does not preclude SARS-Cov-2 infection and should not be used as the sole basis for  treatment or other patient management decisions. A negative result may occur with  improper specimen collection/handling, submission of specimen other than nasopharyngeal swab, presence of viral mutation(s) within the areas targeted by this assay, and inadequate number of viral copies(<138 copies/mL). A negative result must be combined with clinical observations, patient history, and epidemiological information. The expected result is Negative.  Fact Sheet for Patients:  EntrepreneurPulse.com.au  Fact Sheet for Healthcare Providers:  IncredibleEmployment.be  This test is no t yet approved or cleared by the [place] FDA and  has been authorized for detection and/or diagnosis of SARS-CoV-2 by FDA under an Emergency Use Authorization (EUA). This EUA will remain  in effect (meaning this test can be used) for the duration of the COVID-19 declaration under Section 564(b)(1) of the Act, 21 U.S.C.section 360bbb-3(b)(1), unless the authorization is terminated  or revoked sooner.       Influenza A  by PCR NEGATIVE NEGATIVE Final   Influenza B by PCR NEGATIVE NEGATIVE Final    Comment: (NOTE) The Xpert Xpress SARS-CoV-2/FLU/RSV plus assay is intended as an aid in the diagnosis of influenza from Nasopharyngeal swab specimens and should not be used as a sole basis for treatment. Nasal washings and aspirates are unacceptable for Xpert Xpress SARS-CoV-2/FLU/RSV testing.  Fact Sheet for Patients: EntrepreneurPulse.com.au  Fact Sheet for Healthcare Providers: IncredibleEmployment.be  This test is not yet approved or cleared by the [place] FDA and has been authorized for detection and/or diagnosis of SARS-CoV-2 by FDA under an Emergency Use Authorization (EUA). This EUA will remain in effect (meaning this test can be used) for the duration of the COVID-19 declaration under Section 564(b)(1) of the Act, 21 U.S.C. section 360bbb-3(b)(1), unless the authorization is terminated or revoked.  Performed at [facility], [place] [address], [place], [place] [address]   Surgical pcr screen     Status: Abnormal   Collection Time: 11/11/20 11:00 PM   Specimen: Nasal Mucosa; Nasal Swab  Result Value Ref Range Status   MRSA, PCR NEGATIVE NEGATIVE Final   Staphylococcus aureus POSITIVE (A) NEGATIVE Final    Comment: (NOTE) The Xpert SA Assay (FDA approved for NASAL specimens in patients 29 years of age and older), is one component of a comprehensive surveillance program. It is not intended to diagnose infection nor to guide or monitor treatment. Performed at [facility], [place] [address], [place], [place] [address]      Labs:  CBC: Recent Labs  Lab 11/10/20 2236 11/11/20 0426 11/12/20 0412 11/13/20 0401  WBC 8.2 7.5 10.1 7.0  NEUTROABS 5.0  --   --   --   HGB 13.7 14.5 13.4 13.0  HCT 40.0 43.7 41.3 39.2  MCV 87.7 92.0 91.8 91.4  PLT 294 254 302 324   BMP &GFR Recent Labs  Lab 11/10/20 2236  11/11/20 0426 11/12/20 0412 11/13/20 0401  NA 137 137 134* 133*  K 3.4* 3.4* 4.1 4.0  CL 103 99 103 102  CO2 26 28 22 24   GLUCOSE 275* 313* 208* 356*  BUN 15 14 9 11   CREATININE 0.90 0.81 0.76 0.92  CALCIUM 9.4 9.3 8.8* 8.6*  MG  --  1.9 1.6* 2.0  PHOS  --   --  2.7 3.1   Estimated Creatinine Clearance: 80.5 mL/min (by C-G formula based on SCr of 0.92 mg/dL). Liver & Pancreas: Recent Labs  Lab 11/11/20 0426 11/12/20 0412 11/13/20 0401  AST 138* 112* 82*  ALT 81* 176* 175*  ALKPHOS  155* 147* 163*  BILITOT 0.5 0.9 0.6  PROT 7.3 6.0* 6.2*  ALBUMIN 3.8 3.2* 3.1*   No results for input(s): LIPASE, AMYLASE in the last 168 hours. No results for input(s): AMMONIA in the last 168 hours. Diabetic: Recent Labs    11/11/20 0426  HGBA1C 12.4*   Recent Labs  Lab 11/12/20 1504 11/12/20 1647 11/12/20 2110 11/13/20 0751 11/13/20 1134  GLUCAP 129* 154* 313* 220* 300*   Cardiac Enzymes: Recent Labs  Lab 11/11/20 0426 11/13/20 0401  CKTOTAL 83 44   No results for input(s): PROBNP in the last 8760 hours. Coagulation Profile: No results for input(s): INR, PROTIME in the last 168 hours. Thyroid Function Tests: No results for input(s): TSH, T4TOTAL, FREET4, T3FREE, THYROIDAB in the last 72 hours. Lipid Profile: No results for input(s): CHOL, HDL, LDLCALC, TRIG, CHOLHDL, LDLDIRECT in the last 72 hours. Anemia Panel: No results for input(s): VITAMINB12, FOLATE, FERRITIN, TIBC, IRON, RETICCTPCT in the last 72 hours. Urine analysis:    Component Value Date/Time   COLORURINE STRAW (A) 08/28/2020 1615   APPEARANCEUR CLEAR 08/28/2020 1615   LABSPEC 1.026 08/28/2020 1615   PHURINE 5.0 08/28/2020 1615   GLUCOSEU >=500 (A) 08/28/2020 1615   HGBUR SMALL (A) 08/28/2020 1615   BILIRUBINUR NEGATIVE 08/28/2020 [place] 08/28/2020 1615   PROTEINUR NEGATIVE 08/28/2020 1615   UROBILINOGEN 0.2 02/07/2020 1901   NITRITE NEGATIVE 08/28/2020 1615   LEUKOCYTESUR NEGATIVE  08/28/2020 1615   Sepsis Labs: Invalid input(s): PROCALCITONIN, LACTICIDVEN   Time coordinating discharge: 35 minutes  SIGNED:  Mercy Riding, MD  [facility] 11/13/2020, 5:35 PM  If 7PM-7AM, please contact night-coverage [email or web address]

## 2020-11-13 NOTE — Discharge Instructions (Signed)
Insulin Pen Instructions:  1. Remove Insulin pen cap and clean pen 1st with alcohol rub and then clean skin 2nd with alcohol rub 2. Twist insulin pen needle onto pen (right tighty) 3. Remove outer cap and inner cap from needle 4. Dial pen to 2 units and perform prime- press pen to zero and make sure liquid (insulin) comes out of the needle 5. Dial pen to your dose and perform injection into your abdomen 6. Hold needle in skin for 10 seconds after injection 7. Remove needle from insulin pen and discard 8. Place cap back on insulin pen and store safely (at room temperature) 9. Store unused pens in refrigerator and can keep opened insulin pen at room temperature (discard used pen after 30 days)     Managing your diabetes:  Your A1c is 12.8%. Your goal A1c is less than 7.0%. See below for more information about A1c.  1. Check your blood glucose 2-3 times a day before meals and keep blood glucose log. 2. Inject 20 units of your insulin twice a day after breakfast and dinner. 3. Take your metformin as prescribed. 4. Eat regular meals (breakfast, lunch and dinner) around-the-clock. You may snack as needed. See below about few tips and recommendations on diet and exercise.  5. Watch for symptoms of low blood sugar (hypoglycemia). Read below about these symptoms and management. 6. Please follow-up with your primary care doctor in 1 to 2 weeks.  Take your medications and blood glucose log to your follow-up. 7. You also need an eye exam as soon as possible.  Please find an eye doctor and schedule an appointment as soon as possible. 8. Do not walk barefoot.  Wear appropriate shoe with good cushion . Check your feet for any skin break or wound daily.  What is A1c:  The A1C test result reflects your average blood sugar level for the past two to three months. Specifically, the A1C test measures what percentage of your hemoglobin -- a protein in red blood cells that carries oxygen -- is coated with sugar  (glycated). The higher your A1C level, the poorer your blood sugar control and the higher your risk of diabetes complications. Portion Size    Choose healthier foods such as 100% whole grains, vegetables, fruits, beans, nut seeds, olive oil, most vegetable oils, fat-free dietary, wild game and fish.   Avoid sweet tea, other sweetened beverages, soda, fruit juice, cold cereal and milk and trans fat.   Eat at least 3 meals and 1-2 snacks per day.  Aim for no more than 5 hours between eating.  Eat breakfast within one hour of getting up.    Exercise at least 150 minutes per week, including weight resistance exercises 3 or 4 times per week.   Try to lose at least 7-10% of your current body weight.   Hypoglycemia Hypoglycemia is when the sugar (glucose) level in the blood is too low. Symptoms of low blood sugar may include:  Feeling: ? Hungry. ? Worried or nervous (anxious). ? Sweaty and clammy. ? Confused. ? Dizzy. ? Sleepy. ? Sick to your stomach (nauseous).  Having: ? A fast heartbeat. ? A headache. ? A change in your vision. ? Jerky movements that you cannot control (seizure). ? Nightmares. ? Tingling or no feeling (numbness) around the mouth, lips, or tongue.  Having trouble with: ? Talking. ? Paying attention (concentrating). ? Moving (coordination). ? Sleeping.  Shaking.  Passing out (fainting).  Getting upset easily (irritability).  Low blood sugar  can happen to people who have diabetes and people who do not have diabetes. Low blood sugar can happen quickly, and it can be an emergency. Treating Low Blood Sugar Low blood sugar is often treated by eating or drinking something sugary right away. If you can think clearly and swallow safely, follow the 15:15 rule:  Take 15 grams of a fast-acting carb (carbohydrate). Some fast-acting carbs are: ? 1 tube of glucose gel. ? 3 sugar tablets (glucose pills). ? 6-8 pieces of hard candy. ? 4 oz (120 mL) of fruit  juice. ? 4 oz (120 mL) of regular (not diet) soda.  Check your blood sugar 15 minutes after you take the carb.  If your blood sugar is still at or below 70 mg/dL (3.9 mmol/L), take 15 grams of a carb again.  If your blood sugar does not go above 70 mg/dL (3.9 mmol/L) after 3 tries, get help right away.  After your blood sugar goes back to normal, eat a meal or a snack within 1 hour.  Treating Very Low Blood Sugar If your blood sugar is at or below 54 mg/dL (3 mmol/L), you have very low blood sugar (severe hypoglycemia). This is an emergency. Do not wait to see if the symptoms will go away. Get medical help right away. Call your local emergency services (911 in the [place] Do not drive yourself to the hospital. If you have very low blood sugar and you cannot eat or drink, you may need a glucagon shot (injection). A family member or friend should learn how to check your blood sugar and how to give you a glucagon shot. Ask your doctor if you need to have a glucagon shot kit at home. Follow these instructions at home: General instructions  Avoid any diets that cause you to not eat enough food. Talk with your doctor before you start any new diet.  Take over-the-counter and prescription medicines only as told by your doctor.  Limit alcohol to no more than 1 drink per day for nonpregnant women and 2 drinks per day for men. One drink equals 12 oz of beer, 5 oz of wine, or 1 oz of hard liquor.  Keep all follow-up visits as told by your doctor. This is important. If You Have Diabetes:   Make sure you know the symptoms of low blood sugar.  Always keep a source of sugar with you, such as: ? Sugar. ? Sugar tablets. ? Glucose gel. ? Fruit juice. ? Regular soda (not diet soda). ? Milk. ? Hard candy. ? Honey.  Take your medicines as told.  Follow your exercise and meal plan. ? Eat on time. Do not skip meals. ? Follow your sick day plan when you cannot eat or drink normally. Make this plan  ahead of time with your doctor.  Check your blood sugar as often as told by your doctor. Always check before and after exercise.  Share your diabetes care plan with: ? Your work or school. ? People you live with.  Check your pee (urine) for ketones: ? When you are sick. ? As told by your doctor.  Carry a card or wear jewelry that says you have diabetes. If You Have Low Blood Sugar From Other Causes:   Check your blood sugar as often as told by your doctor.  Follow instructions from your doctor about what you cannot eat or drink. Contact a doctor if:  You have trouble keeping your blood sugar in your target range.  You have  low blood sugar often. Get help right away if:  You still have symptoms after you eat or drink something sugary.  Your blood sugar is at or below 54 mg/dL (3 mmol/L).  You have jerky movements that you cannot control.

## 2020-11-13 NOTE — Progress Notes (Addendum)
Inpatient Diabetes Program Recommendations  AACE/ADA: New Consensus Statement on Inpatient Glycemic Control (2015)  Target Ranges:  Prepandial:   less than 140 mg/dL      Peak postprandial:   less than 180 mg/dL (1-2 hours)      Critically ill patients:  140 - 180 mg/dL   Results for SENAI, RAMNATH (MRN [id number]) as of 11/13/2020 06:55  Ref. Range 11/12/2020 07:29 11/12/2020 11:36 11/12/2020 15:04 11/12/2020 16:47 11/12/2020 21:10  Glucose-Capillary Latest Ref Range: 70 - 99 mg/dL 165 (H)  3 units NOVOLOG  120 (H) 129 (H)   8 mg Decadron _0 :12pm 154 (H) 313 (H)  11 units NOVOLOG _1 :17pm  6 units LANTUS _2 :20pm   Results for SMERA, GUYETTE (MRN [id number]) as of 11/13/2020 11:11  Ref. Range 11/13/2020 07:51  Glucose-Capillary Latest Ref Range: 70 - 99 mg/dL 220 (H)   Results for DENECE, SHEARER (MRN [id number]) as of 11/13/2020 06:55  Ref. Range 11/11/2020 04:26  Hemoglobin A1C Latest Ref Range: 4.8 - 5.6 % 12.4 (H)  (309 mg/dl)    Admit with: Left facial cellulitis with abscess/ Abscessed teeth  History: Type 2 DM  Home DM Meds: Metformin 500 mg BID (NOT taking)  Current Orders: Novolog Moderate Correction Scale/ SSI (0-15 units) TID AC + HS      Lantus 6 units BID      Novolog 4 units TID with meals    Met with pt this AM around 10:20am.  Pt told me she used to take Insulin at home about 2 years ago with the Insulin pen--Stated it was blue and thinks the name of the Insulin was Novolog.  Has CBG meter at home.  Told me she stopped taking Metformin b/c it made her feel sick.  Pt told me Dr. Cyndia Skeeters stated he wants her to go home on insulin + retry the Metformin--Could not recall which insulin he stated he would send her home on.  We reviewed the 2 insulins she is getting here in the hospital (Lantus and Novolog)--Discussed with pt what each insulin is, how they work, when to take, etc.  Reviewed signs and symptoms of Hyperglycemia and Hypoglycemia and how to treat each condition  at home.  Also re-reviewed the importance of good CBG control at home especially in light of her need for healing post-op and and to prevent further infection.  Pt agreeable to take insulin but only in pen form as she would prefer pens and has used insulin pens in the past.  Asked pt to check her CBGs at least TID AC meals and also occasionally 2-hrs after a meal.  Educated patient on insulin pen.  Reviewed all steps of insulin pen including attachment of needle, 2-unit air shot, dialing up dose, giving injection, rotation of injection sites, removing needle, disposal of sharps, storage of unused insulin, disposal of insulin etc.  Patient able to provide successful return demonstration.  Reviewed troubleshooting with insulin pen.    Also discussed with pt need for PCP for diabetes management after discharge.  Currently uninsured but told me her husband will be able to have insurance in less than 90 days with his job and that she will be added onto his insurance.  Strongly encouraged pt to go ahead and call PCP to make appt now and can make the appt for after she has insurance.  Pt agreeable.  TOC consult placed.   --Will follow patient during hospitalization--  Wyn Quaker RN, MSN, CDE Diabetes Coordinator Inpatient Glycemic Control Team Team  Pager: [phone] (8a-5p)

## 2020-11-24 NOTE — Anesthesia Postprocedure Evaluation (Signed)
Anesthesia Post Note  Patient: Karen Roberts  Procedure(s) Performed: DENTAL RESTORATION/EXTRACTIONS (N/A ) INCISION AND DRAINAGE ABSCESS (N/A )     Patient location during evaluation: PACU Anesthesia Type: General Level of consciousness: awake Pain management: pain level controlled Respiratory status: spontaneous breathing Cardiovascular status: stable Postop Assessment: no apparent nausea or vomiting Anesthetic complications: no   No complications documented.  Last Vitals:  Vitals:   11/13/20 0128 11/13/20 0558  BP: 121/72 113/61  Pulse: (!) 102 79  Resp: 16 16  Temp:  36.8 C  SpO2: 95% 95%    Last Pain:  Vitals:   11/13/20 0930  TempSrc:   PainSc: 3                  John F Salome Arnt

## 2020-12-30 ENCOUNTER — Inpatient Hospital Stay: Payer: Self-pay | Admitting: Physician Assistant

## 2020-12-30 NOTE — Progress Notes (Deleted)
Patient ID: Karen Roberts, female   DOB: 1971/03/26, 50 y.o.   MRN: [id number]   After hospitalization 4/13-4/15/2022  From discharge summary: Hospital Course: 50 year old F with PMH of DM-2, hepatic steatosis and anxiety presenting with facial swelling, and admitted for left facial and neck cellulitis of odontogenic origin. Maxillofacial CT concerning for cellulitis, phlegmon and dental caries. Patient was started on amoxicillin and clindamycin and admitted.  The next day, patient was continued on IV Unasyn. Oral surgery, Dr. Hoyt Koch consulted. Patient underwent incision and drainage of left buccal vestibule mandible abscess and dental extraction (#17, 18 and 19) on 11/12/2020.   On the day of discharge, swelling and pain improved.  Tolerated regular diet.  She felt well and ready to go home.  Oral surgery recommended follow-up in 1 week.   In regards to her diabetes, A1c 12.4%.  Restarted on metformin and added 70/30 insulin at 20 units twice daily.  I have not started a statin due to elevated liver enzymes.  Encouraged to have an eye exam and establish care with local PCP.  Counseled on foot care.  See individual problem list below for more on hospital course.  Discharge Diagnoses:  Left facial cellulitiswith abscess-maxillofacial CT with cellulitis and focal phlegmonous change next to left mandible without discrete abscess or drainable fluid collection. She has notable dental caries on the left. -Status post I&D and dental extraction as above -IV Unasyn 4/13-4/15.  P.o. Augmentin for 8 more days -Instructed to brush her teeth and rinse her mouth with warm salty water 2-3 times a day -Outpatient follow-up with oral surgery in 1 week -Patient tolerated regular diet prior to discharge.  Uncontrolled DM-2 with hyperglycemia: Has some hyperglycemia after surgery likely stress-induced.  A1c 12.4%.  Does not take medication at home. -Restarted metformin -Insulin 70/30 pen at 20  units twice daily.  She was given match letter to take to pharmacy -She says she has diabetic supplies at home. -Renewed a prescription for lisinopril -Consider starting statin if LFT resolves.  Elevated liver enzymes: She denies alcohol in the last 1 week. CK within normal.No significant tenderness over RUQ.  Acute hepatitis panel negative.  Improving. -Recheck CMP at follow-up  Hypokalemia/hypomagnesemia: Resolved.  Essential hypertension: Normotensive. -Continue home lisinopril   Class I obesity Body mass index is 33.28 kg/m.  -Metformin could help -Encourage lifestyle change to lose weight.

## 2021-09-03 ENCOUNTER — Emergency Department (HOSPITAL_COMMUNITY): Payer: Self-pay

## 2021-09-03 ENCOUNTER — Other Ambulatory Visit: Payer: Self-pay

## 2021-09-03 ENCOUNTER — Inpatient Hospital Stay (HOSPITAL_COMMUNITY)
Admission: EM | Admit: 2021-09-03 | Discharge: 2021-09-06 | DRG: 638 | Disposition: A | Discharge status: Home / Self Care | Payer: Self-pay | Attending: Surgery | Admitting: Surgery

## 2021-09-03 ENCOUNTER — Inpatient Hospital Stay (HOSPITAL_COMMUNITY): Payer: Self-pay

## 2021-09-03 ENCOUNTER — Encounter (HOSPITAL_COMMUNITY): Payer: Self-pay

## 2021-09-03 DIAGNOSIS — Z833 Family history of diabetes mellitus: Secondary | ICD-10-CM

## 2021-09-03 DIAGNOSIS — K760 Fatty (change of) liver, not elsewhere classified: Secondary | ICD-10-CM | POA: Diagnosis present

## 2021-09-03 DIAGNOSIS — W19XXXA Unspecified fall, initial encounter: Secondary | ICD-10-CM

## 2021-09-03 DIAGNOSIS — Z888 Allergy status to other drugs, medicaments and biological substances status: Secondary | ICD-10-CM

## 2021-09-03 DIAGNOSIS — Z881 Allergy status to other antibiotic agents status: Secondary | ICD-10-CM

## 2021-09-03 DIAGNOSIS — S3013XA Contusion of flank (latus) region, initial encounter: Secondary | ICD-10-CM | POA: Diagnosis present

## 2021-09-03 DIAGNOSIS — Z808 Family history of malignant neoplasm of other organs or systems: Secondary | ICD-10-CM

## 2021-09-03 DIAGNOSIS — T1490XA Injury, unspecified, initial encounter: Secondary | ICD-10-CM

## 2021-09-03 DIAGNOSIS — Z20822 Contact with and (suspected) exposure to covid-19: Secondary | ICD-10-CM | POA: Diagnosis present

## 2021-09-03 DIAGNOSIS — R7401 Elevation of levels of liver transaminase levels: Secondary | ICD-10-CM | POA: Diagnosis present

## 2021-09-03 DIAGNOSIS — Z91119 Patient's noncompliance with dietary regimen due to unspecified reason: Secondary | ICD-10-CM

## 2021-09-03 DIAGNOSIS — E669 Obesity, unspecified: Secondary | ICD-10-CM

## 2021-09-03 DIAGNOSIS — W010XXA Fall on same level from slipping, tripping and stumbling without subsequent striking against object, initial encounter: Secondary | ICD-10-CM | POA: Diagnosis present

## 2021-09-03 DIAGNOSIS — I10 Essential (primary) hypertension: Secondary | ICD-10-CM

## 2021-09-03 DIAGNOSIS — S301XXA Contusion of abdominal wall, initial encounter: Secondary | ICD-10-CM | POA: Diagnosis present

## 2021-09-03 DIAGNOSIS — Y92008 Other place in unspecified non-institutional (private) residence as the place of occurrence of the external cause: Secondary | ICD-10-CM

## 2021-09-03 DIAGNOSIS — S0003XA Contusion of scalp, initial encounter: Secondary | ICD-10-CM | POA: Diagnosis present

## 2021-09-03 DIAGNOSIS — Z801 Family history of malignant neoplasm of trachea, bronchus and lung: Secondary | ICD-10-CM

## 2021-09-03 DIAGNOSIS — Z79899 Other long term (current) drug therapy: Secondary | ICD-10-CM

## 2021-09-03 DIAGNOSIS — E1165 Type 2 diabetes mellitus with hyperglycemia: Principal | ICD-10-CM | POA: Diagnosis present

## 2021-09-03 DIAGNOSIS — Z91014 Allergy to mammalian meats: Secondary | ICD-10-CM

## 2021-09-03 DIAGNOSIS — Z6833 Body mass index (BMI) 33.0-33.9, adult: Secondary | ICD-10-CM

## 2021-09-03 DIAGNOSIS — Z8249 Family history of ischemic heart disease and other diseases of the circulatory system: Secondary | ICD-10-CM

## 2021-09-03 DIAGNOSIS — M25532 Pain in left wrist: Secondary | ICD-10-CM | POA: Diagnosis present

## 2021-09-03 DIAGNOSIS — I959 Hypotension, unspecified: Secondary | ICD-10-CM | POA: Diagnosis present

## 2021-09-03 DIAGNOSIS — Z885 Allergy status to narcotic agent status: Secondary | ICD-10-CM

## 2021-09-03 DIAGNOSIS — R000 Tachycardia, unspecified: Secondary | ICD-10-CM | POA: Diagnosis present

## 2021-09-03 DIAGNOSIS — Z9049 Acquired absence of other specified parts of digestive tract: Secondary | ICD-10-CM

## 2021-09-03 DIAGNOSIS — M79642 Pain in left hand: Secondary | ICD-10-CM | POA: Diagnosis present

## 2021-09-03 DIAGNOSIS — D62 Acute posthemorrhagic anemia: Secondary | ICD-10-CM | POA: Diagnosis present

## 2021-09-03 LAB — URINALYSIS, ROUTINE W REFLEX MICROSCOPIC
Bilirubin Urine: NEGATIVE
Glucose, UA: >1000 mg/dL — AB
Ketones, ur: 15 mg/dL — AB
Nitrite: POSITIVE — AB
Protein, ur: NEGATIVE mg/dL
Specific Gravity, Urine: <1.005 — ABNORMAL LOW (ref 1.005–1.030)
pH: 5.5 (ref 5.0–8.0)

## 2021-09-03 LAB — I-STAT CHEM 8, ED
BUN: 23 mg/dL — ABNORMAL HIGH (ref 6–20)
Calcium, Ion: 1.24 mmol/L (ref 1.15–1.40)
Chloride: 95 mmol/L — ABNORMAL LOW (ref 98–111)
Creatinine, Ser: 0.9 mg/dL (ref 0.44–1.00)
Glucose, Bld: 666 mg/dL (ref 70–99)
HCT: 41 % (ref 36.0–46.0)
Hemoglobin: 13.9 g/dL (ref 12.0–15.0)
Potassium: 4 mmol/L (ref 3.5–5.1)
Sodium: 131 mmol/L — ABNORMAL LOW (ref 135–145)
TCO2: 27 mmol/L (ref 22–32)

## 2021-09-03 LAB — CBC WITH DIFFERENTIAL/PLATELET
Abs Immature Granulocytes: 0.07 10*3/uL (ref 0.00–0.07)
Basophils Absolute: 0.1 10*3/uL (ref 0.0–0.1)
Basophils Relative: 0 %
Eosinophils Absolute: 0.1 10*3/uL (ref 0.0–0.5)
Eosinophils Relative: 1 %
HCT: 38.8 % (ref 36.0–46.0)
Hemoglobin: 13.1 g/dL (ref 12.0–15.0)
Immature Granulocytes: 0 %
Lymphocytes Relative: 13 %
Lymphs Abs: 2.1 10*3/uL (ref 0.7–4.0)
MCH: 30.1 pg (ref 26.0–34.0)
MCHC: 33.8 g/dL (ref 30.0–36.0)
MCV: 89.2 fL (ref 80.0–100.0)
Monocytes Absolute: 1.2 10*3/uL — ABNORMAL HIGH (ref 0.1–1.0)
Monocytes Relative: 7 %
Neutro Abs: 13.3 10*3/uL — ABNORMAL HIGH (ref 1.7–7.7)
Neutrophils Relative %: 79 %
Platelets: 331 10*3/uL (ref 150–400)
RBC: 4.35 MIL/uL (ref 3.87–5.11)
RDW: 12.1 % (ref 11.5–15.5)
WBC: 16.8 10*3/uL — ABNORMAL HIGH (ref 4.0–10.5)
nRBC: 0 % (ref 0.0–0.2)

## 2021-09-03 LAB — HEMOGLOBIN AND HEMATOCRIT, BLOOD
HCT: 35.3 % — ABNORMAL LOW (ref 36.0–46.0)
Hemoglobin: 12 g/dL (ref 12.0–15.0)

## 2021-09-03 LAB — GLUCOSE, CAPILLARY
Glucose-Capillary: 233 mg/dL — ABNORMAL HIGH (ref 70–99)
Glucose-Capillary: 233 mg/dL — ABNORMAL HIGH (ref 70–99)
Glucose-Capillary: 221 mg/dL — ABNORMAL HIGH (ref 70–99)
Glucose-Capillary: 156 mg/dL — ABNORMAL HIGH (ref 70–99)
Glucose-Capillary: 255 mg/dL — ABNORMAL HIGH (ref 70–99)

## 2021-09-03 LAB — CBG MONITORING, ED
Glucose-Capillary: >600 mg/dL (ref 70–99)
Glucose-Capillary: 488 mg/dL — ABNORMAL HIGH (ref 70–99)
Glucose-Capillary: 323 mg/dL — ABNORMAL HIGH (ref 70–99)

## 2021-09-03 LAB — TYPE AND SCREEN
ABO/RH(D): O POS
Antibody Screen: NEGATIVE

## 2021-09-03 LAB — CBC
HCT: 32.4 % — ABNORMAL LOW (ref 36.0–46.0)
Hemoglobin: 10.9 g/dL — ABNORMAL LOW (ref 12.0–15.0)
MCH: 30.1 pg (ref 26.0–34.0)
MCHC: 33.6 g/dL (ref 30.0–36.0)
MCV: 89.5 fL (ref 80.0–100.0)
Platelets: 281 10*3/uL (ref 150–400)
RBC: 3.62 MIL/uL — ABNORMAL LOW (ref 3.87–5.11)
RDW: 12.3 % (ref 11.5–15.5)
WBC: 6.9 10*3/uL (ref 4.0–10.5)
nRBC: 0 % (ref 0.0–0.2)

## 2021-09-03 LAB — COMPREHENSIVE METABOLIC PANEL
ALT: 58 U/L — ABNORMAL HIGH (ref 0–44)
AST: 99 U/L — ABNORMAL HIGH (ref 15–41)
Albumin: 3.6 g/dL (ref 3.5–5.0)
Alkaline Phosphatase: 187 U/L — ABNORMAL HIGH (ref 38–126)
Anion gap: 10 (ref 5–15)
BUN: 20 mg/dL (ref 6–20)
CO2: 24 mmol/L (ref 22–32)
Calcium: 9.3 mg/dL (ref 8.9–10.3)
Chloride: 93 mmol/L — ABNORMAL LOW (ref 98–111)
Creatinine, Ser: 0.96 mg/dL (ref 0.44–1.00)
GFR, Estimated: >60 mL/min (ref >60)
Glucose, Bld: 697 mg/dL (ref 70–99)
Potassium: 3.5 mmol/L (ref 3.5–5.1)
Sodium: 127 mmol/L — ABNORMAL LOW (ref 135–145)
Total Bilirubin: 0.6 mg/dL (ref 0.3–1.2)
Total Protein: 6.9 g/dL (ref 6.5–8.1)

## 2021-09-03 LAB — PREPARE RBC (CROSSMATCH)

## 2021-09-03 LAB — ABO/RH: ABO/RH(D): O POS

## 2021-09-03 LAB — RESP PANEL BY RT-PCR (FLU A&B, COVID) ARPGX2
Influenza A by PCR: NEGATIVE
Influenza B by PCR: NEGATIVE
SARS Coronavirus 2 by RT PCR: NEGATIVE

## 2021-09-03 LAB — MRSA NEXT GEN BY PCR, NASAL: MRSA by PCR Next Gen: NOT DETECTED

## 2021-09-03 MED ORDER — INSULIN REGULAR(HUMAN) IN NACL 100-0.9 UT/100ML-% IV SOLN
INTRAVENOUS | Status: DC
  Filled 2021-09-03: qty 100

## 2021-09-03 MED ORDER — SODIUM CHLORIDE (PF) 0.9 % IJ SOLN
INTRAMUSCULAR | Status: AC
  Filled 2021-09-03: qty 50

## 2021-09-03 MED ORDER — DOCUSATE SODIUM 100 MG PO CAPS
100.0000 mg | ORAL_CAPSULE | Freq: Two times a day (BID) | ORAL | Status: DC
  Administered 2021-09-03 – 2021-09-06 (×5): 100 mg via ORAL
  Filled 2021-09-03 (×6): qty 1

## 2021-09-03 MED ORDER — SODIUM CHLORIDE 0.9 % IV BOLUS
1000.0000 mL | Freq: Once | INTRAVENOUS | Status: AC
  Administered 2021-09-03: 1000 mL via INTRAVENOUS

## 2021-09-03 MED ORDER — SODIUM CHLORIDE 0.9 % IV BOLUS (SEPSIS)
1000.0000 mL | Freq: Once | INTRAVENOUS | Status: AC
  Administered 2021-09-03: 1000 mL via INTRAVENOUS

## 2021-09-03 MED ORDER — ONDANSETRON HCL 4 MG/2ML IJ SOLN
4.0000 mg | Freq: Once | INTRAMUSCULAR | Status: AC
  Administered 2021-09-03: 4 mg via INTRAVENOUS
  Filled 2021-09-03: qty 2

## 2021-09-03 MED ORDER — INSULIN ASPART 100 UNIT/ML IJ SOLN
0.0000 [IU] | Freq: Four times a day (QID) | INTRAMUSCULAR | Status: DC
  Administered 2021-09-03: 2 [IU] via SUBCUTANEOUS
  Administered 2021-09-04: 5 [IU] via SUBCUTANEOUS
  Administered 2021-09-04: 2 [IU] via SUBCUTANEOUS

## 2021-09-03 MED ORDER — HYDROMORPHONE HCL 1 MG/ML IJ SOLN
0.5000 mg | Freq: Once | INTRAMUSCULAR | Status: AC
  Administered 2021-09-03: 0.5 mg via INTRAVENOUS
  Filled 2021-09-03: qty 1

## 2021-09-03 MED ORDER — INSULIN ASPART PROT & ASPART (70-30 MIX) 100 UNIT/ML ~~LOC~~ SUSP
10.0000 [IU] | Freq: Once | SUBCUTANEOUS | Status: AC
  Administered 2021-09-03: 10 [IU] via SUBCUTANEOUS
  Filled 2021-09-03: qty 10

## 2021-09-03 MED ORDER — METHOCARBAMOL 500 MG PO TABS
1000.0000 mg | ORAL_TABLET | Freq: Four times a day (QID) | ORAL | Status: DC
  Administered 2021-09-03 – 2021-09-06 (×13): 1000 mg via ORAL
  Filled 2021-09-03 (×13): qty 2

## 2021-09-03 MED ORDER — ONDANSETRON HCL 4 MG/2ML IJ SOLN: 4.0000 mg | Freq: Four times a day (QID) | INTRAMUSCULAR | Status: DC | PRN

## 2021-09-03 MED ORDER — INSULIN ASPART PROT & ASPART (70-30 MIX) 100 UNIT/ML ~~LOC~~ SUSP: 10.0000 [IU] | Freq: Two times a day (BID) | SUBCUTANEOUS | Status: DC

## 2021-09-03 MED ORDER — LACTATED RINGERS IV SOLN: INTRAVENOUS | Status: DC

## 2021-09-03 MED ORDER — INSULIN ASPART 100 UNIT/ML IJ SOLN
10.0000 [IU] | Freq: Once | INTRAMUSCULAR | Status: AC
  Administered 2021-09-03: 10 [IU] via SUBCUTANEOUS
  Filled 2021-09-03: qty 0.1

## 2021-09-03 MED ORDER — INSULIN ASPART 100 UNIT/ML IJ SOLN: 0.0000 [IU] | Freq: Three times a day (TID) | INTRAMUSCULAR | Status: DC

## 2021-09-03 MED ORDER — KETAMINE HCL 50 MG/5ML IJ SOSY
0.3000 mg/kg | PREFILLED_SYRINGE | Freq: Once | INTRAMUSCULAR | Status: AC
  Administered 2021-09-03: 27 mg via INTRAVENOUS
  Filled 2021-09-03: qty 5

## 2021-09-03 MED ORDER — OXYCODONE HCL 5 MG/5ML PO SOLN
5.0000 mg | ORAL | Status: DC | PRN
  Administered 2021-09-03: 10 mg via ORAL
  Administered 2021-09-03: 5 mg via ORAL
  Filled 2021-09-03 (×2): qty 10

## 2021-09-03 MED ORDER — SODIUM CHLORIDE 0.9 % IV SOLN: 10.0000 mL/h | Freq: Once | INTRAVENOUS | Status: DC

## 2021-09-03 MED ORDER — ONDANSETRON 4 MG PO TBDP
4.0000 mg | ORAL_TABLET | Freq: Four times a day (QID) | ORAL | Status: DC | PRN
  Filled 2021-09-03: qty 1

## 2021-09-03 MED ORDER — ACETAMINOPHEN 500 MG PO TABS
1000.0000 mg | ORAL_TABLET | Freq: Four times a day (QID) | ORAL | Status: DC
  Administered 2021-09-03 – 2021-09-06 (×13): 1000 mg via ORAL
  Filled 2021-09-03 (×13): qty 2

## 2021-09-03 MED ORDER — DEXTROSE 50 % IV SOLN: 0.0000 mL | INTRAVENOUS | Status: DC | PRN

## 2021-09-03 MED ORDER — IOHEXOL 300 MG/ML  SOLN
100.0000 mL | Freq: Once | INTRAMUSCULAR | Status: AC | PRN
  Administered 2021-09-03: 100 mL via INTRAVENOUS

## 2021-09-03 MED ORDER — INSULIN GLARGINE-YFGN 100 UNIT/ML ~~LOC~~ SOLN
10.0000 [IU] | Freq: Every day | SUBCUTANEOUS | Status: DC
  Administered 2021-09-03: 10 [IU] via SUBCUTANEOUS
  Filled 2021-09-03 (×2): qty 0.1

## 2021-09-03 NOTE — Progress Notes (Signed)
Orthopedic Tech Progress Note Patient Details:  Karen Roberts 1971/05/09 [id number] Patient was unable to have an abdominal binder applied due to her pain level.  Patient ID: [name], female   DOB: 18-Feb-1971, 51 y.o.   MRN: [id number]  Linus Salmons Addysyn Fern 09/03/2021, 10:09 AM

## 2021-09-03 NOTE — ED Notes (Signed)
Pt O2 dropped to 86% after dilaudid administration. Placed on 2L [place], O2 back to 97%

## 2021-09-03 NOTE — ED Notes (Signed)
Assisted pt to bedside commode. Pt moving slowly and in a lot of pain but overall tolerated well.

## 2021-09-03 NOTE — Progress Notes (Signed)
Inpatient Diabetes Program Recommendations  AACE/ADA: New Consensus Statement on Inpatient Glycemic Control  Target Ranges:  Prepandial:   less than 140 mg/dL      Peak postprandial:   less than 180 mg/dL (1-2 hours)      Critically ill patients:  140 - 180 mg/dL    Latest Reference Range & Units 09/03/21 02:38 09/03/21 06:53 09/03/21 11:03 09/03/21 12:37  Glucose-Capillary 70 - 99 mg/dL >600 (HH) 488 (H) 323 (H) 233 (H)    Latest Reference Range & Units 09/03/21 04:08  CO2 22 - 32 mmol/L 24  Glucose 70 - 99 mg/dL 697 (HH)  Anion gap 5 - 15  10    Latest Reference Range & Units 11/11/20 04:26  Hemoglobin A1C 4.8 - 5.6 % 12.4 (H)   Review of Glycemic Control  Diabetes history: DM2 Outpatient Diabetes medications: None; prescribed 70/30 20 units BID and Metformin 500 mg BID (to increase to 1000 mg BID after 15 days) at last hospital discharge on 11/13/20 Current orders for Inpatient glycemic control: IV insulin  Inpatient Diabetes Program Recommendations:    Insulin: Noted patient received 70/30 10 units at 9:00 am today. Once provider is ready to transition to SQ insulin, please consider ordering 70/30 10 units BID, CBGs AC&HS, Novolog 0-9 units ACHS.  Outpatient DM medications: Recommend patient be discharged on Novolin 70/30 (pens; [phone]; insulin pen needles [phone]). Patient does not want to take Metformin due to GI intolerance.  NOTE: Noted consult for diabetes coordinator. In reviewing chart, noted patient was inpatient 11/12/20-11/13/20, was seen by inpatient diabetes coordinator on 11/13/20, and discharged on 70/30 20 units BID and Metformin 500 mg BID (to increase to 1000 mg BID after 15 days). Per TOC note on 11/13/20, an appointment was made for patient at [facility]) for June 1st. No notes in chart at all from [facility] since last discharge.  Per H&P on 09/03/20 "She reports that she previously was on metformin for her diabetes but this gave her  diarrhea and made her lose weight so she stopped this.  It has been greater than 1 year since she has been on any medication for her diabetes or checked her blood sugars."  Spoke with patient at bedside. Patient lying in bed with eyes closed and awakened easily to name. Patient alert and oriented; engaged in conversation. Inquired about DM control and patient states that she has not been on any DM medications recently. She notes that she has been on Metformin and insulin pens in the past. Patient states that she does not like to take Metformin because it causes her to have diarrhea. Patient states that she has some insulin pens in the refrigerator at home but not sure of the name of them; she states she has had them for well over 1 year.  Encouraged patient to check date on insulin pens and if expired to discard of the insulin pens she has at home. Inquired about last hospital discharge on 11/13/20 in which 70/30 and Metformin were prescribed and MATCH voucher provided. Patient states that she did not get any DM medication filled once she was discharged from the hospital on 11/13/20 and she was not given any type of MATCH voucher.  Inquired about follow up and patient states she has not have a PCP. Inquired about follow up with [facility] (per TOC note on 11/13/20 for December 30, 2020) and patient states she was not told about any follow up visits with [facility] when  she was discharged in April 2022. Patient states she has not been checking glucose at home and she states she has several glucometers but no test strips. Patient states she has Reli-On glucometer but had trouble finding strips for them last year when she looked at [employer]. Discussed going back to [facility] and getting test strips for Reli-on glucometer or purchasing a new Reli-On glucometer for $9 and box of 50 test strips for $9.   Patient states she has been having symptoms of hyperglycemia for "a very long time."    Discussed glucose and A1C goals. Discussed  importance of checking CBGs and maintaining good CBG control to prevent long-term and short-term complications. Explained how hyperglycemia leads to damage within blood vessels which lead to the common complications seen with uncontrolled diabetes. Stressed to the patient the importance of improving glycemic control to prevent further complications from uncontrolled diabetes. Discussed impact of nutrition, exercise, stress, sickness, and medications on diabetes control.  Discussed carbohydrates, carbohydrate goals per day and meal, along with portion sizes. Patient reports that she has been eating high carb foods (eats a lot of pasta) and drinking regular soda. Discussed carb modified diet; encouraged patient to limit carbs to 50-75 grams of carbs per meal, 15 grams per snack, and eliminate sugary beverages. Informed patient a Living Well with DM book would be ordered and encouraged her to read through book once she felt better to learn more about DM management and importance of DM control. Explained to patient that she will need to take insulin outpatient consistently. Patient asked if she was no longer a Type 2 diabetic. Discussed progressive nature of DM2 and some patients with DM2 become insulin dependent. Discussed initial glucose was 697 mg/dl and her last A1C was 12.4% on 11/11/20 and explained that it would be recommended that she be discharged on insulin. Explained she needs to get established with a local clinic for consistent follow up and the providers there could decide if it was appropriate to try other oral DM medications or not.  Informed patient that TOC would be consulted to assist with follow up and with medication needs. Discussed 70/30 insulin, how it works, and how it is typically taken with breakfast and supper.  Explained that Novolin 70/30 can be purchased at [facility] for $25 per vial or $43 per box of 5 insulin pens. Patient would prefer to use insulin pens.  Encouraged patient to check  glucose 3-4 times per day and to keep a log book of glucose readings and DM medication taken which patient will need to take to doctor appointments. Explained how the doctor can use the glucose values to continue to make adjustments with DM medications if needed.  Patient verbalized understanding of information discussed and reports no further questions at this time related to diabetes.   Thanks, Barnie Alderman, RN, MSN, CDE Diabetes Coordinator Inpatient Diabetes Program [phone] (Team Pager from 8am to 5pm)

## 2021-09-03 NOTE — ED Provider Notes (Signed)
Karen Roberts DEPT Provider Note   CSN: [id number] Arrival date & time: 09/03/21  0226     History  Chief Complaint  Patient presents with   Lytle Michaels    Karen Roberts is a 51 y.o. female with a past medical history of insulin-dependent diabetes who states that she manages it "with diet."  Who presents emergency department after fall.  Patient states that she slipped on some water in her house and hit her back in the back of her head.  She complains of severe left flank pain.  She was brought in by EMS prior to arrival.  She is unsure if she is up-to-date on her tetanus vaccination.  She did not lose consciousness and is not on any blood thinners.   Fall      Home Medications Prior to Admission medications   Medication Sig Start Date End Date Taking? Authorizing Provider  Ascorbic Acid (VITAMIN C PO) Take 1 tablet by mouth daily.    [provider]  Echinacea 125 MG CAPS Take 125 mg by mouth daily.    [provider]  insulin aspart protamine - aspart (NOVOLOG 70/30 MIX) (70-30) 100 UNIT/ML FlexPen Inject 0.2 mLs (20 Units total) into the skin 2 (two) times daily with a meal. 11/13/20   Gonfa, Charlesetta Ivory, MD  Insulin Pen Needle (PEN NEEDLES) 30G X 5 MM MISC 1 pen by Does not apply route in the morning and at bedtime. 11/13/20   Mercy Riding, MD  lisinopril (ZESTRIL) 5 MG tablet Take 1 tablet (5 mg total) by mouth daily. 11/13/20 02/11/21  Mercy Riding, MD  metFORMIN (GLUCOPHAGE) 500 MG tablet Take 1 tablet (500 mg total) by mouth 2 (two) times daily with a meal for 15 days, THEN 2 tablets (1,000 mg total) 2 (two) times daily with a meal. 11/13/20 02/11/21  Mercy Riding, MD  Multiple Vitamin (MULTIVITAMIN WITH MINERALS) TABS tablet Take 1 tablet by mouth daily.    [provider]      Allergies    Ciprofloxacin, Morphine and related, Pork-derived products, and Morphine    Review of Systems   Review of Systems As per the HPI Physical  Exam Updated Vital Signs BP (!) 128/107 (BP Location: Left Arm)    Pulse (!) 132    Temp 97.7 F (36.5 C) (Oral)    Resp (!) 22    Wt 90.7 kg    LMP 11/29/2016 (Approximate)    SpO2 94%    BMI 33.28 kg/m  Physical Exam Vitals and nursing note reviewed.  Constitutional:      General: She is not in acute distress.    Appearance: She is well-developed. She is not diaphoretic.  HENT:     Head: Normocephalic and atraumatic.     Right Ear: External ear normal.     Left Ear: External ear normal.     Mouth/Throat:     Mouth: Mucous membranes are moist.  Eyes:     General: No scleral icterus.    Extraocular Movements: Extraocular movements intact.     Conjunctiva/sclera: Conjunctivae normal.     Pupils: Pupils are equal, round, and reactive to light.  Cardiovascular:     Rate and Rhythm: Regular rhythm. Tachycardia present.     Heart sounds: Normal heart sounds. No murmur heard.   No friction rub. No gallop.  Pulmonary:     Effort: Pulmonary effort is normal. No respiratory distress.     Breath sounds: Normal breath  sounds.  Abdominal:     General: Bowel sounds are normal. There is no distension.     Palpations: Abdomen is soft. There is no mass.     Tenderness: There is no abdominal tenderness. There is no guarding.  Musculoskeletal:     Comments: No midline spinal tenderness on palpation. Large left flank hematoma which exquisite tenderness, no tenderness to palpation along the buttocks.  Extremities without deformity.  Skin:    General: Skin is warm and dry.  Neurological:     Mental Status: She is alert and oriented to person, place, and time.  Psychiatric:        Behavior: Behavior normal.    ED Results / Procedures / Treatments   Labs (all labs ordered are listed, but only abnormal results are displayed) Labs Reviewed  CBG MONITORING, ED - Abnormal; Notable for the following components:      Result Value   Glucose-Capillary >600 (*)    All other components within normal  limits  CBC WITH DIFFERENTIAL/PLATELET  COMPREHENSIVE METABOLIC PANEL  URINALYSIS, ROUTINE W REFLEX MICROSCOPIC  I-STAT CHEM 8, ED    EKG None  Radiology No results found.  Procedures Procedures    Medications Ordered in ED Medications  sodium chloride 0.9 % bolus 1,000 mL (has no administration in time range)  HYDROmorphone (DILAUDID) injection 0.5 mg (has no administration in time range)  ondansetron (ZOFRAN) injection 4 mg (has no administration in time range)    ED Course/ Medical Decision Making/ A&P                           Medical Decision Making Amount and/or Complexity of Data Reviewed Labs: ordered. Radiology: ordered.  Risk Prescription drug management. Decision regarding hospitalization.   Patient here with fall- large hematoma of L flank. I ordered and reviewed labs:  Sig findings include- Hyperglycemia without anion gap, 1 g drop in hgb  CT Chest abd pelvis w contrats shows- indenpendently interpreted shows large hematoma L flank no active extrav or fractures, no internal injureis CThead and neck negative Patient requiring multiple doses of IV pain control  Given fluids and sub Q insulin Will need trauma consult- s/o given to PA Ransom at shift change  Final Clinical Impression(s) / ED Diagnoses Final diagnoses:  Trauma    Rx / DC Orders ED Discharge Orders     None         Margarita Mail, PA-C 09/03/21 1512    Maudie Flakes, MD 09/04/21 0700

## 2021-09-03 NOTE — ED Triage Notes (Signed)
Pt BIB via EMS after having a fall. Pt c/o left sided flank pain and has a swollen area. Pt received 73mcg of fentanyl enroute.

## 2021-09-03 NOTE — H&P (Addendum)
Oley Balm [birth year]  [id number].    Requesting MD: Dr. Aletta Edouard (attending). PA Sherrell Puller Chief Complaint/Reason for Consult: Fall  HPI: Karen Roberts is a 51 y.o. female with a hx DM2 who presented to [facility] ED after a fall.  Patient reports that she was in her garage talking to her husband when she slipped on water and fell backwards onto her left side.  She did hit her head.  No loss of consciousness.  Patient complains of headache, left hand pain and left flank pain.  No other complaints.  She is very tearful.  Work-up noted left posterior chest/flank subcu hematoma with estimated volume of 360 mL.  Patient noted to become hypotensive around 815 with BP 86/66.  EDP reports this was after giving ketamine which looks like it was given at prior to 7 AM. 1U PRBC ordered.  Patient denies any blood thinner use. patient also noted to have glucose 697 on arrival.  She was given 10 units of insulin with improvement to 448.  She reports that she previously was on metformin for her diabetes but this gave her diarrhea and made her lose weight so she stopped this.  It has been greater than 1 year since she has been on any medication for her diabetes or checked her blood sugars.  Patient notes history of prior cholecystectomy and appendectomy.  Occasional alcohol use.  No tobacco use.  No illicit drug use.  She is not currently employed.  Lives at home with her husband.  ROS: Review of Systems  Constitutional:  Negative for chills and fever.  Respiratory:  Negative for cough.   Cardiovascular:  Negative for chest pain.  Gastrointestinal:  Negative for abdominal pain, nausea and vomiting.  Genitourinary:        Has not voided since arrival  Musculoskeletal:  Positive for falls and joint pain. Negative for back pain.  Neurological:  Positive for headaches.  All other systems reviewed and are negative.  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 Georgette Dover, MD;  Location: [place];  Service: General;  Laterality: N/A;   ERCP  12/14/2011   Procedure: ENDOSCOPIC RETROGRADE CHOLANGIOPANCREATOGRAPHY (ERCP);  Surgeon: Gatha Mayer, MD;  Location: [place];  Service: Endoscopy;  Laterality: N/A;   ERCP  01/05/2012   Procedure: ENDOSCOPIC RETROGRADE CHOLANGIOPANCREATOGRAPHY (ERCP);  Surgeon: Gatha Mayer, MD;  Location: [facility];  Service: Gastroenterology;  Laterality: N/A;   ESOPHAGOGASTRODUODENOSCOPY  02/07/2012   Procedure: ESOPHAGOGASTRODUODENOSCOPY (EGD);  Surgeon: Gatha Mayer, MD;  Location: [facility];  Service: Endoscopy;  Laterality: N/A;  endo with stent removal/ no dye needed/may  need ercp scope/need xray   INCISION AND DRAINAGE ABSCESS N/A 11/12/2020   Procedure: INCISION AND DRAINAGE ABSCESS;  Surgeon: Diona Browner, DMD;  Location: [facility];  Service: Oral Surgery;  Laterality: N/A;   RLQ abscess drain placement  12/12/11   TOOTH EXTRACTION N/A 11/12/2020   Procedure: DENTAL RESTORATION/EXTRACTIONS;  Surgeon: Diona Browner, DMD;  Location: [facility];  Service: Oral Surgery;  Laterality: N/A;    Social History:  reports that she has never smoked. She has never used smokeless tobacco. She reports current alcohol use. She reports that she does not use  drugs.  Allergies:  Allergies  Allergen Reactions   Ciprofloxacin Dermatitis    "dr's think it may have caused mastitis"   Morphine And Related Nausea Only    Nausea and headache   Pork-Derived Products Shortness Of Breath and Nausea And Vomiting   Morphine Other (See Comments)    Headaches, itching, anger    (Not in a hospital admission)    Physical Exam: Blood pressure 106/87, pulse 93, temperature 97.7 F (36.5 C), temperature source Oral, resp. rate 17, weight 90.7 kg, last menstrual period 11/29/2016, SpO2 97 %. General: tearful, obese white female who is laying in bed HEENT: Forehead contusion noted.  Sclera are noninjected.  PERRL.  Ears and nose without any masses or lesions.  Mouth is pink and moist. Dentition fair Neck: No c-spine ttp or step offs. Able rom. Trachea midline. No stridor Heart: Tachycardic with regular rhythm.  Normal s1,s2. No obvious murmurs, gallops, or rubs noted.  Palpable radial and pedal pulses bilaterally  Lungs: CTAB, no wheezes, rhonchi, or rales noted.  Respiratory effort nonlabored Abd: Soft, obese, ND, +BS, no masses, hernias, or organomegaly. Patient with large left flank hematoma that is ttp MS: L wrist ttp. Otherwise NT over all major joints. MAE's. Trace b/l LE edema. Calves soft and nontender Skin: warm and dry with no masses, lesions, or rashes Psych: A&Ox4 with an appropriate affect Neuro: cranial nerves grossly intact, equal strength in BUE/BLE bilaterally, normal speech, thought process intact, moves all extremities, gait not assessed   Results for orders placed or performed during the hospital encounter of 09/03/21 (from the past 48 hour(s))  CBG monitoring, ED     Status: Abnormal   Collection Time: 09/03/21  2:38 AM  Result Value Ref Range   Glucose-Capillary >600 (HH) 70 - 99 mg/dL    Comment: Glucose reference range applies only to samples taken after fasting for at least 8 hours.  CBC with Differential     Status: Abnormal    Collection Time: 09/03/21  4:08 AM  Result Value Ref Range   WBC 16.8 (H) 4.0 - 10.5 K/uL   RBC 4.35 3.87 - 5.11 MIL/uL   Hemoglobin 13.1 12.0 - 15.0 g/dL   HCT 38.8 36.0 - 46.0 %   MCV 89.2 80.0 - 100.0 fL   MCH 30.1 26.0 - 34.0 pg   MCHC 33.8 30.0 - 36.0 g/dL   RDW 12.1 11.5 - 15.5 %   Platelets 331 150 - 400 K/uL   nRBC 0.0 0.0 - 0.2 %   Neutrophils Relative % 79 %   Neutro Abs 13.3 (H) 1.7 - 7.7 K/uL   Lymphocytes Relative 13 %   Lymphs Abs 2.1 0.7 - 4.0 K/uL   Monocytes Relative 7 %   Monocytes Absolute 1.2 (H) 0.1 - 1.0 K/uL   Eosinophils Relative 1 %   Eosinophils Absolute 0.1 0.0 - 0.5 K/uL   Basophils Relative  0 %   Basophils Absolute 0.1 0.0 - 0.1 K/uL   Immature Granulocytes 0 %   Abs Immature Granulocytes 0.07 0.00 - 0.07 K/uL    Comment: Performed at [facility], [place] [address], [place], [place] [address]  Comprehensive metabolic panel     Status: Abnormal   Collection Time: 09/03/21  4:08 AM  Result Value Ref Range   Sodium 127 (L) 135 - 145 mmol/L   Potassium 3.5 3.5 - 5.1 mmol/L   Chloride 93 (L) 98 - 111 mmol/L   CO2 24 22 - 32 mmol/L   Glucose, Bld 697 (HH) 70 - 99 mg/dL    Comment: Glucose reference range applies only to samples taken after fasting for at least 8 hours. CRITICAL RESULT CALLED TO, READ BACK BY AND VERIFIED WITH:  IELY BLAIR RN 09/03/21 @ 0453 VS    BUN 20 6 - 20 mg/dL   Creatinine, Ser 0.96 0.44 - 1.00 mg/dL   Calcium 9.3 8.9 - 10.3 mg/dL   Total Protein 6.9 6.5 - 8.1 g/dL   Albumin 3.6 3.5 - 5.0 g/dL   AST 99 (H) 15 - 41 U/L   ALT 58 (H) 0 - 44 U/L   Alkaline Phosphatase 187 (H) 38 - 126 U/L   Total Bilirubin 0.6 0.3 - 1.2 mg/dL   GFR, Estimated >60 >60 mL/min    Comment: (NOTE) Calculated using the CKD-EPI Creatinine Equation (2021)    Anion gap 10 5 - 15    Comment: Performed at [facility], [place] [address] [place], [place] [address]  Urinalysis, Routine w reflex microscopic Urine, Clean  Catch     Status: Abnormal   Collection Time: 09/03/21  4:08 AM  Result Value Ref Range   Color, Urine YELLOW YELLOW   APPearance CLEAR CLEAR   Specific Gravity, Urine <1.005 (L) 1.005 - 1.030   pH 5.5 5.0 - 8.0   Glucose, UA >1,000 (A) NEGATIVE mg/dL   Hgb urine dipstick TRACE (A) NEGATIVE   Bilirubin Urine NEGATIVE NEGATIVE   Ketones, ur 15 (A) NEGATIVE mg/dL   Protein, ur NEGATIVE NEGATIVE mg/dL   Nitrite POSITIVE (A) NEGATIVE   Leukocytes,Ua TRACE (A) NEGATIVE   RBC / HPF 6-10 0 - 5 RBC/hpf   WBC, UA 11-20 0 - 5 WBC/hpf   Bacteria, UA FEW (A) NONE SEEN   Squamous Epithelial / LPF 0-5 0 - 5   Mucus PRESENT     Comment: Performed at [facility], [place] [address], [place],  [address]  ABO/Rh     Status: None (Preliminary result)   Collection Time: 09/03/21  4:08 AM  Result Value Ref Range   ABO/RH(D) PENDING   I-Stat Chem 8, ED     Status: Abnormal   Collection Time: 09/03/21  4:17 AM  Result Value Ref Range   Sodium 131 (L) 135 - 145 mmol/L   Potassium 4.0 3.5 - 5.1 mmol/L   Chloride 95 (L) 98 - 111 mmol/L   BUN 23 (H) 6 - 20 mg/dL   Creatinine, Ser 0.90 0.44 - 1.00 mg/dL   Glucose, Bld 666 (HH) 70 - 99 mg/dL    Comment: Glucose reference range applies only to samples taken after fasting for at least 8 hours.   Calcium, Ion 1.24 1.15 - 1.40 mmol/L   TCO2 27 22 - 32 mmol/L   Hemoglobin 13.9 12.0 - 15.0 g/dL   HCT 41.0 36.0 - 46.0 %   Comment NOTIFIED  PHYSICIAN   Type and screen     Status: None   Collection Time: 09/03/21  5:45 AM  Result Value Ref Range   ABO/RH(D) O POS    Antibody Screen NEG    Sample Expiration      09/06/2021,2359 Performed at [facility] [address] [place], [place] [address]   Hemoglobin and hematocrit, blood     Status: Abnormal   Collection Time: 09/03/21  5:45 AM  Result Value Ref Range   Hemoglobin 12.0 12.0 - 15.0 g/dL   HCT 35.3 (L) 36.0 - 46.0 %    Comment: Performed at [facility], [place] [address], [place], [place] [address]  CBG monitoring, ED     Status: Abnormal   Collection Time: 09/03/21  6:53 AM  Result Value Ref Range   Glucose-Capillary 488 (H) 70 - 99 mg/dL    Comment: Glucose reference range applies only to samples taken after fasting for at least 8 hours.  Prepare RBC (crossmatch)     Status: None   Collection Time: 09/03/21  8:14 AM  Result Value Ref Range   Order Confirmation      ORDER PROCESSED BY BLOOD BANK Performed at [facility] [address], [place], [place] [address]    CT HEAD WO CONTRAST  Result Date: 09/03/2021 CLINICAL DATA:  51 year old female status post fall. Left flank pain and swelling. EXAM: CT HEAD WITHOUT CONTRAST TECHNIQUE: Contiguous axial images were obtained from the base of the skull through the vertex without intravenous contrast. RADIATION DOSE REDUCTION: This exam was performed according to the departmental dose-optimization program which includes automated exposure control, adjustment of the mA and/or kV according to patient size and/or use of iterative reconstruction technique. COMPARISON:  Face CT 11/11/2020. FINDINGS: Brain: Normal cerebral volume. No midline shift, ventriculomegaly, mass effect, evidence of mass lesion, intracranial hemorrhage or evidence of cortically based acute infarction. Gray-white matter differentiation is within normal limits throughout the brain. Vascular: Mild Calcified atherosclerosis at the skull base. No suspicious intracranial vascular hyperdensity., although the left MCA M1 is more visible than that on the right. Skull: No fracture identified. Sinuses/Orbits: Visualized paranasal sinuses and mastoids are stable and well aerated. Other: Small forehead scalp hematoma near the midline on series 3, image 43. Underlying frontal bones appear intact. Leftward gaze deviation. Orbits soft tissues appear negative. No other scalp soft tissue injury identified. IMPRESSION:  1. Forehead scalp hematoma without underlying skull fracture. 2. Normal noncontrast CT appearance of the brain. Electronically Signed   By: Genevie Ann M.D.   On: 09/03/2021 05:25   CT CERVICAL SPINE WO CONTRAST  Result Date: 09/03/2021 CLINICAL DATA:  51 year old female status post fall. Left flank pain and swelling. EXAM: CT CERVICAL SPINE WITHOUT CONTRAST TECHNIQUE: Multidetector CT imaging of the cervical spine was performed without intravenous contrast. Multiplanar CT image reconstructions were also generated. RADIATION DOSE REDUCTION: This exam was performed according to the departmental dose-optimization program which includes automated exposure control, adjustment of the mA and/or kV according to patient size and/or use of iterative reconstruction technique. COMPARISON:  Head CT today.  Face CT 11/11/2020. FINDINGS: Alignment: Rightward flexion of the neck today. Chronic straightening of cervical lordosis with mild chronic anterolisthesis of C3 on C4, stable. Cervicothoracic junction alignment is within normal limits. Bilateral posterior element alignment is within normal limits. Skull base and vertebrae: Visualized skull base is intact. No atlanto-occipital dissociation. C1-C2 alignment within normal limits for head position. Levels of chronic  ankylosis and advanced degeneration. No acute osseous abnormality identified. Soft tissues and spinal canal: No prevertebral fluid or swelling. No visible canal hematoma. Subcentimeter hypodense right thyroid nodule Not clinically significant; no follow-up imaging recommended (ref: J Am Coll Radiol. 2015 Feb;12(2): 143-50).Otherwise negative visible noncontrast neck soft tissues. Disc levels: Chronic C2-C3 posterior element ankylosis on the left. Chronic interbody and posterior element ankylosis at C4-C5. 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 Superimposed advanced cervical spine degeneration at the unfused  segments including C3-C4 with facet arthropathy and mild anterolisthesis, C5-C6 with vacuum disc, C7-T1 with facet arthropathy on the right. 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 Upper chest: Visible upper thoracic levels appear intact with small benign appearing sclerosis in the T3 vertebral body. Chest CT today is reported separately. IMPRESSION: 1. No acute traumatic injury identified in the cervical spine. 2. 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 3. CT Chest today is reported separately. Electronically Signed   By: Genevie Ann M.D.   On: 09/03/2021 05:30   CT CHEST ABDOMEN PELVIS WO CONTRAST  Result Date: 09/03/2021 CLINICAL DATA:  51 year old female status post fall. Left flank pain and swelling. EXAM: CT CHEST, ABDOMEN AND PELVIS WITHOUT CONTRAST TECHNIQUE: Multidetector CT imaging of the chest, abdomen and pelvis was performed following the standard protocol without IV contrast. RADIATION DOSE REDUCTION: This exam was performed according to the departmental dose-optimization program which includes automated exposure control, adjustment of the mA and/or kV according to patient size and/or use of iterative reconstruction technique. COMPARISON:  Cervical spine CT today. CT Abdomen and Pelvis 03/25/2017. FINDINGS: CT CHEST FINDINGS Cardiovascular: No cardiomegaly or pericardial effusion. Negative noncontrast appearance of the thoracic aorta and other central mediastinal vasculature. Mediastinum/Nodes: No mediastinal hematoma or lymphadenopathy is evident in the absence of IV contrast. Lungs/Pleura: Negative aside from minor dependent atelectasis greater on the right, with the patient somewhat right side decubitus position. Major airways are patent. Musculoskeletal:  Degeneration at the cervicothoracic junction. No acute osseous abnormality identified. Posterior left 11th and 12th ribs appear intact, but there is an overlying left posterior chest wall/flank heterogeneous somewhat high density soft tissue hematoma encompassing 81 by 76 by 119 mm (AP by transverse by CC) for an estimated volume of 360 mL. This is located in the subcutaneous layer, with regional inflammatory stranding and/or additional contusion. Underlying muscle layer mildly compressed but otherwise normal. CT ABDOMEN PELVIS FINDINGS Hepatobiliary: Absent gallbladder.  Negative noncontrast liver. Pancreas: Negative. Spleen: Negative. Adrenals/Urinary Tract: Normal adrenal glands. Nonobstructed kidneys. Unremarkable bladder. Occasional pelvic phleboliths, mostly on the right. No urinary calculus. Stomach/Bowel: Redundant large bowel with retained stool. Mild diverticulosis of the ascending colon and hepatic flexure, sigmoid colon at the anterior pelvis. Evidence of prior appendectomy. No large bowel inflammation. Negative terminal ileum. No dilated small bowel. Negative stomach and duodenum. No free air or free fluid. Vascular/Lymphatic: Normal caliber abdominal aorta. No calcified atherosclerosis or lymphadenopathy identified. Reproductive: Negative noncontrast appearance. Other: No pelvic free fluid. Musculoskeletal: Hematoma of the left flank extends from the left 11/12 rib lesions nearly to the iliac wing. Abundant regional subcutaneous stranding/contusion. No soft tissue gas. No other superficial soft tissue injury. Moderate dextroconvex lumbar scoliosis with advanced lumbar disc and endplate degeneration. No acute osseous abnormality identified. Sacrum, SI joints, pelvis and proximal femurs appear intact.  IMPRESSION: Confluent left posterior chest wall/flank subcutaneous Hematoma with estimated volume of 360 mL. Regional inflammatory stranding and/or hematoma infiltration. No underlying rib or pelvic  fracture. No other acute traumatic injury identified in the non-contrast chest, abdomen, or pelvis. Electronically Signed   By: Genevie Ann M.D.   On: 09/03/2021 05:39    Anti-infectives (From admission, onward)    None       Assessment/Plan Fall L Flank hematoma - Hypotensive on initial assessment. 1U PRBC now. BP improved. Trend Hgb. Repeat CT CAP w/ contrast now to see if any extrav that can be embolized. Abd binder.  L hand/wrist pain - xray Hyperglycemia in the setting of untreated DM2 - Patient reports she has not taken her DM medication in > 1 year. Glucose 697 on arrival. Given 10U insulin with improved to 488. Admit to ICU for insulin gtt. DM coordinator consult.  FEN - NPO, IVF, PRBC VTE - SCDs, on hold for above ID - None currently Foley - None currently. Has not voided since arrival. Bladder scan.  Dispo - Admit to inpatient. Transfer to [facility] ICU under trauma service.   This care required high  level of medical decision making.   Jillyn Ledger, [facility] Surgery 09/03/2021, 9:00 AM Please see Amion for pager number during day hours 7:00am-4:30pm

## 2021-09-03 NOTE — ED Provider Notes (Signed)
°  Physical Exam  BP 115/87    Pulse 94    Temp 97.7 F (36.5 C) (Oral)    Resp 16    Wt 90.7 kg    LMP 11/29/2016 (Approximate)    SpO2 97%    BMI 33.28 kg/m   Physical Exam Vitals and nursing note reviewed.  Constitutional:      General: She is not in acute distress.    Appearance: She is obese. She is not toxic-appearing.  HENT:     Mouth/Throat:     Mouth: Mucous membranes are moist.  Pulmonary:     Effort: Pulmonary effort is normal. No respiratory distress.  Abdominal:     General: Bowel sounds are normal.     Palpations: Abdomen is soft.  Musculoskeletal:     Comments: Severe tenderness to palpation of the left flank. Noticeable swelling, but no overlying skin changes.     Procedures  Procedures  ED Course / MDM    Medical Decision Making Amount and/or Complexity of Data Reviewed Labs: ordered. Radiology: ordered.  Risk Prescription drug management. Decision regarding hospitalization.   Accepted handoff at shift change from [place], [place]. Please see prior provider note for more detail.   Briefly: Patient is 51 y.o. F presenting to the ED for evaluation of flank pain s/p slip and fall today.   DDX: concern for surgical need for hematoma  Plan: Consult surgery  The patient is a insulin dependent diabetic that has been managing her diabetes with her diet instead of insulin. She presented for head and flank pain. CT of head, neck and abdomen showed a forehead scalp hematoma without underlying fracture, no acute trauma to the neck just degenerative changes, CT Chest/abdomen/pelvis shows confluent left posterior chest wall/flank subcutaneous Hematoma with estimated volume of 360 mL. Regional inflammatory stranding and/or hematoma infiltration. No underlying rib or pelvic fracture.  At this time, the patient has received 1L of fluids and 10u on insulin. She is less tachycardiac and her glucose has improved to 488. The patient reports her pain is better controlled with the  ketamine.   The patients BP showed hypotension, but after cuff replacement and some additional fluids, she is now normotensive.  Spoke with general surgery and trauma surgery for this patient who are assessing at bedside. Requesting 1 unit of blood. I discussed the risks and benefits of blood tranfusion with the patient including adverse reaction, Hepatitis, and HIV although extremely rare. The patient agrees to blood tranfusion. Order placed.   Trauma team is recommending admission to the ICU at [facility] and ordered CT chest, abdomen, pelvis with contrast. Awaiting transfer at this time.        Sherrell Puller, PA-C 09/03/21 1136    Hayden Rasmussen, MD 09/03/21 [phone]

## 2021-09-04 LAB — CBC
HCT: 32.2 % — ABNORMAL LOW (ref 36.0–46.0)
Hemoglobin: 10.5 g/dL — ABNORMAL LOW (ref 12.0–15.0)
MCH: 29.6 pg (ref 26.0–34.0)
MCHC: 32.6 g/dL (ref 30.0–36.0)
MCV: 90.7 fL (ref 80.0–100.0)
Platelets: 267 10*3/uL (ref 150–400)
RBC: 3.55 MIL/uL — ABNORMAL LOW (ref 3.87–5.11)
RDW: 12.4 % (ref 11.5–15.5)
WBC: 5 10*3/uL (ref 4.0–10.5)
nRBC: 0 % (ref 0.0–0.2)

## 2021-09-04 LAB — TYPE AND SCREEN
ABO/RH(D): O POS
Antibody Screen: NEGATIVE

## 2021-09-04 LAB — BASIC METABOLIC PANEL
Anion gap: 6 (ref 5–15)
BUN: 8 mg/dL (ref 6–20)
CO2: 30 mmol/L (ref 22–32)
Calcium: 8.8 mg/dL — ABNORMAL LOW (ref 8.9–10.3)
Chloride: 102 mmol/L (ref 98–111)
Creatinine, Ser: 0.82 mg/dL (ref 0.44–1.00)
GFR, Estimated: >60 mL/min (ref >60)
Glucose, Bld: 162 mg/dL — ABNORMAL HIGH (ref 70–99)
Potassium: 3.5 mmol/L (ref 3.5–5.1)
Sodium: 138 mmol/L (ref 135–145)

## 2021-09-04 LAB — GLUCOSE, CAPILLARY
Glucose-Capillary: 172 mg/dL — ABNORMAL HIGH (ref 70–99)
Glucose-Capillary: 197 mg/dL — ABNORMAL HIGH (ref 70–99)
Glucose-Capillary: 247 mg/dL — ABNORMAL HIGH (ref 70–99)
Glucose-Capillary: 288 mg/dL — ABNORMAL HIGH (ref 70–99)
Glucose-Capillary: 310 mg/dL — ABNORMAL HIGH (ref 70–99)
Glucose-Capillary: 375 mg/dL — ABNORMAL HIGH (ref 70–99)

## 2021-09-04 MED ORDER — OXYCODONE HCL 5 MG PO TABS
2.5000 mg | ORAL_TABLET | ORAL | Status: DC | PRN
  Administered 2021-09-04 – 2021-09-06 (×7): 2.5 mg via ORAL
  Filled 2021-09-04 (×7): qty 1

## 2021-09-04 MED ORDER — INSULIN ASPART 100 UNIT/ML IJ SOLN
0.0000 [IU] | Freq: Every day | INTRAMUSCULAR | Status: DC
  Administered 2021-09-04: 5 [IU] via SUBCUTANEOUS
  Administered 2021-09-05: 4 [IU] via SUBCUTANEOUS

## 2021-09-04 MED ORDER — INSULIN ASPART PROT & ASPART (70-30 MIX) 100 UNIT/ML ~~LOC~~ SUSP
10.0000 [IU] | Freq: Two times a day (BID) | SUBCUTANEOUS | Status: DC
  Administered 2021-09-04 (×2): 10 [IU] via SUBCUTANEOUS
  Filled 2021-09-04: qty 10

## 2021-09-04 MED ORDER — TRAMADOL HCL 50 MG PO TABS: 50.0000 mg | ORAL_TABLET | Freq: Four times a day (QID) | ORAL | Status: DC | PRN

## 2021-09-04 MED ORDER — INSULIN ASPART 100 UNIT/ML IJ SOLN
0.0000 [IU] | Freq: Three times a day (TID) | INTRAMUSCULAR | Status: DC
  Administered 2021-09-04: 3 [IU] via SUBCUTANEOUS
  Administered 2021-09-04: 7 [IU] via SUBCUTANEOUS
  Administered 2021-09-04: 2 [IU] via SUBCUTANEOUS
  Administered 2021-09-05 (×2): 7 [IU] via SUBCUTANEOUS
  Administered 2021-09-05: 5 [IU] via SUBCUTANEOUS
  Administered 2021-09-06: 7 [IU] via SUBCUTANEOUS

## 2021-09-04 MED ORDER — INSULIN ASPART PROT & ASPART (70-30 MIX) 100 UNIT/ML ~~LOC~~ SUSP: 10.0000 [IU] | Freq: Two times a day (BID) | SUBCUTANEOUS | Status: DC

## 2021-09-04 NOTE — Progress Notes (Signed)
Subjective: Soft SBP 80s-90s overnight after getting dilaudid. Hgb stable this morning. Patient still having significant pain in her left flank.   Objective: Vital signs in last 24 hours: Temp:  [97.7 F (36.5 C)-98.9 F (37.2 C)] 97.9 F (36.6 C) (02/04 0800) Pulse Rate:  [84-109] 85 (02/04 0800) Resp:  [11-23] 15 (02/04 0800) BP: (81-128)/(57-96) 94/62 (02/04 0600) SpO2:  [91 %-100 %] 95 % (02/04 0800) FiO2 (%):  [21 %] 21 % (02/03 0903) Last BM Date: 09/02/21  Intake/Output from previous day: 02/03 0701 - 02/04 0700 In: -  Out: 325 [Urine:325] Intake/Output this shift: No intake/output data recorded.  PE: General: resting comfortably, NAD Neuro: alert and oriented, no focal deficits Resp: normal work of breathing on room air CV: RRR Abdomen: soft, nondistended. L flank ecchymosis is stable. Extremities: warm and well-perfused   Lab Results:  Recent Labs    09/03/21 1401 09/04/21 0739  WBC 6.9 5.0  HGB 10.9* 10.5*  HCT 32.4* 32.2*  PLT 281 267   BMET Recent Labs    09/03/21 0408 09/03/21 0417 09/04/21 0739  NA 127* 131* 138  K 3.5 4.0 3.5  CL 93* 95* 102  CO2 24  --  30  GLUCOSE 697* 666* 162*  BUN 20 23* 8  CREATININE 0.96 0.90 0.82  CALCIUM 9.3  --  8.8*   PT/INR No results for input(s): LABPROT, INR in the last 72 hours. CMP     Component Value Date/Time   NA 138 09/04/2021 0739   K 3.5 09/04/2021 0739   CL 102 09/04/2021 0739   CO2 30 09/04/2021 0739   GLUCOSE 162 (H) 09/04/2021 0739   BUN 8 09/04/2021 0739   CREATININE 0.82 09/04/2021 0739   CREATININE 0.80 01/18/2012 1013   CALCIUM 8.8 (L) 09/04/2021 0739   PROT 6.9 09/03/2021 0408   ALBUMIN 3.6 09/03/2021 0408   AST 99 (H) 09/03/2021 0408   ALT 58 (H) 09/03/2021 0408   ALKPHOS 187 (H) 09/03/2021 0408   BILITOT 0.6 09/03/2021 0408   GFRNONAA >60 09/04/2021 0739   GFRAA >60 08/01/2019 1954   Lipase     Component Value Date/Time   LIPASE 24 03/25/2017 1257        Studies/Results: CT HEAD WO CONTRAST  Result Date: 09/03/2021 CLINICAL DATA:  51 year old female status post fall. Left flank pain and swelling. EXAM: CT HEAD WITHOUT CONTRAST TECHNIQUE: Contiguous axial images were obtained from the base of the skull through the vertex without intravenous contrast. RADIATION DOSE REDUCTION: This exam was performed according to the departmental dose-optimization program which includes automated exposure control, adjustment of the mA and/or kV according to patient size and/or use of iterative reconstruction technique. COMPARISON:  Face CT 11/11/2020. FINDINGS: Brain: Normal cerebral volume. No midline shift, ventriculomegaly, mass effect, evidence of mass lesion, intracranial hemorrhage or evidence of cortically based acute infarction. Gray-white matter differentiation is within normal limits throughout the brain. Vascular: Mild Calcified atherosclerosis at the skull base. No suspicious intracranial vascular hyperdensity., although the left MCA M1 is more visible than that on the right. Skull: No fracture identified. Sinuses/Orbits: Visualized paranasal sinuses and mastoids are stable and well aerated. Other: Small forehead scalp hematoma near the midline on series 3, image 43. Underlying frontal bones appear intact. Leftward gaze deviation. Orbits soft tissues appear negative. No other scalp soft tissue injury identified. IMPRESSION: 1. Forehead scalp hematoma without underlying skull fracture. 2. Normal noncontrast CT appearance of the brain. Electronically Signed  By: Genevie Ann M.D.   On: 09/03/2021 05:25   CT CERVICAL SPINE WO CONTRAST  Result Date: 09/03/2021 CLINICAL DATA:  51 year old female status post fall. Left flank pain and swelling. EXAM: CT CERVICAL SPINE WITHOUT CONTRAST TECHNIQUE: Multidetector CT imaging of the cervical spine was performed without intravenous contrast. Multiplanar CT image reconstructions were also generated. RADIATION DOSE REDUCTION:  This exam was performed according to the departmental dose-optimization program which includes automated exposure control, adjustment of the mA and/or kV according to patient size and/or use of iterative reconstruction technique. COMPARISON:  Head CT today.  Face CT 11/11/2020. FINDINGS: Alignment: Rightward flexion of the neck today. Chronic straightening of cervical lordosis with mild chronic anterolisthesis of C3 on C4, stable. Cervicothoracic junction alignment is within normal limits. Bilateral posterior element alignment is within normal limits. Skull base and vertebrae: Visualized skull base is intact. No atlanto-occipital dissociation. C1-C2 alignment within normal limits for head position. Levels of chronic ankylosis and advanced degeneration. No acute osseous abnormality identified. Soft tissues and spinal canal: No prevertebral fluid or swelling. No visible canal hematoma. Subcentimeter hypodense right thyroid nodule Not clinically significant; no follow-up imaging recommended (ref: J Am Coll Radiol. 2015 Feb;12(2): 143-50).Otherwise negative visible noncontrast neck soft tissues. Disc levels: Chronic C2-C3 posterior element ankylosis on the left. Chronic interbody and posterior element ankylosis at C4-C5. 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 Superimposed advanced cervical spine degeneration at the unfused segments including C3-C4 with facet arthropathy and mild anterolisthesis, C5-C6 with vacuum disc, C7-T1 with facet arthropathy on the right. 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 Upper chest: Visible upper thoracic levels appear intact with small benign appearing sclerosis in the T3 vertebral body. Chest CT today is reported separately. IMPRESSION: 1. No acute traumatic injury identified in the cervical spine. 2. 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 3. CT Chest today is reported separately. Electronically Signed   By: Genevie Ann M.D.   On: 09/03/2021 05:30   CT CHEST ABDOMEN PELVIS W CONTRAST  Result Date: 09/03/2021 CLINICAL DATA:  Fall earlier today. Left flank pain. Poly trauma. Leukocytosis. EXAM: CT CHEST, ABDOMEN, AND PELVIS WITH CONTRAST TECHNIQUE: Multidetector CT imaging of the chest, abdomen and pelvis was performed following the standard protocol during bolus administration of intravenous contrast. RADIATION DOSE REDUCTION: This exam was performed according to the departmental dose-optimization program which includes automated exposure control, adjustment of the mA and/or kV according to patient size and/or use of iterative reconstruction technique. CONTRAST:  176mL OMNIPAQUE IOHEXOL 300 MG/ML  SOLN COMPARISON:  Chest abdomen pelvis CT earlier today. FINDINGS: CT CHEST FINDINGS Cardiovascular: The heart size is normal. No substantial pericardial effusion. No thoracic aortic aneurysm. Mediastinum/Nodes: No mediastinal lymphadenopathy. There is no hilar lymphadenopathy. The esophagus has normal imaging features. There is no axillary lymphadenopathy. Lungs/Pleura: No pneumothorax or pleural effusion. No suspicious nodule or mass. No focal airspace consolidation. Dependent atelectasis in the lower lobes bilaterally. Musculoskeletal: No evidence for rib fracture. Sternum intact. No thoracic spine fracture evident. CT ABDOMEN PELVIS FINDINGS Hepatobiliary: No suspicious focal abnormality within the liver parenchyma. Gallbladder surgically absent. No intrahepatic or extrahepatic biliary dilation. Pancreas: No focal mass lesion. No dilatation of the main duct. No intraparenchymal cyst. No peripancreatic edema. Spleen: No splenomegaly. No focal mass lesion. Adrenals/Urinary Tract: No  adrenal nodule or mass.  Kidneys unremarkable. No evidence for hydroureter. Bladder is distended. Stomach/Bowel: Stomach is unremarkable. No gastric wall thickening. No evidence of outlet obstruction. Duodenum is normally positioned as is the ligament of Treitz. No small bowel wall thickening. No small bowel dilatation. The terminal ileum is normal. The appendix is not well visualized, but there is no edema or inflammation in the region of the cecum. No gross colonic mass. No colonic wall thickening. Diverticular changes are noted in the left colon without evidence of diverticulitis. Vascular/Lymphatic: No abdominal aortic aneurysm. No abdominal lymphadenopathy. No pelvic sidewall lymphadenopathy. Reproductive: Prominent nabothian cyst noted in the cervix. There is no adnexal mass. Other: No intraperitoneal free fluid. Musculoskeletal: No worrisome lytic or sclerotic osseous abnormality. 10.8 x 6.7 x 7.2 cm hematoma identified in the subcutaneous tissues of the left flank region not substantially changed from 11.9 x 7.6 x 8.1 cm on the CT earlier today. Overlying edema is identified in the subcutaneous fat. No evidence for acute fracture involving the bony anatomy of the abdomen and pelvis. As noted on prior CT, no posterior lower left rib fracture evident. IMPRESSION: 1. No new findings in the chest, abdomen, or pelvis. 2. 10.8 x 6.7 x 7.2 cm "mass" in the left flank region compatible with hematoma in the subcutaneous tissues. This is not substantially changed from earlier today. No findings to suggest active or ongoing bleeding at this location on the current postcontrast study. Overlying edema is identified in the subcutaneous fat. 3. Left colonic diverticulosis without diverticulitis. Electronically Signed   By: Misty Stanley M.D.   On: 09/03/2021 09:22   CT CHEST ABDOMEN PELVIS WO CONTRAST  Result Date: 09/03/2021 CLINICAL DATA:  51 year old female status post fall. Left flank pain and swelling. EXAM: CT CHEST, ABDOMEN AND PELVIS  WITHOUT CONTRAST TECHNIQUE: Multidetector CT imaging of the chest, abdomen and pelvis was performed following the standard protocol without IV contrast. RADIATION DOSE REDUCTION: This exam was performed according to the departmental dose-optimization program which includes automated exposure control, adjustment of the mA and/or kV according to patient size and/or use of iterative reconstruction technique. COMPARISON:  Cervical spine CT today. CT Abdomen and Pelvis 03/25/2017. FINDINGS: CT CHEST FINDINGS Cardiovascular: No cardiomegaly or pericardial effusion. Negative noncontrast appearance of the thoracic aorta and other central mediastinal vasculature. Mediastinum/Nodes: No mediastinal hematoma or lymphadenopathy is evident in the absence of IV contrast. Lungs/Pleura: Negative aside from minor dependent atelectasis greater on the right, with the patient somewhat right side decubitus position. Major airways are patent. Musculoskeletal: Degeneration at the cervicothoracic junction. No acute osseous abnormality identified. Posterior left 11th and 12th ribs appear intact, but there is an overlying left posterior chest wall/flank heterogeneous somewhat high density soft tissue hematoma encompassing 81 by 76 by 119 mm (AP by transverse by CC) for an estimated volume of 360 mL. This is located in the subcutaneous layer, with regional inflammatory stranding and/or additional contusion. Underlying muscle layer mildly compressed but otherwise normal. CT ABDOMEN PELVIS FINDINGS Hepatobiliary: Absent gallbladder.  Negative noncontrast liver. Pancreas: Negative. Spleen: Negative. Adrenals/Urinary Tract: Normal adrenal glands. Nonobstructed kidneys. Unremarkable bladder. Occasional pelvic phleboliths, mostly on the right. No urinary calculus. Stomach/Bowel: Redundant large bowel with retained stool. Mild diverticulosis of the ascending colon and hepatic flexure, sigmoid colon at the anterior pelvis. Evidence of prior  appendectomy. No large bowel inflammation. Negative terminal ileum. No dilated small bowel. Negative stomach and duodenum. No free air or free fluid. Vascular/Lymphatic: Normal caliber abdominal aorta. No calcified atherosclerosis  or lymphadenopathy identified. Reproductive: Negative noncontrast appearance. Other: No pelvic free fluid. Musculoskeletal: Hematoma of the left flank extends from the left 11/12 rib lesions nearly to the iliac wing. Abundant regional subcutaneous stranding/contusion. No soft tissue gas. No other superficial soft tissue injury. Moderate dextroconvex lumbar scoliosis with advanced lumbar disc and endplate degeneration. No acute osseous abnormality identified. Sacrum, SI joints, pelvis and proximal femurs appear intact. IMPRESSION: Confluent left posterior chest wall/flank subcutaneous Hematoma with estimated volume of 360 mL. Regional inflammatory stranding and/or hematoma infiltration. No underlying rib or pelvic fracture. No other acute traumatic injury identified in the non-contrast chest, abdomen, or pelvis. Electronically Signed   By: Genevie Ann M.D.   On: 09/03/2021 05:39    Anti-infectives: Anti-infectives (From admission, onward)    None        Assessment/Plan 51 yo female s/p fall with left flank hematoma. - Hgb stable today  - DM2: DM coordinator consulted. Novolin 70/30 BID ordered with SSI correction ACHS. Will to arrange outpatient follow up with a PCP. - Carb modified diet, SLIV - Multimodal pain control: prn tramadol, scheduled tylenol and robaxin - VTE: SCDs, chemical DVT ppx on hold - Dispo: transfer to progressive care    LOS: 1 day    Michaelle Birks, MD [facility] General, Hepatobiliary and Pancreatic Surgery 09/04/21 8:45 AM

## 2021-09-04 NOTE — TOC CAGE-AID Note (Signed)
Transition of Care [facility]) - CAGE-AID Screening   Patient Details  Name: Karen Roberts MRN: [id number] Date of Birth: [birth year]  [facility] CM/SW Contact:    Alfredia Ferguson, LCSW Phone Number: 09/04/2021, 10:04 AM   Clinical Narrative: CSW met with patient for CAGE/AID screen per trauma protocol. CSW noted patient declined any substance use current or historical. CSW and patient discussed patient's goal of becoming a substance use counselor and working to help another person in her life with substance use. CSW noted patient would be open to supportive counseling for stress/trauma not related to substance. CSW will add resources to patient's AVS.   CAGE-AID Screening:    Have You Ever Felt You Ought to Cut Down on Your Drinking or Drug Use?: No Have People Annoyed You By Critizing Your Drinking Or Drug Use?: No Have You Felt Bad Or Guilty About Your Drinking Or Drug Use?: No Have You Ever Had a Drink or Used Drugs First Thing In The Morning to Steady Your Nerves or to Get Rid of a Hangover?: No CAGE-AID Score: 0  Substance Abuse Education Offered: No

## 2021-09-04 NOTE — Progress Notes (Signed)
Inpatient Diabetes Program Recommendations  AACE/ADA: New Consensus Statement on Inpatient Glycemic Control (2015)  Target Ranges:  Prepandial:   less than 140 mg/dL      Peak postprandial:   less than 180 mg/dL (1-2 hours)      Critically ill patients:  140 - 180 mg/dL   Lab Results  Component Value Date   GLUCAP 247 (H) 09/04/2021   HGBA1C 12.4 (H) 11/11/2020    Review of Glycemic Control  Latest Reference Range & Units 09/04/21 06:06 09/04/21 07:37 09/04/21 11:31  Glucose-Capillary 70 - 99 mg/dL 197 (H) 172 (H) 247 (H)  (H): Data is abnormally high  Current orders for Inpatient glycemic control: 70/30 10 units BID, Novolog 0-9 units TID  Inpatient Diabetes Program Recommendations:    70/30 12 units BID  Will continue to follow while inpatient.  Thank you, Reche Dixon, MSN, RN Diabetes Coordinator Inpatient Diabetes Program [phone] (team pager from 8a-5p)

## 2021-09-05 DIAGNOSIS — D62 Acute posthemorrhagic anemia: Secondary | ICD-10-CM

## 2021-09-05 DIAGNOSIS — S301XXA Contusion of abdominal wall, initial encounter: Secondary | ICD-10-CM

## 2021-09-05 DIAGNOSIS — E1165 Type 2 diabetes mellitus with hyperglycemia: Secondary | ICD-10-CM | POA: Diagnosis present

## 2021-09-05 DIAGNOSIS — Y92009 Unspecified place in unspecified non-institutional (private) residence as the place of occurrence of the external cause: Secondary | ICD-10-CM

## 2021-09-05 DIAGNOSIS — W19XXXA Unspecified fall, initial encounter: Secondary | ICD-10-CM

## 2021-09-05 DIAGNOSIS — R7401 Elevation of levels of liver transaminase levels: Secondary | ICD-10-CM | POA: Diagnosis present

## 2021-09-05 DIAGNOSIS — E669 Obesity, unspecified: Secondary | ICD-10-CM

## 2021-09-05 LAB — GLUCOSE, CAPILLARY
Glucose-Capillary: 322 mg/dL — ABNORMAL HIGH (ref 70–99)
Glucose-Capillary: 270 mg/dL — ABNORMAL HIGH (ref 70–99)
Glucose-Capillary: 303 mg/dL — ABNORMAL HIGH (ref 70–99)
Glucose-Capillary: 326 mg/dL — ABNORMAL HIGH (ref 70–99)

## 2021-09-05 LAB — URINE CULTURE: Culture: >=100000 — AB

## 2021-09-05 MED ORDER — INSULIN ASPART 100 UNIT/ML IJ SOLN
12.0000 [IU] | Freq: Every day | INTRAMUSCULAR | Status: DC
  Administered 2021-09-05: 12 [IU] via SUBCUTANEOUS

## 2021-09-05 MED ORDER — FLUCONAZOLE 200 MG PO TABS
200.0000 mg | ORAL_TABLET | Freq: Once | ORAL | Status: AC
  Administered 2021-09-05: 200 mg via ORAL
  Filled 2021-09-05: qty 1

## 2021-09-05 MED ORDER — FONDAPARINUX SODIUM 2.5 MG/0.5ML ~~LOC~~ SOLN
2.5000 mg | SUBCUTANEOUS | Status: DC
  Administered 2021-09-05: 2.5 mg via SUBCUTANEOUS
  Filled 2021-09-05 (×2): qty 0.5

## 2021-09-05 MED ORDER — INSULIN ASPART 100 UNIT/ML IJ SOLN: 12.0000 [IU] | Freq: Every day | INTRAMUSCULAR | Status: DC

## 2021-09-05 MED ORDER — INSULIN ASPART PROT & ASPART (70-30 MIX) 100 UNIT/ML ~~LOC~~ SUSP: 12.0000 [IU] | Freq: Two times a day (BID) | SUBCUTANEOUS | Status: DC

## 2021-09-05 MED ORDER — INSULIN ASPART 100 UNIT/ML IJ SOLN: 14.0000 [IU] | Freq: Every day | INTRAMUSCULAR | Status: DC

## 2021-09-05 MED ORDER — INSULIN ASPART 100 UNIT/ML IJ SOLN
14.0000 [IU] | Freq: Every day | INTRAMUSCULAR | Status: DC
  Administered 2021-09-05: 14 [IU] via SUBCUTANEOUS

## 2021-09-05 MED ORDER — LORAZEPAM 1 MG PO TABS
1.0000 mg | ORAL_TABLET | Freq: Two times a day (BID) | ORAL | Status: DC | PRN
  Administered 2021-09-05: 1 mg via ORAL
  Filled 2021-09-05: qty 1

## 2021-09-05 MED ORDER — INSULIN ASPART PROT & ASPART (70-30 MIX) 100 UNIT/ML ~~LOC~~ SUSP: 14.0000 [IU] | Freq: Two times a day (BID) | SUBCUTANEOUS | Status: DC

## 2021-09-05 NOTE — Evaluation (Signed)
Physical Therapy Evaluation Patient Details Name: Karen Roberts MRN: [id number] DOB: [birth year] Today's Date: 09/05/2021  History of Present Illness  51 y/o female presented to ED on 09/03/21 after fall with severe L flank pain. Found to have L flank hematoma and L hand/wrist pain (awaiting x-ray). PMH: diabetes  Clinical Impression  Patient admitted with above findings following fall. Patient functioning at modI for mobility with no AD but overall limited by pain. Patient declined stairs due to being off unit. Patient functioning at baseline. No skilled PT needs identified acutely. No PT follow up recommended at this time.        Recommendations for follow up therapy are one component of a multi-disciplinary discharge planning process, led by the attending physician.  Recommendations may be updated based on patient status, additional functional criteria and insurance authorization.  Follow Up Recommendations No PT follow up    Assistance Recommended at Discharge PRN  Patient can return home with the following       Equipment Recommendations None recommended by PT  Recommendations for Other Services       Functional Status Assessment Patient has not had a recent decline in their functional status     Precautions / Restrictions Precautions Precautions: Fall Restrictions Weight Bearing Restrictions: No      Mobility  Bed Mobility Overal bed mobility: Modified Independent                  Transfers Overall transfer level: Modified independent Equipment used: None                    Ambulation/Gait Ambulation/Gait assistance: Modified independent (Device/Increase time) Gait Distance (Feet): 250 Feet Assistive device: None Gait Pattern/deviations: Step-through pattern Gait velocity: decreased     General Gait Details: slow pace due to pain in L flank  Stairs Stairs:  (refused mobility out of unit due to "it's dirty and I don't want to go back to bed  with dirty socks")          Wheelchair Mobility    Modified Rankin (Stroke Patients Only)       Balance Overall balance assessment: No apparent balance deficits (not formally assessed)                                           Pertinent Vitals/Pain Pain Assessment Pain Assessment: Faces Faces Pain Scale: Hurts even more Pain Location: L flank Pain Descriptors / Indicators: Grimacing, Guarding Pain Intervention(s): Monitored during session, Repositioned    Home Living Family/patient expects to be discharged to:: Private residence Living Arrangements: Spouse/significant other Available Help at Discharge: Family;Available PRN/intermittently Type of Home: House Home Access: Stairs to enter Entrance Stairs-Rails: None Entrance Stairs-Number of Steps: 4-5   Home Layout: One level Home Equipment: None      Prior Function Prior Level of Function : Independent/Modified Independent;History of Falls (last six months)             Mobility 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        Extremity/Trunk Assessment   Upper Extremity Assessment Upper Extremity Assessment: Overall WFL for tasks assessed    Lower Extremity Assessment Lower Extremity Assessment: Overall WFL for tasks assessed    Cervical / Trunk Assessment Cervical / Trunk Assessment: Normal  Communication   Communication: No difficulties  Cognition Arousal/Alertness: Awake/alert Behavior During Therapy: WFL for tasks assessed/performed Overall Cognitive Status: Within Functional Limits for tasks assessed                                          General Comments      Exercises     Assessment/Plan    PT Assessment Patient does not need any further PT services  PT Problem List         PT Treatment Interventions      PT Goals (Current goals can be found in the Care Plan section)  Acute Rehab PT Goals Patient Stated Goal: to  reduce pain PT Goal Formulation: All assessment and education complete, DC therapy    Frequency       Co-evaluation               AM-PAC PT "6 Clicks" Mobility  Outcome Measure Help needed turning from your back to your side while in a flat bed without using bedrails?: None Help needed moving from lying on your back to sitting on the side of a flat bed without using bedrails?: None Help needed moving to and from a bed to a chair (including a wheelchair)?: None Help needed standing up from a chair using your arms (e.g., wheelchair or bedside chair)?: None Help needed to walk in hospital room?: None Help needed climbing 3-5 steps with a railing? : None 6 Click Score: 24    End of Session   Activity Tolerance: Patient tolerated treatment well Patient left: in bed;with bed alarm set;with call bell/phone within reach Nurse Communication: Mobility status PT Visit Diagnosis: Unsteadiness on feet (R26.81)    Time: 0912-0928 PT Time Calculation (min) (ACUTE ONLY): 16 min   Charges:   PT Evaluation $PT Eval Low Complexity: 1 Low          Kraig Genis A. Gilford Rile PT, DPT Acute Rehabilitation Services Pager [phone] Office [phone]   Linna Hoff 09/05/2021, 9:34 AM

## 2021-09-05 NOTE — Assessment & Plan Note (Addendum)
-  BMI 33.28 kg/m2.  She would benefit from weight loss

## 2021-09-05 NOTE — Assessment & Plan Note (Addendum)
-  Labs significant for alkaline phosphatase to 187, AST 99, and ALT 58 on admission.  Review of records notes that these levels have been chronically elevated and has documented hepatic steatosis. Recommend outpatient follow-up for additional work-up

## 2021-09-05 NOTE — Consult Note (Addendum)
Initial Consultation Note   Patient: Lynell Greenhouse VFI:[id number] DOB: [birth year] PCP: Patient, No Pcp Per (Inactive) DOA: 09/03/2021 DOS: the patient was seen and examined on 09/05/2021 Primary service: Md, Trauma, MD  Referring physician: Michaelle Birks, MD Reason for consult: Diabetes management  Assessment/Plan: Ms. Hassan is a 51 year old lady with past medical history of diabetes mellitus type 2, hepatic steatosis, and obesity  Assessment and Plan: Hematoma of left flank secondary to fall with acute blood loss anemia- (present on admission) Since fall hemoglobins have trended down but currently stable at 10.5 g/dL last checked 2/4. - Per surgery  Uncontrolled type 2 diabetes mellitus with hyperglycemia, without long-term current use of insulin ([place])- (present on admission) Patient presented initially with blood sugars elevated to 697 without elevated anion gap or signs of DKA.  Previously on metformin but had been off medication for over a year due to diarrhea.  Hemoglobin A1c previously 12.4 in 10/2020.  On NovoLog 70/30 insulin 10 units twice daily with meals-sensitive sliding scale insulin with meals nightly blood sugar still not well controlled.  Appreciate diabetic education recommendations of increasing NovoLog 70/30 insulin increased to 12 units twice daily. -Follow-up hemoglobin A1c -Will increase morning 70/30 insulin to 14 units in and agree with 12 units in the afternoon -Continue CBGs before every meal and nightly with sensitive SSI while in the hospital. -Appreciate diabetic education.  Would asked that they come back and show the patient had a give herself insulin as she reports at this time feeling uncomfortable doing so. -Ultimately patient would benefit from being on a simplified regimen of the 70/30 insulin twice daily if blood sugars are better controlled and to follow-up in outpatient setting to have it tailored.  Patient request the flex pens, but unsure is a option  given various that patient lacked insurance with vouchers. -Continue to stress with the patient the need to follow-up with community health and wellness and to check blood sugars regularly   Obesity (BMI 30-39.9) BMI 33.28 kg/m2.   Transaminitis- (present on admission) Labs significant for alkaline phosphatase to 187, AST 99, and ALT 58 on admission.  Review of records notes that these levels have been chronically elevated and has documented hepatic steatosis.  -Recommend outpatient follow-up for additional work-up       TRH will continue to follow the patient.  HPI: Jisel Fleet is a 51 y.o. female with past medical history of diabetes mellitus type 2, hepatic steatosis, and obesity who presents after having a fall 2 days ago.  She was at home in her garage at the time talking to her husband when she slipped on water and fell backwards onto her left side.  She hit her head as well, but did not lose consciousness.  Work-up revealed left posterior flank subcutaneous hematoma 360 mL.  Initially while still in the ED patient was noted to be hypotensive with systolics blood pressure in 70-80s after having received ketamine, but had been ordered 1 unit of PRBCs.  Hemoglobin 13.1-> 12-> 10.9-> 10.5.  Blood sugars were initially elevated to 697 without elevated anion gap.  She had been given insulin and diabetic education consulted.  It seems that the patient had previously been on metformin over a year ago, but stopped the medication due to side effects of diarrhea.  Blood sugars are still intermittently obtained in the 200-300s despite being started on Novolin 70/30 insulin 10 units twice daily and sensitive sliding scale insulin with meals.  She has intermittently experienced pins and needle  sensation in her extremities.  She had not shown how to use medications with diabetic education of the test not feel comfortable at this time giving herself insulin.  If possible she would want to be given  insulin pens.  At this time she does not have a primary care provider with whom to follow up with and lacks insurance.  She still reports having significant pain in her lower flank from where she fell.   Review of Systems: As mentioned in the history of present illness. All other systems reviewed and are negative. Past Medical History:  Diagnosis Date   Allergy    Anxiety    Diabetes mellitus without complication ([place])    NATFTDDU(202.5)    "every once in awhile"   Hepatic steatosis 02/24/11   Ligament tear    left; "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   PONV (postoperative nausea and vomiting)    Postoperative bile leak    Sepsis [facility])    Past Surgical History:  Procedure Laterality Date   APPENDECTOMY  2011   CESAREAN SECTION  2000   CHOLECYSTECTOMY  11/29/2011   Procedure: LAPAROSCOPIC CHOLECYSTECTOMY WITH INTRAOPERATIVE CHOLANGIOGRAM;  Surgeon: Imogene Burn. Georgette Dover, MD;  Location: [place];  Service: General;  Laterality: N/A;   ERCP  12/14/2011   Procedure: ENDOSCOPIC RETROGRADE CHOLANGIOPANCREATOGRAPHY (ERCP);  Surgeon: Gatha Mayer, MD;  Location: [place];  Service: Endoscopy;  Laterality: N/A;   ERCP  01/05/2012   Procedure: ENDOSCOPIC RETROGRADE CHOLANGIOPANCREATOGRAPHY (ERCP);  Surgeon: Gatha Mayer, MD;  Location: [facility];  Service: Gastroenterology;  Laterality: N/A;   ESOPHAGOGASTRODUODENOSCOPY  02/07/2012   Procedure: ESOPHAGOGASTRODUODENOSCOPY (EGD);  Surgeon: Gatha Mayer, MD;  Location: [facility];  Service: Endoscopy;  Laterality: N/A;  endo with stent removal/ no dye needed/may need ercp scope/need xray   INCISION AND DRAINAGE ABSCESS N/A 11/12/2020   Procedure: INCISION AND DRAINAGE ABSCESS;  Surgeon: Diona Browner, DMD;  Location: [facility];  Service: Oral Surgery;  Laterality: N/A;   RLQ abscess drain placement  12/12/11   TOOTH EXTRACTION N/A 11/12/2020   Procedure: DENTAL RESTORATION/EXTRACTIONS;  Surgeon: Diona Browner, DMD;  Location: [facility];  Service: Oral Surgery;  Laterality: N/A;    Social History:  reports that she has never smoked. She has never used smokeless tobacco. She reports current alcohol use. She reports that she does not use drugs.  Allergies  Allergen Reactions   Ciprofloxacin Dermatitis    "dr's think it may have caused mastitis"   Morphine And Related Nausea Only    Nausea and headache   Pork-Derived Products Shortness Of Breath and Nausea And Vomiting   Morphine Other (See Comments)    Headaches, itching, anger    Family History  Problem Relation Age of Onset   Lung cancer Maternal Grandmother    Brain cancer Maternal Grandmother    Diabetes Mother    Heart failure Mother    Anesthesia problems Neg Hx    Colon cancer Neg Hx     Prior to Admission medications   Medication Sig Start Date End Date Taking? Authorizing Provider  Ascorbic Acid (VITAMIN C PO) Take 1 tablet by mouth daily. Patient not taking: Reported on 09/03/2021    [provider]  Echinacea 125 MG CAPS Take 125 mg by mouth daily. Patient not taking: Reported on 09/03/2021    [provider]  insulin aspart protamine - aspart (NOVOLOG 70/30 MIX) (70-30) 100 UNIT/ML FlexPen Inject 0.2 mLs (20 Units total) into the skin 2 (two) times daily  with a meal. Patient not taking: Reported on 09/03/2021 11/13/20   Mercy Riding, MD  Insulin Pen Needle (PEN NEEDLES) 30G X 5 MM MISC 1 pen by Does not apply route in the morning and at bedtime. 11/13/20   Mercy Riding, MD  lisinopril (ZESTRIL) 5 MG tablet Take 1 tablet (5 mg total) by mouth daily. Patient not taking: Reported on 09/03/2021 11/13/20 02/11/21  Mercy Riding, MD  Multiple Vitamin (MULTIVITAMIN WITH MINERALS) TABS tablet Take 1 tablet by mouth daily. Patient not taking: Reported on 09/03/2021    [provider]    Physical Exam: Vitals:   09/04/21 2200 09/04/21 2225 09/04/21 2356 09/05/21 0328  BP: 111/69 116/80 102/64 100/70  Pulse: 98 (!) 102 (!) 107 96  Resp: 18 18 15 15   Temp:  98.3 F (36.8 C) 98.5 F  (36.9 C) 98.1 F (36.7 C)  TempSrc:  Oral Oral Oral  SpO2: 95% 95% 97%   Weight:       Exam  Constitutional: Middle-aged woman who appears to be in no acute distress Eyes: PERRL, lids and conjunctivae normal ENMT: Mucous membranes are moist. Posterior pharynx clear of any exudate or lesions.  Neck: normal, supple, no masses, no thyromegaly Respiratory: clear to auscultation bilaterally, no wheezing, no crackles. Normal respiratory effort. No accessory muscle use.  Cardiovascular: Regular rate and rhythm, no murmurs / rubs / gallops. No extremity edema. 2+ pedal pulses. No carotid bruits.  Abdomen: Tenderness palpation in the left flank.  Bowel sounds present   Skin: Significant bruising appreciated over the left flank.    Data Reviewed:  Reviewed previous labs and glucose readings are blood sugars this morning were trending up to 322. Family Communication: None Primary team communication Thank you very much for involving [place] in the care of your patient.  Author: Norval Morton, MD 09/05/2021 7:52 AM  For on call review [email or web address].

## 2021-09-05 NOTE — Assessment & Plan Note (Addendum)
-  Patient presented initially with blood sugars elevated to 697 without elevated anion gap or signs of DKA, suggesting long standing DM2. She was previously on metformin but had been off medication for over a year due to diarrhea.  Hemoglobin A1c previously 12.4 in 10/2020.  She was hospitalized last year and supposed to be on 70/30 20 units twice daily, however she never took insulin.  She reports dietary nonadherence also, drinks at least 2 L of regular Dr. Malachi Bonds soda daily.  Consult extensively at bedside this morning regarding insulin compliance as well as checking her CBGs on a regular basis.  She will need to be set up with a PCP.  TOC consulted.  70/30 pens, needles, glucometer sent to the pharmacy.  Her CBGs in the 300 range, suspect this is chronic for her.  She will need longitudinal follow-up with PCP for improvement in her diabetes.  Okay to discharge

## 2021-09-05 NOTE — Progress Notes (Signed)
ANTICOAGULATION CONSULT NOTE - Initial Consult  Pharmacy Consult for Fondaparinux Indication: VTE prophylaxis  Allergies  Allergen Reactions   Ciprofloxacin Dermatitis    "dr's think it may have caused mastitis"   Morphine And Related Nausea Only    Nausea and headache   Pork-Derived Products Shortness Of Breath and Nausea And Vomiting   Morphine Other (See Comments)    Headaches, itching, anger    Patient Measurements: Weight: 90.7 kg (200 lb)  Vital Signs: Temp: 98.9 F (37.2 C) (02/05 1218) Temp Source: Oral (02/05 1218) BP: 114/78 (02/05 1218) Pulse Rate: 97 (02/05 1218)  Labs: Recent Labs    09/03/21 0408 09/03/21 0417 09/03/21 0545 09/03/21 1401 09/04/21 0739  HGB 13.1 13.9 12.0 10.9* 10.5*  HCT 38.8 41.0 35.3* 32.4* 32.2*  PLT 331  --   --  281 267  CREATININE 0.96 0.90  --   --  0.82    CrCl cannot be calculated (Unknown ideal weight.).   Medical History: Past Medical History:  Diagnosis Date   Allergy    Anxiety    Diabetes mellitus without complication ([place])    TMYTRZNB(567.0)    "every once in awhile"   Hepatic steatosis 02/24/11   Ligament tear    left; "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   PONV (postoperative nausea and vomiting)    Postoperative bile leak    Sepsis [facility])     Assessment: 51 yo female s/p fall with left flank hematoma. Hgb has stabilized. Due to patient allergy to pork - causing shortness of breath, Pharmacy consulted for Fondaparinux dosing.    Plan:  Start Fondaparinux 2.5 mg subq once daily Monitor CBC  Alanda Slim, PharmD, [facility] Clinical Pharmacist Please see AMION for all Pharmacists' Contact Phone Numbers 09/05/2021, 2:35 PM

## 2021-09-05 NOTE — Progress Notes (Addendum)
Subjective: AFVSS, NAEO.  Reports left flank pain that improves w/ PO meds. Fasting blood glucose this AM >300. Eager to get OOB. Denies nausea or vomiting.  Allergic to pork - states is causes her to have trouble breathing and she gets hospitalized.   Objective: Vital signs in last 24 hours: Temp:  [98.1 F (36.7 C)-98.5 F (36.9 C)] 98.1 F (36.7 C) (02/05 0328) Pulse Rate:  [86-111] 96 (02/05 0328) Resp:  [15-22] 15 (02/05 0328) BP: (95-116)/(59-89) 100/70 (02/05 0328) SpO2:  [93 %-98 %] 97 % (02/04 2356) Last BM Date: 09/04/21  Intake/Output from previous day: 02/04 0701 - 02/05 0700 In: 720 [P.O.:720] Out: -  Intake/Output this shift: No intake/output data recorded.  PE: General: resting comfortably, NAD, somnolent but arouses easily to voice Neuro: alert and oriented, no focal deficits Resp: normal work of breathing on room air CV: RRR Abdomen: soft, nondistended. L flank ecchymosis is stable - appropriately tender Extremities: warm and well-perfused   Lab Results:  Recent Labs    09/03/21 1401 09/04/21 0739  WBC 6.9 5.0  HGB 10.9* 10.5*  HCT 32.4* 32.2*  PLT 281 267   BMET Recent Labs    09/03/21 0408 09/03/21 0417 09/04/21 0739  NA 127* 131* 138  K 3.5 4.0 3.5  CL 93* 95* 102  CO2 24  --  30  GLUCOSE 697* 666* 162*  BUN 20 23* 8  CREATININE 0.96 0.90 0.82  CALCIUM 9.3  --  8.8*   PT/INR No results for input(s): LABPROT, INR in the last 72 hours. CMP     Component Value Date/Time   NA 138 09/04/2021 0739   K 3.5 09/04/2021 0739   CL 102 09/04/2021 0739   CO2 30 09/04/2021 0739   GLUCOSE 162 (H) 09/04/2021 0739   BUN 8 09/04/2021 0739   CREATININE 0.82 09/04/2021 0739   CREATININE 0.80 01/18/2012 1013   CALCIUM 8.8 (L) 09/04/2021 0739   PROT 6.9 09/03/2021 0408   ALBUMIN 3.6 09/03/2021 0408   AST 99 (H) 09/03/2021 0408   ALT 58 (H) 09/03/2021 0408   ALKPHOS 187 (H) 09/03/2021 0408   BILITOT 0.6 09/03/2021 0408   GFRNONAA  >60 09/04/2021 0739   GFRAA >60 08/01/2019 1954   Lipase     Component Value Date/Time   LIPASE 24 03/25/2017 1257       Studies/Results: CT CHEST ABDOMEN PELVIS W CONTRAST  Result Date: 09/03/2021 CLINICAL DATA:  Fall earlier today. Left flank pain. Poly trauma. Leukocytosis. EXAM: CT CHEST, ABDOMEN, AND PELVIS WITH CONTRAST TECHNIQUE: Multidetector CT imaging of the chest, abdomen and pelvis was performed following the standard protocol during bolus administration of intravenous contrast. RADIATION DOSE REDUCTION: This exam was performed according to the departmental dose-optimization program which includes automated exposure control, adjustment of the mA and/or kV according to patient size and/or use of iterative reconstruction technique. CONTRAST:  130mL OMNIPAQUE IOHEXOL 300 MG/ML  SOLN COMPARISON:  Chest abdomen pelvis CT earlier today. FINDINGS: CT CHEST FINDINGS Cardiovascular: The heart size is normal. No substantial pericardial effusion. No thoracic aortic aneurysm. Mediastinum/Nodes: No mediastinal lymphadenopathy. There is no hilar lymphadenopathy. The esophagus has normal imaging features. There is no axillary lymphadenopathy. Lungs/Pleura: No pneumothorax or pleural effusion. No suspicious nodule or mass. No focal airspace consolidation. Dependent atelectasis in the lower lobes bilaterally. Musculoskeletal: No evidence for rib fracture. Sternum intact. No thoracic spine fracture evident. CT ABDOMEN PELVIS FINDINGS Hepatobiliary: No suspicious focal abnormality within the liver  parenchyma. Gallbladder surgically absent. No intrahepatic or extrahepatic biliary dilation. Pancreas: No focal mass lesion. No dilatation of the main duct. No intraparenchymal cyst. No peripancreatic edema. Spleen: No splenomegaly. No focal mass lesion. Adrenals/Urinary Tract: No adrenal nodule or mass. Kidneys unremarkable. No evidence for hydroureter. Bladder is distended. Stomach/Bowel: Stomach is unremarkable.  No gastric wall thickening. No evidence of outlet obstruction. Duodenum is normally positioned as is the ligament of Treitz. No small bowel wall thickening. No small bowel dilatation. The terminal ileum is normal. The appendix is not well visualized, but there is no edema or inflammation in the region of the cecum. No gross colonic mass. No colonic wall thickening. Diverticular changes are noted in the left colon without evidence of diverticulitis. Vascular/Lymphatic: No abdominal aortic aneurysm. No abdominal lymphadenopathy. No pelvic sidewall lymphadenopathy. Reproductive: Prominent nabothian cyst noted in the cervix. There is no adnexal mass. Other: No intraperitoneal free fluid. Musculoskeletal: No worrisome lytic or sclerotic osseous abnormality. 10.8 x 6.7 x 7.2 cm hematoma identified in the subcutaneous tissues of the left flank region not substantially changed from 11.9 x 7.6 x 8.1 cm on the CT earlier today. Overlying edema is identified in the subcutaneous fat. No evidence for acute fracture involving the bony anatomy of the abdomen and pelvis. As noted on prior CT, no posterior lower left rib fracture evident. IMPRESSION: 1. No new findings in the chest, abdomen, or pelvis. 2. 10.8 x 6.7 x 7.2 cm "mass" in the left flank region compatible with hematoma in the subcutaneous tissues. This is not substantially changed from earlier today. No findings to suggest active or ongoing bleeding at this location on the current postcontrast study. Overlying edema is identified in the subcutaneous fat. 3. Left colonic diverticulosis without diverticulitis. Electronically Signed   By: Misty Stanley M.D.   On: 09/03/2021 09:22    Anti-infectives: Anti-infectives (From admission, onward)    None        Assessment/Plan 51 yo female s/p fall with left flank hematoma. - Hgb stabilized yesterday 12 > 10.9 > 10.5, re-check in AM - DM2: DM coordinator consulted. Novolin 70/30 BID ordered with SSI correction ACHS.  TRH consulted given ongoing hyperglycemia. Dr. Tamala Julian to see, appreciate his assistance. - D/C bed rest, PT/OT - Carb modified diet, SLIV - Multimodal pain control: prn tramadol, scheduled tylenol and robaxin - VTE: SCDs, ok for chemical VTE ppx today- pt cannot get LMWH as it contains pork, per pharmacy only option is Fondaparinux. Will discuss with my attending but will likely start this per pharmacy.  - Dispo: 4NP, PT/OT, monitor CBC EDD: 2/6 or 2/7     LOS: 2 days    Obie Dredge, [facility]  09/05/21 8:21 AM

## 2021-09-05 NOTE — Assessment & Plan Note (Addendum)
Per primary ?

## 2021-09-06 ENCOUNTER — Other Ambulatory Visit (HOSPITAL_COMMUNITY): Payer: Self-pay

## 2021-09-06 DIAGNOSIS — I10 Essential (primary) hypertension: Secondary | ICD-10-CM

## 2021-09-06 LAB — HEMOGLOBIN A1C
Hgb A1c MFr Bld: >15.5 % — ABNORMAL HIGH (ref 4.8–5.6)
Mean Plasma Glucose: >398 mg/dL

## 2021-09-06 LAB — CBC
HCT: 31 % — ABNORMAL LOW (ref 36.0–46.0)
Hemoglobin: 10.3 g/dL — ABNORMAL LOW (ref 12.0–15.0)
MCH: 30.6 pg (ref 26.0–34.0)
MCHC: 33.2 g/dL (ref 30.0–36.0)
MCV: 92 fL (ref 80.0–100.0)
Platelets: 285 10*3/uL (ref 150–400)
RBC: 3.37 MIL/uL — ABNORMAL LOW (ref 3.87–5.11)
RDW: 12.7 % (ref 11.5–15.5)
WBC: 8.2 10*3/uL (ref 4.0–10.5)
nRBC: 0.4 % — ABNORMAL HIGH (ref 0.0–0.2)

## 2021-09-06 LAB — GLUCOSE, CAPILLARY: Glucose-Capillary: 343 mg/dL — ABNORMAL HIGH (ref 70–99)

## 2021-09-06 MED ORDER — ACCU-CHEK GUIDE VI STRP
ORAL_STRIP | 12 refills | Status: AC
  Filled 2021-09-06: qty 100, 30d supply, fill #0

## 2021-09-06 MED ORDER — FLUCONAZOLE 200 MG PO TABS
200.0000 mg | ORAL_TABLET | Freq: Once | ORAL | Status: AC
  Administered 2021-09-06: 200 mg via ORAL
  Filled 2021-09-06: qty 1

## 2021-09-06 MED ORDER — NOVOLIN 70/30 FLEXPEN (70-30) 100 UNIT/ML ~~LOC~~ SUPN
20.0000 [IU] | PEN_INJECTOR | Freq: Two times a day (BID) | SUBCUTANEOUS | 0 refills | Status: DC
Start: 2021-09-06 — End: 2023-06-24
  Filled 2021-09-06: qty 12, 30d supply, fill #0

## 2021-09-06 MED ORDER — BLOOD GLUCOSE MONITOR KIT: PACK | 0 refills | Status: DC

## 2021-09-06 MED ORDER — INSULIN ASPART 100 UNIT/ML IJ SOLN: 16.0000 [IU] | Freq: Every day | INTRAMUSCULAR | Status: DC

## 2021-09-06 MED ORDER — INSULIN PEN NEEDLE 32G X 4 MM MISC
1.0000 | Freq: Two times a day (BID) | 0 refills | Status: DC
Start: 2021-09-06 — End: 2023-06-24
  Filled 2021-09-06: qty 100, 30d supply, fill #0

## 2021-09-06 MED ORDER — METHOCARBAMOL 500 MG PO TABS
1000.0000 mg | ORAL_TABLET | Freq: Three times a day (TID) | ORAL | 0 refills | Status: DC | PRN
  Filled 2021-09-06: qty 60, 10d supply, fill #0

## 2021-09-06 MED ORDER — INSULIN ASPART 100 UNIT/ML IJ SOLN
18.0000 [IU] | Freq: Every day | INTRAMUSCULAR | Status: DC
  Administered 2021-09-06: 18 [IU] via SUBCUTANEOUS

## 2021-09-06 MED ORDER — ACCU-CHEK GUIDE W/DEVICE KIT
PACK | 0 refills | Status: AC
  Filled 2021-09-06: qty 1, 30d supply, fill #0

## 2021-09-06 MED ORDER — OXYCODONE HCL 5 MG PO TABS
2.5000 mg | ORAL_TABLET | ORAL | 0 refills | Status: DC | PRN
  Filled 2021-09-06: qty 10, 4d supply, fill #0

## 2021-09-06 MED ORDER — ACCU-CHEK SOFTCLIX LANCETS MISC
5 refills | Status: DC
  Filled 2021-09-06: qty 100, 25d supply, fill #0

## 2021-09-06 MED ORDER — NITROFURANTOIN MONOHYD MACRO 100 MG PO CAPS
100.0000 mg | ORAL_CAPSULE | Freq: Two times a day (BID) | ORAL | 0 refills | Status: AC
Start: 2021-09-06 — End: 2021-09-11
  Filled 2021-09-06: qty 10, 5d supply, fill #0

## 2021-09-06 MED ORDER — ACETAMINOPHEN 500 MG PO TABS: 1000.0000 mg | ORAL_TABLET | Freq: Four times a day (QID) | ORAL | 0 refills | Status: DC | PRN

## 2021-09-06 NOTE — Progress Notes (Addendum)
As soon as I got report patient was calling out saying she couldn't breath and everything felt tight on her skin. I paged trauma doctor on call and he ordered ativan. I also put her on 2 liters of oxygen but her oxygen is around 94-95% room air. Then she slept until 10 pm. Her legs do look swollen but blood pressure is good. She also is not compliant with a diabetic diet. She asked for crackers, peanut butter, applesauce and pudding. Then she asked for juice but I said she could have a diet drink. Her blood sugar will probably be high this morning.

## 2021-09-06 NOTE — Hospital Course (Signed)
51 y.o. female with past medical history of diabetes mellitus type 2, hepatic steatosis, and obesity who presents after having a fall 2 days ago.  She was at home in her garage at the time talking to her husband when she slipped on water and fell backwards onto her left side.  She hit her head as well, but did not lose consciousness.  Work-up revealed left posterior flank subcutaneous hematoma 360 mL.  She was admitted to trauma service, hospitalist was consulted for diabetes management

## 2021-09-06 NOTE — Progress Notes (Signed)
PROGRESS NOTE  Karen [place] ERD:[id number] DOB: 02-19-1971 DOA: 09/03/2021 PCP: Patient, No Pcp Per (Inactive)   LOS: 3 days   Brief Narrative / Interim history: 51 y.o. female with past medical history of diabetes mellitus type 2, hepatic steatosis, and obesity who presents after having a fall 2 days ago.  She was at home in her garage at the time talking to her husband when she slipped on water and fell backwards onto her left side.  She hit her head as well, but did not lose consciousness.  Work-up revealed left posterior flank subcutaneous hematoma 360 mL.  She was admitted to trauma service, hospitalist was consulted for diabetes management  Subjective / 24h Interval events: -doing well this morning  Assessment and Plan: Uncontrolled type 2 diabetes mellitus with hyperglycemia, without long-term current use of insulin ([place])- (present on admission) -Patient presented initially with blood sugars elevated to 697 without elevated anion gap or signs of DKA, suggesting long standing DM2. She was previously on metformin but had been off medication for over a year due to diarrhea.  Hemoglobin A1c previously 12.4 in 10/2020.  She was hospitalized last year and supposed to be on 70/30 20 units twice daily, however she never took insulin.  She reports dietary nonadherence also, drinks at least 2 L of regular Dr. Malachi Bonds soda daily.  Consult extensively at bedside this morning regarding insulin compliance as well as checking her CBGs on a regular basis.  She will need to be set up with a PCP.  TOC consulted.  70/30 pens, needles, glucometer sent to the pharmacy.  Her CBGs in the 300 range, suspect this is chronic for her.  She will need longitudinal follow-up with PCP for improvement in her diabetes.  Okay to discharge  Essential hypertension - Has an old prescription for lisinopril but patient has not been taking it.  She is normotensive here.  Hold lisinopril on discharge, PCP follow-up  Obesity (BMI  30-39.9) -BMI 33.28 kg/m2.  She would benefit from weight loss   Transaminitis- (present on admission) -Labs significant for alkaline phosphatase to 187, AST 99, and ALT 58 on admission.  Review of records notes that these levels have been chronically elevated and has documented hepatic steatosis. Recommend outpatient follow-up for additional work-up  Hematoma of left flank secondary to fall with acute blood loss anemia- (present on admission) - Per primary   Scheduled Meds:  acetaminophen  1,000 mg Oral Q6H   docusate sodium  100 mg Oral BID   fondaparinux (ARIXTRA) injection  2.5 mg Subcutaneous Q24H   insulin aspart  0-5 Units Subcutaneous QHS   insulin aspart  0-9 Units Subcutaneous TID WC   insulin aspart  18 Units Subcutaneous Q breakfast   And   insulin aspart  16 Units Subcutaneous Q supper   methocarbamol  1,000 mg Oral QID   Continuous Infusions:  sodium chloride     PRN Meds:.LORazepam, ondansetron **OR** ondansetron (ZOFRAN) IV, oxyCODONE  Diet Orders (From admission, onward)     Start     Ordered   09/03/21 1743  Diet Carb Modified Fluid consistency: Thin; Room service appropriate? Yes  Diet effective now       Question Answer Comment  Diet-HS Snack? Nothing   Calorie Level Medium 1600-2000   Fluid consistency: Thin   Room service appropriate? Yes      09/03/21 1743            DVT prophylaxis: fondaparinux (ARIXTRA) injection 2.5 mg Start: 09/05/21 1515  SCDs Start: 09/03/21 0901   Lab Results  Component Value Date   PLT 285 09/06/2021      Code Status: Full Code  Family Communication: no family at bedside   Level of care: Progressive \  Objective: Vitals:   09/05/21 1940 09/05/21 2334 09/05/21 2338 09/06/21 0315  BP: 113/73 (!) 100/56  112/71  Pulse: (!) 102 (!) 107 100 92  Resp: 17 20  16   Temp: 98.7 F (37.1 C) 98.6 F (37 C)  98.3 F (36.8 C)  TempSrc: Oral Oral  Oral  SpO2: 95% 93%  94%  Weight:       No intake or output  data in the 24 hours ending 09/06/21 0814 Wt Readings from Last 3 Encounters:  09/03/21 90.7 kg  11/10/20 90.7 kg  08/28/20 98.7 kg    Examination:  Constitutional: NAD Eyes: no scleral icterus ENMT: Mucous membranes are moist.  Neck: normal, supple Respiratory: clear to auscultation bilaterally, no wheezing, no crackles. Normal respiratory effort.  Cardiovascular: Regular rate and rhythm, no murmurs / rubs / gallops. No LE edema.  Abdomen: non distended, no tenderness. Bowel sounds positive.  Musculoskeletal: no clubbing / cyanosis.    Data Reviewed: I have independently reviewed following labs and imaging studies   CBC Recent Labs  Lab 09/03/21 0408 09/03/21 0417 09/03/21 0545 09/03/21 1401 09/04/21 0739 09/06/21 0419  WBC 16.8*  --   --  6.9 5.0 8.2  HGB 13.1 13.9 12.0 10.9* 10.5* 10.3*  HCT 38.8 41.0 35.3* 32.4* 32.2* 31.0*  PLT 331  --   --  281 267 285  MCV 89.2  --   --  89.5 90.7 92.0  MCH 30.1  --   --  30.1 29.6 30.6  MCHC 33.8  --   --  33.6 32.6 33.2  RDW 12.1  --   --  12.3 12.4 12.7  LYMPHSABS 2.1  --   --   --   --   --   MONOABS 1.2*  --   --   --   --   --   EOSABS 0.1  --   --   --   --   --   BASOSABS 0.1  --   --   --   --   --     Recent Labs  Lab 09/03/21 0408 09/03/21 0417 09/04/21 0739  NA 127* 131* 138  K 3.5 4.0 3.5  CL 93* 95* 102  CO2 24  --  30  GLUCOSE 697* 666* 162*  BUN 20 23* 8  CREATININE 0.96 0.90 0.82  CALCIUM 9.3  --  8.8*  AST 99*  --   --   ALT 58*  --   --   ALKPHOS 187*  --   --   BILITOT 0.6  --   --   ALBUMIN 3.6  --   --     ------------------------------------------------------------------------------------------------------------------ No results for input(s): CHOL, HDL, LDLCALC, TRIG, CHOLHDL, LDLDIRECT in the last 72 hours.  Lab Results  Component Value Date   HGBA1C 12.4 (H) 11/11/2020    ------------------------------------------------------------------------------------------------------------------ No results for input(s): TSH, T4TOTAL, T3FREE, THYROIDAB in the last 72 hours.  Invalid input(s): FREET3  Cardiac Enzymes No results for input(s): CKMB, TROPONINI, MYOGLOBIN in the last 168 hours.  Invalid input(s): CK ------------------------------------------------------------------------------------------------------------------ No results found for: BNP  CBG: Recent Labs  Lab 09/05/21 0758 09/05/21 1158 09/05/21 1704 09/05/21 2127 09/06/21 0700  GLUCAP 322* 270* 303* 326* 343*  Recent Results (from the past 240 hour(s))  Resp Panel by RT-PCR (Flu A&B, Covid) Nasopharyngeal Swab     Status: None   Collection Time: 09/03/21  9:47 AM   Specimen: Nasopharyngeal Swab; Nasopharyngeal(NP) swabs in vial transport medium  Result Value Ref Range Status   SARS Coronavirus 2 by RT PCR NEGATIVE NEGATIVE Final    Comment: (NOTE) SARS-CoV-2 target nucleic acids are NOT DETECTED.  The SARS-CoV-2 RNA is generally detectable in upper respiratory specimens during the acute phase of infection. The lowest concentration of SARS-CoV-2 viral copies this assay can detect is 138 copies/mL. A negative result does not preclude SARS-Cov-2 infection and should not be used as the sole basis for treatment or other patient management decisions. A negative result may occur with  improper specimen collection/handling, submission of specimen other than nasopharyngeal swab, presence of viral mutation(s) within the areas targeted by this assay, and inadequate number of viral copies(<138 copies/mL). A negative result must be combined with clinical observations, patient history, and epidemiological information. The expected result is Negative.  Fact Sheet for Patients:  EntrepreneurPulse.com.au  Fact Sheet for Healthcare Providers:   IncredibleEmployment.be  This test is no t yet approved or cleared by the [place] FDA and  has been authorized for detection and/or diagnosis of SARS-CoV-2 by FDA under an Emergency Use Authorization (EUA). This EUA will remain  in effect (meaning this test can be used) for the duration of the COVID-19 declaration under Section 564(b)(1) of the Act, 21 U.S.C.section 360bbb-3(b)(1), unless the authorization is terminated  or revoked sooner.       Influenza A by PCR NEGATIVE NEGATIVE Final   Influenza B by PCR NEGATIVE NEGATIVE Final    Comment: (NOTE) The Xpert Xpress SARS-CoV-2/FLU/RSV plus assay is intended as an aid in the diagnosis of influenza from Nasopharyngeal swab specimens and should not be used as a sole basis for treatment. Nasal washings and aspirates are unacceptable for Xpert Xpress SARS-CoV-2/FLU/RSV testing.  Fact Sheet for Patients: EntrepreneurPulse.com.au  Fact Sheet for Healthcare Providers: IncredibleEmployment.be  This test is not yet approved or cleared by the [place] FDA and has been authorized for detection and/or diagnosis of SARS-CoV-2 by FDA under an Emergency Use Authorization (EUA). This EUA will remain in effect (meaning this test can be used) for the duration of the COVID-19 declaration under Section 564(b)(1) of the Act, 21 U.S.C. section 360bbb-3(b)(1), unless the authorization is terminated or revoked.  Performed at [facility], [place] [address] [place], [place] [address]   Urine Culture     Status: Abnormal   Collection Time: 09/03/21 10:17 AM   Specimen: Urine, Clean Catch  Result Value Ref Range Status   Specimen Description   Final    URINE, CLEAN CATCH Performed at [facility], [place] [address] [place], [place] [address]    Special Requests   Final    NONE Performed at [facility], [place] [address],  [place], [place] [address]    Culture >=100,000 COLONIES/mL ESCHERICHIA COLI (A)  Final   Report Status 09/05/2021 FINAL  Final   Organism ID, Bacteria ESCHERICHIA COLI (A)  Final      Susceptibility   Escherichia coli - MIC*    AMPICILLIN >=32 RESISTANT Resistant     CEFAZOLIN <=4 SENSITIVE Sensitive     CEFEPIME <=0.12 SENSITIVE Sensitive     CEFTRIAXONE <=0.25 SENSITIVE Sensitive     CIPROFLOXACIN <=0.25 SENSITIVE Sensitive     GENTAMICIN >=16 RESISTANT  Resistant     IMIPENEM <=0.25 SENSITIVE Sensitive     NITROFURANTOIN <=16 SENSITIVE Sensitive     TRIMETH/SULFA >=320 RESISTANT Resistant     AMPICILLIN/SULBACTAM >=32 RESISTANT Resistant     PIP/TAZO <=4 SENSITIVE Sensitive     * >=100,000 COLONIES/mL ESCHERICHIA COLI  MRSA Next Gen by PCR, Nasal     Status: None   Collection Time: 09/03/21  1:43 PM   Specimen: Nasal Mucosa; Nasal Swab  Result Value Ref Range Status   MRSA by PCR Next Gen NOT DETECTED NOT DETECTED Final    Comment: (NOTE) The GeneXpert MRSA Assay (FDA approved for NASAL specimens only), is one component of a comprehensive MRSA colonization surveillance program. It is not intended to diagnose MRSA infection nor to guide or monitor treatment for MRSA infections. Test performance is not FDA approved in patients less than 80 years old. Performed at [facility], [place] [address] [place], [place] [address]      Radiology Studies: No results found.   Marzetta Board, MD, PhD [facility]  Between 7 am - 7 pm I am available, please contact me via Amion (for emergencies) or Securechat (non urgent messages)  Between 7 pm - 7 am I am not available, please contact night coverage MD/APP via Amion

## 2021-09-06 NOTE — Assessment & Plan Note (Signed)
-   Has an old prescription for lisinopril but patient has not been taking it.  She is normotensive here.  Hold lisinopril on discharge, PCP follow-up

## 2021-09-06 NOTE — Discharge Summary (Addendum)
Patient ID: Karen Roberts [id number] 02-09-1971 51 y.o.  Admit date: 09/03/2021 Discharge date: 09/06/2021  Admitting Diagnosis: Fall L Flank hematoma L hand/wrist pain  Hyperglycemia in the setting of untreated DM2   Discharge Diagnosis Patient Active Problem List   Diagnosis Date Noted   Essential hypertension 09/06/2021   Uncontrolled type 2 diabetes mellitus with hyperglycemia, without long-term current use of insulin ([place]) 09/05/2021   Hematoma of left flank secondary to fall with acute blood loss anemia 09/05/2021   Fall at home, initial encounter 09/05/2021   Transaminitis 09/05/2021   Obesity (BMI 30-39.9) 09/05/2021   Cellulitis 11/11/2020   Bile leak, postoperative 12/13/2011   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 female with a hx DM2 who presented to [facility] ED after a fall.  Patient reports that she was in her garage talking to her husband when she slipped on water and fell backwards onto her left side.  She did hit her head.  No loss of consciousness.  Patient complains of headache, left hand pain and left flank pain.  No other complaints.  She is very tearful.  Work-up noted left posterior chest/flank subcu hematoma with estimated volume of 360 mL.  Patient noted to become hypotensive around 815 with BP 86/66.  EDP reports this was after giving ketamine which looks like it was given at prior to 7 AM. 1U PRBC ordered.  Patient denies any blood thinner use. patient also noted to have glucose 697 on arrival.  She was given 10 units of insulin with improvement to 448.  She reports that she previously was on metformin for her diabetes but this gave her diarrhea and made her lose weight so she stopped this.  It has been greater than 1 year since she has been on any medication for her diabetes or checked  her blood sugars.   Patient notes history of prior cholecystectomy and appendectomy.  Occasional alcohol use.  No tobacco use.  No illicit drug use.  She is not currently employed.  Lives at home with her husband.  Procedures none  Hospital Course:  The patient was admitted and monitored with repeat cbc for her hematoma in the ICU.  Her hgb dropped mildly and then stabilized.  She had an abdominal binder placed.  She was also noted to have uncontrolled DM.  She was treated with SSI as well as novolog.  IM was consulted who adjusted her regimen for home and the patient was able to be MATCHed to assist her with her medications at home.  She was also arranged to follow up with [facility] for her DM and her hematoma, prn.  She was stable on HD 3 for DC home.  Urine culture was noted to be positive for resistant e coli.  Will treat with Macrobid at home for 5 days.  Physical Exam: Gen: NAD Heart: regular Lungs: CTAB Abd: soft, tender over left flank hematoma which is stable, +BS, ND Ext: MAE Psych: A&Ox3  Allergies as of 09/06/2021       Reactions   Ciprofloxacin Dermatitis   "dr's think it may have caused mastitis"   Morphine And Related Nausea Only   Nausea and headache   Pork-derived Products Shortness Of Breath, Nausea And Vomiting   Morphine Other (See Comments)   Headaches, itching, anger  Medication List     STOP taking these medications    Echinacea 125 MG Caps   insulin aspart protamine - aspart (70-30) 100 UNIT/ML FlexPen Commonly known as: NOVOLOG 70/30 MIX   lisinopril 5 MG tablet Commonly known as: ZESTRIL   VITAMIN C PO       TAKE these medications    Accu-Chek Guide test strip Generic drug: glucose blood Use as instructed up to 4 times daily   Accu-Chek Guide w/Device Kit Use as directed   Accu-Chek Softclix Lancets lancets Use as directed up to 4 times daily   acetaminophen 500 MG tablet Commonly known as: TYLENOL Take  2 tablets (1,000 mg total) by mouth every 6 (six) hours as needed.   blood glucose meter kit and supplies Kit Dispense based on patient and insurance preference. Can dispense strips and lancets only if patient has a glucometer at home. Check CBGs 4 times daily before each meal and before bedtime   methocarbamol 500 MG tablet Commonly known as: ROBAXIN Take 2 tablets (1,000 mg total) by mouth every 8 (eight) hours as needed for muscle spasms.   multivitamin with minerals Tabs tablet Take 1 tablet by mouth daily.   nitrofurantoin (macrocrystal-monohydrate) 100 MG capsule Commonly known as: Macrobid Take 1 capsule (100 mg total) by mouth 2 (two) times daily for 5 days.   NovoLIN 70/30 Kwikpen (70-30) 100 UNIT/ML KwikPen Generic drug: insulin isophane & regular human KwikPen Inject 20 Units into the skin 2 (two) times daily.   oxyCODONE 5 MG immediate release tablet Commonly known as: Oxy IR/ROXICODONE Take 1/2 tablets (2.5 mg total) by mouth every 4 (four) hours as needed for severe pain.   Pentips 32G X 4 MM Misc Generic drug: Insulin Pen Needle Use to inject insulin twice daily. What changed:  medication strength how much to take when to take this          Follow-up Information     Karen Donovan, PA-C Follow up.   Specialty: Family Medicine Why: TIME : 1:30 PM DATE: MARCH 01,2023 Karen [place] , PA-C LOCATION :[place] . [id number]  For your diabetes and hematoma (if needed) Contact information: [place] [id number] [phone]                <30 minutes of time spent arranging discharge.  Signed: Saverio Danker, [facility] 09/06/2021, 9:57 AM Please see Amion for pager number during day hours 7:00am-4:30pm, 7-11:30am on Weekends

## 2021-09-06 NOTE — Progress Notes (Signed)
Inpatient Diabetes Program Recommendations  AACE/ADA: New Consensus Statement on Inpatient Glycemic Control (2015)  Target Ranges:  Prepandial:   less than 140 mg/dL      Peak postprandial:   less than 180 mg/dL (1-2 hours)      Critically ill patients:  140 - 180 mg/dL   Lab Results  Component Value Date   GLUCAP 343 (H) 09/06/2021   HGBA1C 12.4 (H) 11/11/2020    Review of Glycemic Control  Latest Reference Range & Units 09/05/21 07:58 09/05/21 11:58 09/05/21 17:04 09/05/21 21:27 09/06/21 07:00  Glucose-Capillary 70 - 99 mg/dL 322 (H) 270 (H) 303 (H) 326 (H) 343 (H)  (H): Data is abnormally high  Diabetes history: DM2  Current orders for Inpatient glycemic control: Novolog 18 units with breakfast and 16 units with supper, Novolog 0-9 units TID and 0-5 units QHS  Inpatient Diabetes Program Recommendations:    Semglee 20 units QD Novolog 0-15 units TID  Will continue to follow while inpatient.  Thank you, Reche Dixon, MSN, RN Diabetes Coordinator Inpatient Diabetes Program [phone] (team pager from 8a-5p)

## 2021-09-06 NOTE — Progress Notes (Signed)
Patient is asking this morning if she can go home.

## 2021-09-06 NOTE — Plan of Care (Signed)
Nutrition Education Note  RD consulted for nutrition education regarding diabetes.   Lab Results  Component Value Date   HGBA1C 12.4 (H) 11/11/2020   RD provided "Carbohydrate Counting for People with Diabetes" handout from the Academy of Nutrition and Dietetics. Discussed different food groups and their effects on blood sugar, emphasizing carbohydrate-containing foods. Provided list of carbohydrates and recommended serving sizes of common foods.  Discussed importance of controlled and consistent carbohydrate intake throughout the day. Provided examples of ways to balance meals/snacks and encouraged intake of high-fiber, whole grain complex carbohydrates. Teach back method used.  Expect fair compliance.  RD to provide referral for outpatient diabetes management.  Body mass index is 33.28 kg/m.  Current diet order is Carb Modified, patient is consuming approximately 100% of meals at this time. Labs and medications reviewed.   No further nutrition interventions warranted at this time. RD contact information provided. If additional nutrition issues arise, please re-consult RD.  Derrel Nip, RD, LDN (she/her/hers) Clinical Inpatient Dietitian RD Pager/After-Hours/Weekend Pager # in [place]

## 2021-09-06 NOTE — Progress Notes (Signed)
Pt with orders to d/c home. Assessment complete pt stable. IV removed. Clothes received from [facility] pt voiced frustrations as to why she could not wait in her room for her ride to bring her clothes. Frustrations acknowledged and all questions answered. Discharge education and packet provided all questions answered. Prescriptions delivered to room. Pt and all belongings transported via wheelchair to discharge lounge without incident.

## 2021-09-06 NOTE — TOC Transition Note (Signed)
Transition of Care [facility]) - CM/SW Discharge Note   Patient Details  Name: Karen [place] MRN: [id number] Date of Birth: 07-31-1971  Transition of [facility]) CM/SW Contact:  Ella Bodo, RN Phone Number: 09/06/2021, 10:33 AM   Clinical Narrative:    51 y/o female presented to ED on 09/03/21 after fall with severe L flank pain. Found to have L flank hematoma and L hand/wrist pain (awaiting x-ray). PMH: diabetes. PTA, pt independent and living at home with spouse.  PT/OT recommending no OP follow up.  Patient discharging home with spouse to provide assistance with care.  Pt is uninsured, but is eligible for medication assistance through [facility] program; dc Rx sent to [facility] to be filled using [place] letter.  Stressed importance of keeping PCP appointment with [facility] on March 1; patient verbalizes understanding, and states she will definitely attend appointment.     Final next level of care: Home/Self Care Barriers to Discharge: Barriers Resolved   Patient Goals and CMS Choice Patient states their goals for this hospitalization and ongoing recovery are:: to go home                            Discharge Plan and Services   Discharge Planning Services: CM Consult, Medication Assistance, [place] [facility], Follow-up appt scheduled                                 Social Determinants of Health (SDOH) Interventions     Readmission Risk Interventions Readmission Risk Prevention Plan 11/13/2020  Post Dischage Appt Complete  Medication Screening Complete  Transportation Screening Complete  Some recent data might be hidden   Reinaldo Raddle, RN, BSN  Trauma/Neuro ICU Case Manager [phone]

## 2021-09-06 NOTE — Evaluation (Signed)
Occupational Therapy Evaluation Patient Details Name: Karen Roberts MRN: [id number] DOB: July 03, 1971 Today's Date: 09/06/2021   History of Present Illness 51 y/o female presented to ED on 09/03/21 after fall with severe L flank pain. Found to have L flank hematoma and L hand/wrist pain (awaiting x-ray). PMH: diabetes   Clinical Impression   PTA patient was living with her spouse in a private residence and was grossly I with ADLs/IADLs without AD. Patient does not work or drive but reports being responsible for meal prep and housekeeping at baseline. Patient currently functioning near baseline demonstrating observed ADLs with Mod I overall. Assist required to doff/don footwear secondary to L flank pain. Patient not interested in compensatory techniques to improve independence with LB dressing reporting family can assist at time of d/c. Patient does not require continued acute occupational therapy services with OT to sign off at this time. Recommend mobility with nursing staff during hospital admission.      Recommendations for follow up therapy are one component of a multi-disciplinary discharge planning process, led by the attending physician.  Recommendations may be updated based on patient status, additional functional criteria and insurance authorization.   Follow Up Recommendations  No OT follow up    Assistance Recommended at Discharge PRN  Patient can return home with the following A little help with bathing/dressing/bathroom    Functional Status Assessment  Patient has not had a recent decline in their functional status  Equipment Recommendations  None recommended by OT    Recommendations for Other Services       Precautions / Restrictions Precautions Precautions: Fall Restrictions Weight Bearing Restrictions: No      Mobility Bed Mobility Overal bed mobility: Modified Independent                  Transfers Overall transfer level: Modified independent Equipment  used: None                      Balance Overall balance assessment: No apparent balance deficits (not formally assessed)                                         ADL either performed or assessed with clinical judgement   ADL                                               Vision   Vision Assessment?: No apparent visual deficits     Perception     Praxis      Pertinent Vitals/Pain Pain Assessment Pain Assessment: Faces Faces Pain Scale: Hurts even more Pain Location: L flank Pain Descriptors / Indicators: Grimacing, Guarding Pain Intervention(s): Limited activity within patient's tolerance, Monitored during session, Repositioned     Hand Dominance     Extremity/Trunk Assessment Upper Extremity Assessment Upper Extremity Assessment: Overall WFL for tasks assessed   Lower Extremity Assessment Lower Extremity Assessment: Overall WFL for tasks assessed   Cervical / Trunk Assessment Cervical / Trunk Assessment: Normal   Communication Communication Communication: No difficulties   Cognition Arousal/Alertness: Awake/alert Behavior During Therapy: WFL for tasks assessed/performed Overall Cognitive Status: Within Functional Limits for tasks assessed  General Comments  Patient declined education on compensatory techniques for ADLs. LB ADLs limited by pain.    Exercises     Shoulder Instructions      Home Living Family/patient expects to be discharged to:: Private residence Living Arrangements: Spouse/significant other Available Help at Discharge: Family;Available PRN/intermittently Type of Home: House Home Access: Stairs to enter [employer] of Steps: 4-5 Entrance Stairs-Rails: None Home Layout: One level     Bathroom Shower/Tub: Tub/shower unit;Walk-in shower   Bathroom Toilet: Standard     Home Equipment: None          Prior  Functioning/Environment Prior Level of Function : Independent/Modified Independent;History of Falls (last six months)             Mobility Comments: states she does not work or drive ADLs Comments: I with cooking/cleaning        OT Problem List: Pain      OT Treatment/Interventions:      OT Goals(Current goals can be found in the care plan section) Acute Rehab OT Goals Patient Stated Goal: "Let's just get this over with". OT Goal Formulation: With patient  OT Frequency:      Co-evaluation              AM-PAC OT "6 Clicks" Daily Activity     Outcome Measure Help from another person eating meals?: None Help from another person taking care of personal grooming?: None Help from another person toileting, which includes using toliet, bedpan, or urinal?: None Help from another person bathing (including washing, rinsing, drying)?: A Little Help from another person to put on and taking off regular upper body clothing?: None Help from another person to put on and taking off regular lower body clothing?: A Little 6 Click Score: 22   End of Session Nurse Communication: Mobility status  Activity Tolerance: Patient tolerated treatment well Patient left: in bed;with call bell/phone within reach;with bed alarm set  OT Visit Diagnosis: Pain Pain - Right/Left: Left Pain - part of body:  (flank)                Time: 5732-2025 OT Time Calculation (min): 8 min Charges:  OT General Charges $OT Visit: 1 Visit OT Evaluation $OT Eval Low Complexity: 1 Low  Solash Tullo H. OTR/L Supplemental OT, Department of rehab services [phone]  Elouise Divelbiss R H. 09/06/2021, 7:38 AM

## 2021-09-06 NOTE — Social Work (Signed)
CSW received message patient needs clothing to prevent delay in discharge.  CSW met with the patient and offered clothing- patient became upset,begins to pull out wires and  states " I'm not wearing somebody else clothing"  and states her ride will be here at 1:00 and she has told several people what time her ride will be here. CSW acknowledges her frustration and apologized to deescalate to situation. CSW gently explained the need for bed, patient did calm down and was agreeable to receiving a shirt and moving forward with the discharge.    Thurmond Butts, MSW, LCSW Clinical Social Worker

## 2021-09-29 ENCOUNTER — Inpatient Hospital Stay: Payer: Self-pay | Admitting: Physician Assistant

## 2021-09-29 NOTE — Progress Notes (Deleted)
Patient ID: Karen Roberts, female   DOB: 09-Nov-1970, 51 y.o.   MRN: [id number] ? ? ?Admit date: 09/03/2021 ?Discharge date: 09/06/2021 ?  ?Admitting Diagnosis: ?Fall ?L Flank hematoma ?L hand/wrist pain  ?Hyperglycemia in the setting of untreated DM2  ? ?Reason for Admission: ?[name] is a 51 y.o. female with a hx DM2 who presented to [facility] ED after a fall.  Patient reports that she was in her garage talking to her husband when she slipped on water and fell backwards onto her left side.  She did hit her head.  No loss of consciousness.  Patient complains of headache, left hand pain and left flank pain.  No other complaints.  She is very tearful.  Work-up noted left posterior chest/flank subcu hematoma with estimated volume of 360 mL.  Patient noted to become hypotensive around 815 with BP 86/66.  EDP reports this was after giving ketamine which looks like it was given at prior to 7 AM. 1U PRBC ordered.  Patient denies any blood thinner use. patient also noted to have glucose 697 on arrival.  She was given 10 units of insulin with improvement to 448.  She reports that she previously was on metformin for her diabetes but this gave her diarrhea and made her lose weight so she stopped this.  It has been greater than 1 year since she has been on any medication for her diabetes or checked her blood sugars. ?  ?Patient notes history of prior cholecystectomy and appendectomy.  Occasional alcohol use.  No tobacco use.  No illicit drug use.  She is not currently employed.  Lives at home with her husband. ?  ?Procedures ?none ?  ?Hospital Course:  ?The patient was admitted and monitored with repeat cbc for her hematoma in the ICU.  Her hgb dropped mildly and then stabilized.  She had an abdominal binder placed.  She was also noted to have uncontrolled DM.  She was treated with SSI as well as novolog.  IM was consulted who adjusted her regimen for home and the patient was able to be MATCHed to assist her with her medications at  home.  She was also arranged to follow up with [facility] for her DM and her hematoma, prn.  She was stable on HD 3 for DC home.  Urine culture was noted to be positive for resistant e coli.  Will treat with Macrobid at home for 5 days. ?  ?

## 2021-10-04 ENCOUNTER — Telehealth: Payer: Self-pay

## 2021-10-04 NOTE — Telephone Encounter (Signed)
Pt advised that she was a old pt and has Friday insurance and would like to return. Pt fell and needs a follow up as well. Please advise if you are willing to see her again.  ? ?Blima Singer RMA ?

## 2021-10-04 NOTE — Telephone Encounter (Signed)
Pt has no showed her new old pt follow with [facility] and was not to be rescheduled . Please disregarded. ?

## 2021-10-04 NOTE — Telephone Encounter (Signed)
Pt was advised due to previous no show that we will not be able to schedule her.[place] ?

## 2021-10-29 ENCOUNTER — Ambulatory Visit: Payer: Self-pay | Admitting: Dietician

## 2021-10-29 ENCOUNTER — Encounter: Payer: Self-pay | Admitting: Dietician

## 2021-10-29 NOTE — Progress Notes (Signed)
Spoke with patient via MyChart for appointment. ?Patient is currently in [place] visiting her mother who also has diabetes to try to get her diabetes under control.  She states that she is logging her blood glucose and numbers have improved. ? ?Patient is at a grocery store with her mother.  Appointment has been rescheduled for June. ? ?Antonieta Iba, RD, LDN, CDCES ? ?

## 2022-01-03 ENCOUNTER — Ambulatory Visit: Payer: Self-pay | Admitting: Dietician

## 2022-03-28 ENCOUNTER — Ambulatory Visit: Payer: Self-pay | Admitting: Dietician

## 2022-09-22 ENCOUNTER — Other Ambulatory Visit: Payer: Self-pay

## 2022-09-22 ENCOUNTER — Emergency Department (HOSPITAL_COMMUNITY)
Admission: EM | Admit: 2022-09-22 | Discharge: 2022-09-23 | Disposition: A | Discharge status: Home / Self Care | Payer: Self-pay | Attending: Emergency Medicine | Admitting: Emergency Medicine

## 2022-09-22 ENCOUNTER — Emergency Department (HOSPITAL_COMMUNITY): Payer: Self-pay

## 2022-09-22 DIAGNOSIS — E119 Type 2 diabetes mellitus without complications: Secondary | ICD-10-CM

## 2022-09-22 DIAGNOSIS — R739 Hyperglycemia, unspecified: Secondary | ICD-10-CM

## 2022-09-22 DIAGNOSIS — M25521 Pain in right elbow: Secondary | ICD-10-CM | POA: Insufficient documentation

## 2022-09-22 DIAGNOSIS — Z794 Long term (current) use of insulin: Secondary | ICD-10-CM | POA: Insufficient documentation

## 2022-09-22 DIAGNOSIS — E1165 Type 2 diabetes mellitus with hyperglycemia: Secondary | ICD-10-CM | POA: Insufficient documentation

## 2022-09-22 DIAGNOSIS — M25511 Pain in right shoulder: Secondary | ICD-10-CM | POA: Insufficient documentation

## 2022-09-22 LAB — CBC
HCT: 40.6 % (ref 36.0–46.0)
Hemoglobin: 13.6 g/dL (ref 12.0–15.0)
MCH: 29.4 pg (ref 26.0–34.0)
MCHC: 33.5 g/dL (ref 30.0–36.0)
MCV: 87.7 fL (ref 80.0–100.0)
Platelets: 281 10*3/uL (ref 150–400)
RBC: 4.63 MIL/uL (ref 3.87–5.11)
RDW: 12.3 % (ref 11.5–15.5)
WBC: 7.1 10*3/uL (ref 4.0–10.5)
nRBC: 0 % (ref 0.0–0.2)

## 2022-09-22 LAB — BLOOD GAS, VENOUS
Acid-Base Excess: 1.7 mmol/L (ref 0.0–2.0)
Bicarbonate: 28.6 mmol/L — ABNORMAL HIGH (ref 20.0–28.0)
O2 Saturation: 76.7 %
Patient temperature: 37
pCO2, Ven: 53 mmHg (ref 44–60)
pH, Ven: 7.34 (ref 7.25–7.43)
pO2, Ven: 45 mmHg (ref 32–45)

## 2022-09-22 LAB — URINALYSIS, ROUTINE W REFLEX MICROSCOPIC
Bacteria, UA: NONE SEEN
Bilirubin Urine: NEGATIVE
Glucose, UA: >=500 mg/dL — AB
Hgb urine dipstick: NEGATIVE
Ketones, ur: NEGATIVE mg/dL
Leukocytes,Ua: NEGATIVE
Nitrite: NEGATIVE
Protein, ur: NEGATIVE mg/dL
Specific Gravity, Urine: 1.027 (ref 1.005–1.030)
pH: 6 (ref 5.0–8.0)

## 2022-09-22 LAB — BASIC METABOLIC PANEL
Anion gap: 9 (ref 5–15)
BUN: 17 mg/dL (ref 6–20)
CO2: 27 mmol/L (ref 22–32)
Calcium: 9.1 mg/dL (ref 8.9–10.3)
Chloride: 93 mmol/L — ABNORMAL LOW (ref 98–111)
Creatinine, Ser: 0.88 mg/dL (ref 0.44–1.00)
GFR, Estimated: >60 mL/min (ref >60)
Glucose, Bld: 628 mg/dL (ref 70–99)
Potassium: 4.2 mmol/L (ref 3.5–5.1)
Sodium: 129 mmol/L — ABNORMAL LOW (ref 135–145)

## 2022-09-22 LAB — BETA-HYDROXYBUTYRIC ACID: Beta-Hydroxybutyric Acid: 0.1 mmol/L (ref 0.05–0.27)

## 2022-09-22 LAB — CBG MONITORING, ED
Glucose-Capillary: 480 mg/dL — ABNORMAL HIGH (ref 70–99)
Glucose-Capillary: >600 mg/dL (ref 70–99)

## 2022-09-22 MED ORDER — INSULIN ASPART 100 UNIT/ML IJ SOLN
10.0000 [IU] | Freq: Once | INTRAMUSCULAR | Status: AC
  Administered 2022-09-22: 10 [IU] via SUBCUTANEOUS
  Filled 2022-09-22: qty 0.1

## 2022-09-22 MED ORDER — INSULIN ASPART PROT & ASPART (70-30 MIX) 100 UNIT/ML ~~LOC~~ SUSP
15.0000 [IU] | Freq: Once | SUBCUTANEOUS | Status: AC
  Administered 2022-09-23: 15 [IU] via SUBCUTANEOUS
  Filled 2022-09-22: qty 10

## 2022-09-22 MED ORDER — SODIUM CHLORIDE 0.9 % IV BOLUS
1000.0000 mL | Freq: Once | INTRAVENOUS | Status: AC
  Administered 2022-09-22: 1000 mL via INTRAVENOUS

## 2022-09-22 NOTE — ED Notes (Signed)
No answer for triage x2

## 2022-09-22 NOTE — ED Triage Notes (Signed)
Pt states she was not feeling like herself. Felt fogy. Endorses T2DM. Prescribed insuline. Unable to obtain d/t lack of insurance. Unknown last dose, several months. CBG in triage > 600.

## 2022-09-22 NOTE — ED Provider Triage Note (Signed)
Emergency Medicine Provider Triage Evaluation Note  Karen Roberts , a 52 y.o. female  was evaluated in triage.  Pt complains of feeling unwell. Hx of DM not able to afford medications for several months.  Report polyuria, polydipsia, fatigue, nausea, and overal not feeling well for more than a week.  No cold sxs. Review of Systems  Positive: As above Negative: As above  Physical Exam  BP (!) 150/93 (BP Location: Left Arm)   Pulse 90   Temp 98.1 F (36.7 C) (Oral)   Resp 20   LMP 11/29/2016 (Approximate)   SpO2 100%  Gen:   Awake, no distress   Resp:  Normal effort  MSK:   Moves extremities without difficulty  Other:    Medical Decision Making  Medically screening exam initiated at 9:01 PM.  Appropriate orders placed.  Lavayah Grody was informed that the remainder of the evaluation will be completed by another provider, this initial triage assessment does not replace that evaluation, and the importance of remaining in the ED until their evaluation is complete.     Domenic Moras, PA-C 09/22/22 2103

## 2022-09-22 NOTE — ED Notes (Signed)
Glucose rechecked, down to 480 at this time.

## 2022-09-22 NOTE — ED Notes (Addendum)
Labeled specimen cup given to pt for U/A collection per MD order. ENMiles 

## 2022-09-22 NOTE — ED Provider Notes (Signed)
[facility] Provider Note   CSN: [id number] Arrival date & time: 09/22/22  2026     History  Chief Complaint  Patient presents with   Hyperglycemia    Karen Roberts is a 52 y.o. female.  HPI Reports due to loss of insurance she has not had insulin for 7 months.  She reports that her blood sugars were running in the 300s several months ago.  She stopped checking them because she did not have any medication to treat her blood sugar with anyways.  She reports that lately she is had really dry mouth been thirsty and urinating very frequently.  She reports she was at work this evening and she just could not focus and felt groggy.  People noticed her symptoms and EMS was called.  Blood sugars read greater than 600.  Patient denies prior history of ever having been in DKA.  She reports she also has really bad pain in her right shoulder and arm.  She reports she fell several months ago but never went to get any evaluation.  She reports it hurts a lot at night and she can even use arm to pick up her blankets to move them.    Home Medications Prior to Admission medications   Medication Sig Start Date End Date Taking? Authorizing Provider  Accu-Chek Softclix Lancets lancets Use as directed up to 4 times daily 09/06/21   Saverio Danker, PA-C  acetaminophen (TYLENOL) 500 MG tablet Take 2 tablets (1,000 mg total) by mouth every 6 (six) hours as needed. 09/06/21   Saverio Danker, PA-C  blood glucose meter kit and supplies KIT Dispense based on patient and insurance preference. Can dispense strips and lancets only if patient has a glucometer at home. Check CBGs 4 times daily before each meal and before bedtime 09/06/21   Caren Griffins, MD  Blood Glucose Monitoring Suppl (ACCU-CHEK GUIDE) w/Device KIT Use as directed 09/06/21   Saverio Danker, PA-C  glucose blood (ACCU-CHEK GUIDE) test strip Use as instructed up to 4 times daily 09/06/21   Saverio Danker, PA-C   insulin isophane & regular human KwikPen (NOVOLIN 70/30 KWIKPEN) (70-30) 100 UNIT/ML KwikPen Inject 20 Units into the skin 2 (two) times daily. 09/06/21   Caren Griffins, MD  Insulin Pen Needle 32G X 4 MM MISC Use to inject insulin twice daily. 09/06/21   Caren Griffins, MD  methocarbamol (ROBAXIN) 500 MG tablet Take 2 tablets (1,000 mg total) by mouth every 8 (eight) hours as needed for muscle spasms. 09/06/21   Saverio Danker, PA-C  Multiple Vitamin (MULTIVITAMIN WITH MINERALS) TABS tablet Take 1 tablet by mouth daily. Patient not taking: Reported on 09/03/2021    [provider]  oxyCODONE (OXY IR/ROXICODONE) 5 MG immediate release tablet Take 1/2 tablets (2.5 mg total) by mouth every 4 (four) hours as needed for severe pain. 09/06/21   Saverio Danker, PA-C      Allergies    Ciprofloxacin, Morphine and related, Pork-derived products, and Morphine    Review of Systems   Review of Systems  Physical Exam Updated Vital Signs BP (!) 150/93 (BP Location: Left Arm)   Pulse 90   Temp 98.1 F (36.7 C) (Oral)   Resp 20   LMP 11/29/2016 (Approximate)   SpO2 100%  Physical Exam Constitutional:      Comments: Alert nontoxic clear mental status.  No respiratory distress.  HENT:     Mouth/Throat:     Mouth: Mucous  membranes are dry.     Pharynx: Oropharynx is clear.  Eyes:     Extraocular Movements: Extraocular movements intact.     Pupils: Pupils are equal, round, and reactive to light.  Cardiovascular:     Rate and Rhythm: Normal rate and regular rhythm.  Pulmonary:     Effort: Pulmonary effort is normal.     Breath sounds: Normal breath sounds.  Abdominal:     General: There is no distension.     Palpations: Abdomen is soft.     Tenderness: There is no abdominal tenderness. There is no guarding.  Musculoskeletal:     Comments: No deformity of the upper extremities.  Patient reports a lot of pain with attempted range of motion of the right shoulder.  No effusion at the elbow.   Also reports pain at the right elbow.  Skin is normal.  No overlying rashes or lesions.  Hand is warm and dry with good pulses and normal neurovascular function.  Bilateral lower extremities trace edema, symmetric, no calf tenderness.  Skin:    General: Skin is warm and dry.  Neurological:     General: No focal deficit present.     Mental Status: She is oriented to person, place, and time.     Motor: No weakness.     Coordination: Coordination normal.     ED Results / Procedures / Treatments   Labs (all labs ordered are listed, but only abnormal results are displayed) Labs Reviewed  BASIC METABOLIC PANEL - Abnormal; Notable for the following components:      Result Value   Sodium 129 (*)    Chloride 93 (*)    Glucose, Bld 628 (*)    All other components within normal limits  BLOOD GAS, VENOUS - Abnormal; Notable for the following components:   Bicarbonate 28.6 (*)    All other components within normal limits  CBG MONITORING, ED - Abnormal; Notable for the following components:   Glucose-Capillary >600 (*)    All other components within normal limits  CBC  BETA-HYDROXYBUTYRIC ACID  URINALYSIS, ROUTINE W REFLEX MICROSCOPIC  I-STAT VENOUS BLOOD GAS, ED    EKG None  Radiology No results found.  Procedures Procedures    Medications Ordered in ED Medications  sodium chloride 0.9 % bolus 1,000 mL (has no administration in time range)  insulin aspart (novoLOG) injection 10 Units (has no administration in time range)    ED Course/ Medical Decision Making/ A&P                             Medical Decision Making Amount and/or Complexity of Data Reviewed Labs: ordered. Radiology: ordered.  Risk OTC drugs. Prescription drug management.  Patient presents with hyperglycemia by EMS.  Read was greater than 600.  Patient's symptoms were mental slowing and grogginess.  She was at work when this happened and EMS was contacted.  Patient has been out of insulin and stopped  checking her blood sugars due to inability to treat them.  Will proceed with volume resuscitation with normal saline and lab work to include venous gas and beta hydroxybutyrate.  Patient's mental status is clear.  Clinically she is not showing signs of hyperosmolar coma.  neurologic exam is normal.  No evidence of acute neurologic deficit or strokelike symptoms.  Vital  signs are stable.  Basic metabolic panel blood sugar show 628, sodium 129, normal GFR greater than 60 no anion gap at  9.  Venous gas normal pH 7.3.  With volume resuscitation and 10 units subcu regular insulin, blood glucose is down to 312.  At this time findings most consistent with hyperglycemia due to untreated diabetes.  Review of systems, examination and lab work do not suggest other underlying etiology at this time for severely elevated blood sugar.  Previously been on NovoLog 70/30.  I have ordered her normal evening dose of 15 units.  I discussed getting reestablished with primary care and starting her insulin again.  Patient reports she will do that and I will send her previous prescription to the [facility].  So consultations placed to social work to assist the patient in finding low-cost care options and continued ability to obtain insulin.               Final Clinical Impression(s) / ED Diagnoses Final diagnoses:  Hyperglycemia  Diabetes mellitus type 2, insulin dependent ([place])    Rx / [facility] Orders ED Discharge Orders     None         Charlesetta Shanks, MD 09/27/22 1342

## 2022-09-23 ENCOUNTER — Other Ambulatory Visit (HOSPITAL_COMMUNITY): Payer: Self-pay

## 2022-09-23 LAB — CBG MONITORING, ED: Glucose-Capillary: 312 mg/dL — ABNORMAL HIGH (ref 70–99)

## 2022-09-23 MED ORDER — HUMULIN 70/30 KWIKPEN (70-30) 100 UNIT/ML ~~LOC~~ SUPN
20.0000 [IU] | PEN_INJECTOR | Freq: Two times a day (BID) | SUBCUTANEOUS | 2 refills | Status: DC
  Filled 2022-09-23: qty 15, 38d supply, fill #0
  Filled 2022-09-29: qty 9, 23d supply, fill #0

## 2022-09-23 MED ORDER — ACCU-CHEK SOFTCLIX LANCETS MISC
5 refills | Status: AC
  Filled 2022-09-23: qty 100, fill #0
  Filled 2022-09-29: qty 100, 25d supply, fill #0

## 2022-09-23 MED ORDER — BLOOD GLUCOSE MONITOR KIT
PACK | 0 refills | Status: AC
  Filled 2022-09-23: qty 1, fill #0
  Filled 2022-09-29: qty 1, 30d supply, fill #0

## 2022-09-23 NOTE — ED Notes (Signed)
Patient given discharge instructions and follow up care. Patient verbalized understanding. IV removed with catheter intact. Patient ambulatory out of ED.

## 2022-09-23 NOTE — Discharge Instructions (Addendum)
1.  Prescriptions have been sent to the [facility] outpatient pharmacy for insulin and test strips.  A consult has been placed to social work to help with managing cost and establishing ongoing medical care.  If you have not heard from social work tomorrow, follow-up with the main hospital and request to speak with Education officer, museum.  2.  Return to the emergency department if you have recurrence of symptoms of any confusion, very elevated blood sugar, vomiting or other concerning changes.  3.  A resource guide has been included in your discharge instructions that includes facilities that provide low-cost medical care.

## 2022-09-24 ENCOUNTER — Telehealth: Payer: Self-pay

## 2022-09-24 NOTE — Telephone Encounter (Signed)
Called patient at number on chart no answer and could not leave a voice message. Texted the patient to return call to discuss medications. They are currently at [facility]. Reinstated her medication assistance MATCH  and if calls back will encourage PCP at [facility].

## 2022-09-29 ENCOUNTER — Other Ambulatory Visit (HOSPITAL_COMMUNITY): Payer: Self-pay

## 2022-09-29 ENCOUNTER — Telehealth: Payer: Self-pay

## 2022-09-29 MED ORDER — GLUCOSE BLOOD VI STRP
ORAL_STRIP | 0 refills | Status: AC
  Filled 2022-09-29: qty 100, 25d supply, fill #0

## 2022-09-29 MED ORDER — INSULIN PEN NEEDLE 32G X 4 MM MISC
0 refills | Status: DC
  Filled 2022-09-29: qty 100, 30d supply, fill #0

## 2022-09-29 NOTE — Telephone Encounter (Signed)
Patient called to say medications were 150 dollars. Notified [facility] who stated they had a MATCH, but the patient never picked them up. Asked if they could reinstate the medications and cost. Communicating with patient via text with updates per patient choice.

## 2022-09-30 ENCOUNTER — Other Ambulatory Visit (HOSPITAL_COMMUNITY): Payer: Self-pay

## 2023-01-10 ENCOUNTER — Ambulatory Visit: Payer: Self-pay | Admitting: Medical

## 2023-06-21 ENCOUNTER — Encounter (HOSPITAL_COMMUNITY): Payer: Self-pay | Admitting: *Deleted

## 2023-06-21 ENCOUNTER — Other Ambulatory Visit: Payer: Self-pay

## 2023-06-21 ENCOUNTER — Observation Stay (HOSPITAL_COMMUNITY)
Admission: EM | Admit: 2023-06-21 | Discharge: 2023-06-24 | Disposition: A | Discharge status: Home / Self Care | Payer: Commercial Managed Care - HMO | Attending: Internal Medicine | Admitting: Internal Medicine

## 2023-06-21 DIAGNOSIS — R739 Hyperglycemia, unspecified: Secondary | ICD-10-CM

## 2023-06-21 DIAGNOSIS — B372 Candidiasis of skin and nail: Secondary | ICD-10-CM | POA: Diagnosis not present

## 2023-06-21 DIAGNOSIS — E669 Obesity, unspecified: Secondary | ICD-10-CM | POA: Insufficient documentation

## 2023-06-21 DIAGNOSIS — E876 Hypokalemia: Secondary | ICD-10-CM | POA: Insufficient documentation

## 2023-06-21 DIAGNOSIS — L0390 Cellulitis, unspecified: Secondary | ICD-10-CM | POA: Diagnosis present

## 2023-06-21 DIAGNOSIS — E1165 Type 2 diabetes mellitus with hyperglycemia: Secondary | ICD-10-CM | POA: Insufficient documentation

## 2023-06-21 DIAGNOSIS — E119 Type 2 diabetes mellitus without complications: Secondary | ICD-10-CM | POA: Insufficient documentation

## 2023-06-21 DIAGNOSIS — E871 Hypo-osmolality and hyponatremia: Secondary | ICD-10-CM | POA: Insufficient documentation

## 2023-06-21 DIAGNOSIS — L03115 Cellulitis of right lower limb: Secondary | ICD-10-CM | POA: Diagnosis present

## 2023-06-21 DIAGNOSIS — Z91148 Patient's other noncompliance with medication regimen for other reason: Secondary | ICD-10-CM | POA: Insufficient documentation

## 2023-06-21 LAB — CBC WITH DIFFERENTIAL/PLATELET
Abs Immature Granulocytes: 0.02 10*3/uL (ref 0.00–0.07)
Basophils Absolute: 0 10*3/uL (ref 0.0–0.1)
Basophils Relative: 0 %
Eosinophils Absolute: 0.1 10*3/uL (ref 0.0–0.5)
Eosinophils Relative: 2 %
HCT: 38.8 % (ref 36.0–46.0)
Hemoglobin: 13 g/dL (ref 12.0–15.0)
Immature Granulocytes: 0 %
Lymphocytes Relative: 27 %
Lymphs Abs: 2 10*3/uL (ref 0.7–4.0)
MCH: 29.7 pg (ref 26.0–34.0)
MCHC: 33.5 g/dL (ref 30.0–36.0)
MCV: 88.8 fL (ref 80.0–100.0)
Monocytes Absolute: 0.6 10*3/uL (ref 0.1–1.0)
Monocytes Relative: 9 %
Neutro Abs: 4.6 10*3/uL (ref 1.7–7.7)
Neutrophils Relative %: 62 %
Platelets: 318 10*3/uL (ref 150–400)
RBC: 4.37 MIL/uL (ref 3.87–5.11)
RDW: 12.2 % (ref 11.5–15.5)
WBC: 7.3 10*3/uL (ref 4.0–10.5)
nRBC: 0 % (ref 0.0–0.2)

## 2023-06-21 LAB — COMPREHENSIVE METABOLIC PANEL
ALT: 48 U/L — ABNORMAL HIGH (ref 0–44)
AST: 23 U/L (ref 15–41)
Albumin: 3.6 g/dL (ref 3.5–5.0)
Alkaline Phosphatase: 201 U/L — ABNORMAL HIGH (ref 38–126)
Anion gap: 10 (ref 5–15)
BUN: 16 mg/dL (ref 6–20)
CO2: 24 mmol/L (ref 22–32)
Calcium: 9 mg/dL (ref 8.9–10.3)
Chloride: 94 mmol/L — ABNORMAL LOW (ref 98–111)
Creatinine, Ser: 0.67 mg/dL (ref 0.44–1.00)
GFR, Estimated: >60 mL/min (ref >60)
Glucose, Bld: 564 mg/dL (ref 70–99)
Potassium: 3.2 mmol/L — ABNORMAL LOW (ref 3.5–5.1)
Sodium: 128 mmol/L — ABNORMAL LOW (ref 135–145)
Total Bilirubin: 0.5 mg/dL (ref <1.2)
Total Protein: 7.2 g/dL (ref 6.5–8.1)

## 2023-06-21 LAB — URINALYSIS, W/ REFLEX TO CULTURE (INFECTION SUSPECTED)
Bacteria, UA: NONE SEEN
Bilirubin Urine: NEGATIVE
Glucose, UA: >=500 mg/dL — AB
Hgb urine dipstick: NEGATIVE
Ketones, ur: NEGATIVE mg/dL
Leukocytes,Ua: NEGATIVE
Nitrite: NEGATIVE
Protein, ur: NEGATIVE mg/dL
Specific Gravity, Urine: 1.029 (ref 1.005–1.030)
pH: 7 (ref 5.0–8.0)

## 2023-06-21 LAB — CBG MONITORING, ED
Glucose-Capillary: 572 mg/dL (ref 70–99)
Glucose-Capillary: 548 mg/dL (ref 70–99)

## 2023-06-21 LAB — I-STAT CG4 LACTIC ACID, ED: Lactic Acid, Venous: 1 mmol/L (ref 0.5–1.9)

## 2023-06-21 NOTE — ED Triage Notes (Addendum)
Pt ambulatory to triage c/o cellulitis in the right lower leg. Started having swelling in the right lower leg about 3 days ago. Small clear blisters on the inner right leg, unsure if she has been bit at some point by a bug. Denies fevers. Diabetic- pt says that she has been out of her insulin "for months" and her sugar may be high

## 2023-06-22 ENCOUNTER — Other Ambulatory Visit (HOSPITAL_COMMUNITY): Payer: Self-pay

## 2023-06-22 ENCOUNTER — Observation Stay (HOSPITAL_BASED_OUTPATIENT_CLINIC_OR_DEPARTMENT_OTHER): Payer: Commercial Managed Care - HMO

## 2023-06-22 DIAGNOSIS — M7989 Other specified soft tissue disorders: Secondary | ICD-10-CM | POA: Diagnosis not present

## 2023-06-22 DIAGNOSIS — L03115 Cellulitis of right lower limb: Secondary | ICD-10-CM | POA: Diagnosis not present

## 2023-06-22 LAB — I-STAT CG4 LACTIC ACID, ED: Lactic Acid, Venous: 0.8 mmol/L (ref 0.5–1.9)

## 2023-06-22 LAB — CBG MONITORING, ED
Glucose-Capillary: 424 mg/dL — ABNORMAL HIGH (ref 70–99)
Glucose-Capillary: 336 mg/dL — ABNORMAL HIGH (ref 70–99)
Glucose-Capillary: 336 mg/dL — ABNORMAL HIGH (ref 70–99)
Glucose-Capillary: 287 mg/dL — ABNORMAL HIGH (ref 70–99)
Glucose-Capillary: 394 mg/dL — ABNORMAL HIGH (ref 70–99)

## 2023-06-22 LAB — HIV ANTIBODY (ROUTINE TESTING W REFLEX): HIV Screen 4th Generation wRfx: NONREACTIVE

## 2023-06-22 LAB — GLUCOSE, CAPILLARY
Glucose-Capillary: 428 mg/dL — ABNORMAL HIGH (ref 70–99)
Glucose-Capillary: 185 mg/dL — ABNORMAL HIGH (ref 70–99)
Glucose-Capillary: 125 mg/dL — ABNORMAL HIGH (ref 70–99)

## 2023-06-22 MED ORDER — CEFAZOLIN SODIUM-DEXTROSE 1-4 GM/50ML-% IV SOLN
1.0000 g | Freq: Three times a day (TID) | INTRAVENOUS | Status: DC
  Administered 2023-06-22 – 2023-06-24 (×7): 1 g via INTRAVENOUS
  Filled 2023-06-22 (×7): qty 50

## 2023-06-22 MED ORDER — ACETAMINOPHEN 650 MG RE SUPP: 650.0000 mg | Freq: Four times a day (QID) | RECTAL | Status: DC | PRN

## 2023-06-22 MED ORDER — INSULIN ASPART 100 UNIT/ML IJ SOLN
0.0000 [IU] | Freq: Every day | INTRAMUSCULAR | Status: DC
  Filled 2023-06-22: qty 0.05

## 2023-06-22 MED ORDER — POTASSIUM CHLORIDE CRYS ER 20 MEQ PO TBCR
40.0000 meq | EXTENDED_RELEASE_TABLET | Freq: Once | ORAL | Status: AC
  Administered 2023-06-22: 40 meq via ORAL
  Filled 2023-06-22: qty 2

## 2023-06-22 MED ORDER — LORAZEPAM 1 MG PO TABS
1.0000 mg | ORAL_TABLET | ORAL | Status: DC | PRN
  Filled 2023-06-22 (×2): qty 1

## 2023-06-22 MED ORDER — INSULIN ISOPHANE & REGULAR (HUMAN 70-30)100 UNIT/ML KWIKPEN: 20.0000 [IU] | PEN_INJECTOR | Freq: Two times a day (BID) | SUBCUTANEOUS | Status: DC

## 2023-06-22 MED ORDER — NYSTATIN 100000 UNIT/GM EX POWD
Freq: Two times a day (BID) | CUTANEOUS | Status: DC
  Administered 2023-06-22 – 2023-06-23 (×2): 1 via TOPICAL
  Filled 2023-06-22: qty 15

## 2023-06-22 MED ORDER — INSULIN ASPART 100 UNIT/ML IV SOLN
5.0000 [IU] | Freq: Once | INTRAVENOUS | Status: AC
  Administered 2023-06-22: 5 [IU] via INTRAVENOUS
  Filled 2023-06-22: qty 0.05

## 2023-06-22 MED ORDER — ONDANSETRON HCL 4 MG PO TABS: 4.0000 mg | ORAL_TABLET | Freq: Four times a day (QID) | ORAL | Status: DC | PRN

## 2023-06-22 MED ORDER — RIVAROXABAN 15 MG PO TABS
15.0000 mg | ORAL_TABLET | Freq: Once | ORAL | Status: AC
  Administered 2023-06-22: 15 mg via ORAL
  Filled 2023-06-22: qty 1

## 2023-06-22 MED ORDER — ONDANSETRON HCL 4 MG/2ML IJ SOLN
4.0000 mg | Freq: Four times a day (QID) | INTRAMUSCULAR | Status: DC | PRN
  Administered 2023-06-23: 4 mg via INTRAVENOUS
  Filled 2023-06-22: qty 2

## 2023-06-22 MED ORDER — ACETAMINOPHEN 325 MG PO TABS
650.0000 mg | ORAL_TABLET | Freq: Four times a day (QID) | ORAL | Status: DC | PRN
  Administered 2023-06-22 – 2023-06-23 (×2): 650 mg via ORAL
  Filled 2023-06-22 (×2): qty 2

## 2023-06-22 MED ORDER — INSULIN ASPART PROT & ASPART (70-30 MIX) 100 UNIT/ML ~~LOC~~ SUSP: 20.0000 [IU] | Freq: Two times a day (BID) | SUBCUTANEOUS | Status: DC

## 2023-06-22 MED ORDER — SODIUM CHLORIDE 0.9 % IV SOLN
2.0000 g | Freq: Once | INTRAVENOUS | Status: AC
  Administered 2023-06-22: 2 g via INTRAVENOUS
  Filled 2023-06-22: qty 20

## 2023-06-22 MED ORDER — INSULIN ASPART 100 UNIT/ML IJ SOLN
0.0000 [IU] | Freq: Four times a day (QID) | INTRAMUSCULAR | Status: DC
Start: 2023-06-22 — End: 2023-06-22
  Filled 2023-06-22: qty 0.09

## 2023-06-22 MED ORDER — RIVAROXABAN 10 MG PO TABS
10.0000 mg | ORAL_TABLET | Freq: Every day | ORAL | Status: DC
  Administered 2023-06-22 – 2023-06-24 (×3): 10 mg via ORAL
  Filled 2023-06-22 (×3): qty 1

## 2023-06-22 MED ORDER — HYDROCODONE-ACETAMINOPHEN 5-325 MG PO TABS
1.0000 | ORAL_TABLET | Freq: Once | ORAL | Status: AC
  Administered 2023-06-22: 1 via ORAL
  Filled 2023-06-22: qty 1

## 2023-06-22 MED ORDER — ALBUTEROL SULFATE (2.5 MG/3ML) 0.083% IN NEBU: 2.5000 mg | INHALATION_SOLUTION | RESPIRATORY_TRACT | Status: DC | PRN

## 2023-06-22 MED ORDER — TRAZODONE HCL 50 MG PO TABS: 25.0000 mg | ORAL_TABLET | Freq: Every evening | ORAL | Status: DC | PRN

## 2023-06-22 MED ORDER — LORAZEPAM 1 MG PO TABS
1.0000 mg | ORAL_TABLET | ORAL | Status: DC | PRN
  Administered 2023-06-22 – 2023-06-24 (×3): 1 mg via ORAL
  Filled 2023-06-22: qty 1

## 2023-06-22 MED ORDER — OXYCODONE HCL 5 MG PO TABS
2.5000 mg | ORAL_TABLET | ORAL | Status: DC | PRN
  Administered 2023-06-22 – 2023-06-23 (×3): 2.5 mg via ORAL
  Filled 2023-06-22 (×3): qty 1

## 2023-06-22 MED ORDER — INSULIN ASPART 100 UNIT/ML IJ SOLN
0.0000 [IU] | Freq: Three times a day (TID) | INTRAMUSCULAR | Status: DC
  Administered 2023-06-22: 15 [IU] via SUBCUTANEOUS
  Administered 2023-06-23 (×2): 11 [IU] via SUBCUTANEOUS
  Administered 2023-06-23: 8 [IU] via SUBCUTANEOUS
  Administered 2023-06-24: 11 [IU] via SUBCUTANEOUS
  Filled 2023-06-22: qty 0.15

## 2023-06-22 MED ORDER — INSULIN ASPART PROT & ASPART (70-30 MIX) 100 UNIT/ML ~~LOC~~ SUSP
20.0000 [IU] | Freq: Two times a day (BID) | SUBCUTANEOUS | Status: DC
  Administered 2023-06-22 (×2): 20 [IU] via SUBCUTANEOUS
  Filled 2023-06-22: qty 10

## 2023-06-22 MED ORDER — INSULIN ASPART 100 UNIT/ML IV SOLN
10.0000 [IU] | Freq: Once | INTRAVENOUS | Status: DC
  Filled 2023-06-22: qty 0.1

## 2023-06-22 MED ORDER — LACTATED RINGERS IV BOLUS
1000.0000 mL | Freq: Once | INTRAVENOUS | Status: AC
  Administered 2023-06-22: 1000 mL via INTRAVENOUS

## 2023-06-22 NOTE — ED Notes (Signed)
ED TO INPATIENT HANDOFF REPORT  Name/Age/Gender Karen Roberts 52 y.o. female  Code Status    Code Status Orders  (From admission, onward)           Start     Ordered   06/22/23 0804  Full code  Continuous       Question:  By:  Answer:  Consent: discussion documented in EHR   06/22/23 0803           Code Status History     Date Active Date Inactive Code Status Order ID Comments User Context   09/03/2021 0905 09/06/2021 1706 Full Code [id number]  Karen Monks, MD ED   11/11/2020 0401 11/13/2020 1840 Full Code [id number]  Karen Gills, DO Inpatient   12/12/2011 1146 12/19/2011 1058 Full Code [id number]  Karen Cape, PA ED   12/04/2011 1756 12/08/2011 1705 Full Code [id number]  Karen Dy, RN ED   11/29/2011 1742 12/01/2011 1345 Full Code [id number]  [place], Jamse Arn, RN Inpatient       Home/SNF/Other Home  Chief Complaint Cellulitis [L03.90]  Level of Care/Admitting Diagnosis ED Disposition     ED Disposition  Admit   Condition  --   Comment  Hospital Area: [facility] [[id number]]  Level of Care: Med-Surg [16]  May place patient in observation at [facility] or [facility] if equivalent level of care is available:: Yes  Covid Evaluation: Asymptomatic - no recent exposure (last 10 days) testing not required  Diagnosis: Cellulitis [192319]  Admitting Physician: Karen Roberts [[id number]]  Attending Physician: Karen Roberts, MIR Karen Roberts [[id number]]          Medical History Past Medical History:  Diagnosis Date   Allergy    Anxiety    Diabetes mellitus without complication (HCC)    Headache(784.0)    "every once in awhile"   Hepatic steatosis 02/24/11   Ligament tear    left; "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   PONV (postoperative nausea and vomiting)    Postoperative bile leak    Sepsis (HCC)     Allergies Allergies  Allergen Reactions   Ciprofloxacin Dermatitis    "dr's think it may have caused mastitis"   Morphine And Codeine Nausea Only     Nausea and headache   Pork-Derived Products Shortness Of Breath and Nausea And Vomiting   Morphine Other (See Comments)    Headaches, itching, anger    IV Location/Drains/Wounds Patient Lines/Drains/Airways Status     Active Line/Drains/Airways     Name Placement date Placement time Site Days   Peripheral IV 06/22/23 20 G Anterior;Left;Upper Arm 06/22/23  0204  Arm  less than 1   Biliary Tube Cook slip-coat 12 Fr. 01/06/12  1200  --  4185   GI Stent 10 Fr. 01/05/12  1635  --  4186            Labs/Imaging Results for orders placed or performed during the hospital encounter of 06/21/23 (from the past 48 hour(s))  CBG monitoring, ED     Status: Abnormal   Collection Time: 06/21/23  7:45 PM  Result Value Ref Range   Glucose-Capillary 572 (HH) 70 - 99 mg/dL    Comment: Glucose reference range applies only to samples taken after fasting for at least 8 hours.   Comment 1 Notify RN   Comprehensive metabolic panel     Status: Abnormal   Collection Time: 06/21/23  7:58 PM  Result Value Ref Range   Sodium 128 (L) 135 -  145 mmol/L   Potassium 3.2 (L) 3.5 - 5.1 mmol/L   Chloride 94 (L) 98 - 111 mmol/L   CO2 24 22 - 32 mmol/L   Glucose, Bld 564 (HH) 70 - 99 mg/dL    Comment: CRITICAL RESULT CALLED TO, READ BACK BY AND VERIFIED WITH T. HAMPTON, RN 06/21/23 2032 BY K. DAVIS Glucose reference range applies only to samples taken after fasting for at least 8 hours.    BUN 16 6 - 20 mg/dL   Creatinine, Ser 1.61 0.44 - 1.00 mg/dL   Calcium 9.0 8.9 - 09.6 mg/dL   Total Protein 7.2 6.5 - 8.1 g/dL   Albumin 3.6 3.5 - 5.0 g/dL   AST 23 15 - 41 U/L   ALT 48 (H) 0 - 44 U/L   Alkaline Phosphatase 201 (H) 38 - 126 U/L   Total Bilirubin 0.5 <1.2 mg/dL   GFR, Estimated >04 >54 mL/min    Comment: (NOTE) Calculated using the CKD-EPI Creatinine Equation (2021)    Anion gap 10 5 - 15    Comment: Performed at [facility], [address], [place], [place] [address]  CBC  with Differential     Status: None   Collection Time: 06/21/23  7:58 PM  Result Value Ref Range   WBC 7.3 4.0 - 10.5 K/uL   RBC 4.37 3.87 - 5.11 MIL/uL   Hemoglobin 13.0 12.0 - 15.0 g/dL   HCT 91.4 78.2 - 95.6 %   MCV 88.8 80.0 - 100.0 fL   MCH 29.7 26.0 - 34.0 pg   MCHC 33.5 30.0 - 36.0 g/dL   RDW 21.3 08.6 - 57.8 %   Platelets 318 150 - 400 K/uL   nRBC 0.0 0.0 - 0.2 %   Neutrophils Relative % 62 %   Neutro Abs 4.6 1.7 - 7.7 K/uL   Lymphocytes Relative 27 %   Lymphs Abs 2.0 0.7 - 4.0 K/uL   Monocytes Relative 9 %   Monocytes Absolute 0.6 0.1 - 1.0 K/uL   Eosinophils Relative 2 %   Eosinophils Absolute 0.1 0.0 - 0.5 K/uL   Basophils Relative 0 %   Basophils Absolute 0.0 0.0 - 0.1 K/uL   Immature Granulocytes 0 %   Abs Immature Granulocytes 0.02 0.00 - 0.07 K/uL    Comment: Performed at [facility], [address], [place], [place] [address]  I-Stat Lactic Acid, ED     Status: None   Collection Time: 06/21/23  8:04 PM  Result Value Ref Range   Lactic Acid, Venous 1.0 0.5 - 1.9 mmol/L  CBG monitoring, ED     Status: Abnormal   Collection Time: 06/21/23 10:57 PM  Result Value Ref Range   Glucose-Capillary 548 (HH) 70 - 99 mg/dL    Comment: Glucose reference range applies only to samples taken after fasting for at least 8 hours.   Comment 1 Notify RN   Urinalysis, w/ Reflex to Culture (Infection Suspected) -Urine, Clean Catch     Status: Abnormal   Collection Time: 06/21/23 11:07 PM  Result Value Ref Range   Specimen Source URINE, CLEAN CATCH    Color, Urine STRAW (A) YELLOW   APPearance CLEAR CLEAR   Specific Gravity, Urine 1.029 1.005 - 1.030   pH 7.0 5.0 - 8.0   Glucose, UA >=500 (A) NEGATIVE mg/dL   Hgb urine dipstick NEGATIVE NEGATIVE   Bilirubin Urine NEGATIVE NEGATIVE   Ketones, ur NEGATIVE NEGATIVE mg/dL   Protein, ur NEGATIVE NEGATIVE  mg/dL   Nitrite NEGATIVE NEGATIVE   Leukocytes,Ua NEGATIVE NEGATIVE   RBC / HPF 0-5 0 - 5 RBC/hpf   WBC, UA  0-5 0 - 5 WBC/hpf    Comment:        Reflex urine culture not performed if WBC <=10, OR if Squamous epithelial cells >5. If Squamous epithelial cells >5 suggest recollection.    Bacteria, UA NONE SEEN NONE SEEN   Squamous Epithelial / HPF 0-5 0 - 5 /HPF    Comment: Performed at [facility], [address], [place], [place] [address]  CBG monitoring, ED     Status: Abnormal   Collection Time: 06/22/23  2:01 AM  Result Value Ref Range   Glucose-Capillary 424 (H) 70 - 99 mg/dL    Comment: Glucose reference range applies only to samples taken after fasting for at least 8 hours.  I-Stat Lactic Acid, ED     Status: None   Collection Time: 06/22/23  2:15 AM  Result Value Ref Range   Lactic Acid, Venous 0.8 0.5 - 1.9 mmol/L  CBG monitoring, ED     Status: Abnormal   Collection Time: 06/22/23  2:49 AM  Result Value Ref Range   Glucose-Capillary 336 (H) 70 - 99 mg/dL    Comment: Glucose reference range applies only to samples taken after fasting for at least 8 hours.  CBG monitoring, ED     Status: Abnormal   Collection Time: 06/22/23  4:32 AM  Result Value Ref Range   Glucose-Capillary 336 (H) 70 - 99 mg/dL    Comment: Glucose reference range applies only to samples taken after fasting for at least 8 hours.  CBG monitoring, ED     Status: Abnormal   Collection Time: 06/22/23  8:45 AM  Result Value Ref Range   Glucose-Capillary 287 (H) 70 - 99 mg/dL    Comment: Glucose reference range applies only to samples taken after fasting for at least 8 hours.  CBG monitoring, ED     Status: Abnormal   Collection Time: 06/22/23 11:59 AM  Result Value Ref Range   Glucose-Capillary 394 (H) 70 - 99 mg/dL    Comment: Glucose reference range applies only to samples taken after fasting for at least 8 hours.   VAS [place] LOWER EXTREMITY VENOUS (DVT) (ONLY [facility] & [facility])  Result Date: 06/22/2023  Lower Venous DVT Study Patient Name:  Karen Roberts  Date of Exam:   06/22/2023 Medical Rec #:  [id number]       Accession #:    [id number] Date of Birth: 1971/06/27      Patient Gender: F Patient Age:   1 years Exam Location:  [facility] Procedure:      VAS [place] LOWER EXTREMITY VENOUS (DVT) Referring Phys: Dione Booze --------------------------------------------------------------------------------  Indications: Swelling, and Edema.  Risk Factors: Past pregnancy. Performing Technologist: Shona Simpson  Examination Guidelines: A complete evaluation includes B-mode imaging, spectral Doppler, color Doppler, and power Doppler as needed of all accessible portions of each vessel. Bilateral testing is considered an integral part of a complete examination. Limited examinations for reoccurring indications may be performed as noted. The reflux portion of the exam is performed with the patient in reverse Trendelenburg.  +---------+---------------+---------+-----------+----------+--------------+ RIGHT    CompressibilityPhasicitySpontaneityPropertiesThrombus Aging +---------+---------------+---------+-----------+----------+--------------+ CFV      Full           Yes      Yes                                 +---------+---------------+---------+-----------+----------+--------------+  SFJ      Full                                                        +---------+---------------+---------+-----------+----------+--------------+ FV Prox  Full                                                        +---------+---------------+---------+-----------+----------+--------------+ FV Mid   Full                                                        +---------+---------------+---------+-----------+----------+--------------+ FV DistalFull                                                        +---------+---------------+---------+-----------+----------+--------------+ PFV      Full                                                         +---------+---------------+---------+-----------+----------+--------------+ POP      Full           Yes      Yes                                 +---------+---------------+---------+-----------+----------+--------------+ PTV      Full                                                        +---------+---------------+---------+-----------+----------+--------------+ PERO     Full                                                        +---------+---------------+---------+-----------+----------+--------------+   +---------+---------------+---------+-----------+----------+---------------+ LEFT     CompressibilityPhasicitySpontaneityPropertiesThrombus Aging  +---------+---------------+---------+-----------+----------+---------------+ CFV      Full           Yes      Yes                                  +---------+---------------+---------+-----------+----------+---------------+ SFJ      Full                                                         +---------+---------------+---------+-----------+----------+---------------+  FV Prox  Full                                                         +---------+---------------+---------+-----------+----------+---------------+ FV Mid   Full                                                         +---------+---------------+---------+-----------+----------+---------------+ FV Distal               Yes      Yes                  Patent by color +---------+---------------+---------+-----------+----------+---------------+ PFV      Full                                                         +---------+---------------+---------+-----------+----------+---------------+ POP      Full           Yes      Yes                                  +---------+---------------+---------+-----------+----------+---------------+ PTV      Full                                                          +---------+---------------+---------+-----------+----------+---------------+ PERO     Full                                                         +---------+---------------+---------+-----------+----------+---------------+    Summary: BILATERAL: - No evidence of deep vein thrombosis seen in the lower extremities, bilaterally. -No evidence of popliteal cyst, bilaterally.   *See table(s) above for measurements and observations.    Preliminary     Pending Labs Unresulted Labs (From admission, onward)     Start     Ordered   06/23/23 0500  Basic metabolic panel  Tomorrow morning,   R        06/22/23 0803   06/23/23 0500  CBC  Tomorrow morning,   R        06/22/23 0803   06/22/23 0803  HIV Antibody (routine testing w rflx)  (HIV Antibody (Routine testing w reflex) panel)  Once,   R        06/22/23 0803   06/22/23 0226  Hemoglobin A1c  Once,   URGENT       Comments: To assess prior glycemic control    06/22/23 0226            Vitals/Pain Today's Vitals   06/22/23 1120 06/22/23  1129 06/22/23 1200 06/22/23 1320  BP:   129/84   Pulse:   94   Resp:   18   Temp:  98.1 F (36.7 C)    TempSrc:  Oral    SpO2:   99%   PainSc: 6    Asleep    Isolation Precautions No active isolations  Medications Medications  ceFAZolin (ANCEF) IVPB 1 g/50 mL premix (1 g Intravenous New Bag/Given 06/22/23 1322)  oxyCODONE (Oxy IR/ROXICODONE) immediate release tablet 2.5 mg (2.5 mg Oral Given 06/22/23 1029)  acetaminophen (TYLENOL) tablet 650 mg (650 mg Oral Given 06/22/23 1029)    Or  acetaminophen (TYLENOL) suppository 650 mg ( Rectal See Alternative 06/22/23 1029)  traZODone (DESYREL) tablet 25 mg (has no administration in time range)  ondansetron (ZOFRAN) tablet 4 mg (has no administration in time range)    Or  ondansetron (ZOFRAN) injection 4 mg (has no administration in time range)  albuterol (PROVENTIL) (2.5 MG/3ML) 0.083% nebulizer solution 2.5 mg (has no administration in time range)   nystatin (MYCOSTATIN/NYSTOP) topical powder ( Topical Given 06/22/23 1120)  LORazepam (ATIVAN) tablet 1 mg ( Oral See Alternative 06/22/23 1144)    Or  LORazepam (ATIVAN) tablet 1 mg (1 mg Oral Given 06/22/23 1144)  rivaroxaban (XARELTO) tablet 10 mg (10 mg Oral Given 06/22/23 1249)  insulin aspart (novoLOG) injection 0-15 Units (has no administration in time range)  insulin aspart (novoLOG) injection 0-5 Units (has no administration in time range)  insulin aspart protamine- aspart (NOVOLOG MIX 70/30) injection 20 Units (20 Units Subcutaneous Given 06/22/23 1249)  cefTRIAXone (ROCEPHIN) 2 g in sodium chloride 0.9 % 100 mL IVPB (0 g Intravenous Stopped 06/22/23 0350)  Rivaroxaban (XARELTO) tablet 15 mg (15 mg Oral Given 06/22/23 0147)  potassium chloride SA (KLOR-CON M) CR tablet 40 mEq (40 mEq Oral Given 06/22/23 0146)  HYDROcodone-acetaminophen (NORCO/VICODIN) 5-325 MG per tablet 1 tablet (1 tablet Oral Given 06/22/23 0217)  insulin aspart (novoLOG) injection 5 Units (5 Units Intravenous Given 06/22/23 0220)  lactated ringers bolus 1,000 mL (0 mLs Intravenous Stopped 06/22/23 0428)    Mobility walks

## 2023-06-22 NOTE — ED Provider Notes (Addendum)
[place] EMERGENCY DEPARTMENT AT [facility] Provider Note   CSN: [id number] Arrival date & time: 06/21/23  1921     History  Chief Complaint  Patient presents with   Leg Swelling    Karen Roberts is a 52 y.o. female.  The history is provided by the patient.  She has history of diabetes, hepatic steatosis and comes in because of pain and swelling of her right lower leg.  She thinks she was bitten by something about 3 or 4 days ago, and has noted some redness and swelling and is concerned that she has cellulitis.  Also, she states that she has not taken her insulin in several months because of lack of medical insurance.  She does endorse urinary frequency and polydipsia.  She denies fever but has had chills.  She is concerned about possible sepsis.  She denies chest pain or dyspnea.   Home Medications Prior to Admission medications   Medication Sig Start Date End Date Taking? Authorizing Provider  Accu-Chek Softclix Lancets lancets Use as directed up to 4 times daily 09/23/22   Arby Barrette, MD  acetaminophen (TYLENOL) 500 MG tablet Take 2 tablets (1,000 mg total) by mouth every 6 (six) hours as needed. 09/06/21   Barnetta Chapel, PA-C  blood glucose meter kit and supplies KIT Dispense based on patient and insurance preference. Can dispense strips and lancets only if patient has a glucometer at home. Check CBGs 4 times daily before each meal and before bedtime 09/23/22   Arby Barrette, MD  Blood Glucose Monitoring Suppl (ACCU-CHEK GUIDE) w/Device KIT Use as directed 09/06/21   Barnetta Chapel, PA-C  glucose blood (ACCU-CHEK GUIDE) test strip Use as instructed up to 4 times daily 09/06/21   Barnetta Chapel, PA-C  glucose blood test strip USe as directed up to 4 times daily 09/22/22   Arby Barrette, MD  insulin isophane & regular human KwikPen (HUMULIN 70/30 KWIKPEN) (70-30) 100 UNIT/ML KwikPen Inject 20 Units into the skin 2 (two) times daily. 09/23/22   Arby Barrette, MD   insulin isophane & regular human KwikPen (NOVOLIN 70/30 KWIKPEN) (70-30) 100 UNIT/ML KwikPen Inject 20 Units into the skin 2 (two) times daily. 09/06/21   Leatha Gilding, MD  Insulin Pen Needle 32G X 4 MM MISC Use to inject insulin twice daily. 09/06/21   Leatha Gilding, MD  Insulin Pen Needle 32G X 4 MM MISC Use twice daily with Novolin Stephanie Coup 09/29/22   Arby Barrette, MD  methocarbamol (ROBAXIN) 500 MG tablet Take 2 tablets (1,000 mg total) by mouth every 8 (eight) hours as needed for muscle spasms. 09/06/21   Barnetta Chapel, PA-C  Multiple Vitamin (MULTIVITAMIN WITH MINERALS) TABS tablet Take 1 tablet by mouth daily. Patient not taking: Reported on 09/03/2021    [provider]  oxyCODONE (OXY IR/ROXICODONE) 5 MG immediate release tablet Take 1/2 tablets (2.5 mg total) by mouth every 4 (four) hours as needed for severe pain. 09/06/21   Barnetta Chapel, PA-C      Allergies    Ciprofloxacin, Morphine and codeine, Pork-derived products, and Morphine    Review of Systems   Review of Systems  All other systems reviewed and are negative.   Physical Exam Updated Vital Signs BP (!) 148/98 (BP Location: Left Arm)   Pulse (!) 106   Temp 97.9 F (36.6 C) (Oral)   Resp 18   LMP 11/29/2016 (Approximate)   SpO2 100%  Physical Exam Vitals and nursing note reviewed.   52  year old female, resting comfortably and in no acute distress. Vital signs are significant for elevated blood pressure and mildly elevated heart rate. Oxygen saturation is 100%, which is normal. Head is normocephalic and atraumatic. PERRLA, EOMI. Oropharynx is clear. Neck is nontender and supple without adenopathy. Lungs are clear without rales, wheezes, or rhonchi. Chest is nontender. Heart has regular rate and rhythm without murmur. Abdomen is soft, flat, nontender without masses or hepatosplenomegaly and peristalsis is normoactive. Extremities: There is mild erythema noted to the distal right lower leg with mild to  moderate venous stasis changes present bilaterally.  There is 1+ edema of the left leg and 2+ edema of the right leg.  Right calf circumference is 3 cm greater than left calf circumference.  There is no tenderness of the inguinal fold. Skin is warm and dry without rash. Neurologic: Mental status is normal, cranial nerves are intact, there are no motor or sensory deficits.         ED Results / Procedures / Treatments   Labs (all labs ordered are listed, but only abnormal results are displayed) Labs Reviewed  COMPREHENSIVE METABOLIC PANEL - Abnormal; Notable for the following components:      Result Value   Sodium 128 (*)    Potassium 3.2 (*)    Chloride 94 (*)    Glucose, Bld 564 (*)    ALT 48 (*)    Alkaline Phosphatase 201 (*)    All other components within normal limits  URINALYSIS, W/ REFLEX TO CULTURE (INFECTION SUSPECTED) - Abnormal; Notable for the following components:   Color, Urine STRAW (*)    Glucose, UA >=500 (*)    All other components within normal limits  CBG MONITORING, ED - Abnormal; Notable for the following components:   Glucose-Capillary 572 (*)    All other components within normal limits  CBG MONITORING, ED - Abnormal; Notable for the following components:   Glucose-Capillary 548 (*)    All other components within normal limits  CBC WITH DIFFERENTIAL/PLATELET  I-STAT CG4 LACTIC ACID, ED  I-STAT CG4 LACTIC ACID, ED    EKG None  Radiology No results found.  Procedures Procedures    Medications Ordered in ED Medications - No data to display  ED Course/ Medical Decision Making/ A&P                                 Medical Decision Making Amount and/or Complexity of Data Reviewed Labs: ordered.  Risk OTC drugs. Prescription drug management. Decision regarding hospitalization.   Cellulitis of the right leg, peripheral edema, elevated blood pressure, medication noncompliance.  I have reviewed her laboratory tests, and my interpretation  is severe hyperglycemia, mild hyponatremia commensurate with degree of hypernatremia, mild hypokalemia and I have ordered oral potassium, normal lactic acid level-no evidence of sepsis, normal CBC, urinalysis significant for glycosuria but no evidence of UTI.  I have ordered intravenous insulin for her elevated glucose.  I have ordered intravenous antibiotics for treatment of cellulitis.  Given degree of hyperglycemia, I feel she needs to be treated as an inpatient.  I have also ordered venous Doppler studies to rule out DVT.  I have ordered a dose of oral rivaroxaban to treat possible DVT.  I discussed case with Dr. Janalyn Shy of [facility] hospitalist, who agrees to admit the patient.  CRITICAL CARE Performed by: Dione Booze Total critical care time: 35 minutes Critical care time was exclusive of  separately billable procedures and treating other patients. Critical care was necessary to treat or prevent imminent or life-threatening deterioration. Critical care was time spent personally by me on the following activities: development of treatment plan with patient and/or surrogate as well as nursing, discussions with consultants, evaluation of patient's response to treatment, examination of patient, obtaining history from patient or surrogate, ordering and performing treatments and interventions, ordering and review of laboratory studies, ordering and review of radiographic studies, pulse oximetry and re-evaluation of patient's condition.  Final Clinical Impression(s) / ED Diagnoses Final diagnoses:  Cellulitis of right lower extremity  Hyperglycemia  Noncompliance with medication regimen  Hypokalemia    Rx / DC Orders ED Discharge Orders     None         Dione Booze, MD 06/22/23 3474    Dione Booze, MD 06/22/23 [phone]

## 2023-06-22 NOTE — Hospital Course (Signed)
52 y.o. female with past medical history of diabetes mellitus type 2, hepatic steatosis, transaminitis, and obesity presented to emergency department.  Physician Dr. Preston Fleeting reported that patient has bilateral lower swelling which is unequal and he is concern for DVT.  Also patient has right lower extremity cellulitis. -At presentation to ED patient found tachycardic 106 otherwise hemodynamically stable. -Lab work in the ED  CMP showed elevated blood glucose 564, low potassium 3.2 normal anion gap and normal bicarb level. -Normal lactic acid level. - POC blood glucose 548. - UA blood glucose above 500.  With the concern for lower extremity DVT patient being started treating with Xarelto and bilateral lower extremity ultrasound is pending.  For the treatment for cellulitis patient has been treated with IV ceftriaxone. For the management of hyperglycemic crisis patient received NovoLog 5 units.  Given patient does not have any DKA neither HHS recommended to check POC blood glucose every 4 hours and continue with low sliding scale SSI.  Hospitalist has been contacted for further management of right lower extremity cellulitis, hyperglycemic crisis and concern for right lower extremity DVT.

## 2023-06-22 NOTE — H&P (Signed)
History and Physical  Karen Roberts XBJ:[id number] DOB: February 08, 1971 DOA: 06/21/2023  PCP: Patient, No Pcp Per   Chief Complaint: Leg swelling, chills  HPI: Karen Roberts is a 52 y.o. female with medical history significant for medication noncompliance, hepatic steatosis, insulin-dependent type 2 diabetes being admitted to the hospital with right lower extremity cellulitis.  Patient states that she chronically has lower extremity discomfort especially on her left flank due to a fall last year and associated hematoma.  She is supposed to be on insulin, but has not taken it for many months due to lack of health insurance.  Currently she has health insurance and is getting back in contact with a PCP.  She started to develop right lower extremity pain, knee swelling and associated fevers about 4 days ago.  She feels that her right leg is swollen, from the top of her foot all the way into her hip.  She has been in walking a lot more than usual due to moving recently, denies any recent falls, or injury to her lower extremity.  After evaluation in the ER overnight, she was started on antibiotics, given insulin, as well as a dose of Xarelto for concern of possible DVT.  Review of Systems: Please see HPI for pertinent positives and negatives. A complete 10 system review of systems are otherwise negative.  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 Corliss Skains, MD;  Location: [facility] OR;  Service: General;  Laterality:  N/A;   ERCP  12/14/2011   Procedure: ENDOSCOPIC RETROGRADE CHOLANGIOPANCREATOGRAPHY (ERCP);  Surgeon: Iva Boop, MD;  Location: [facility] OR;  Service: Endoscopy;  Laterality: N/A;   ERCP  01/05/2012   Procedure: ENDOSCOPIC RETROGRADE CHOLANGIOPANCREATOGRAPHY (ERCP);  Surgeon: Iva Boop, MD;  Location: [facility] ORS;  Service: Gastroenterology;  Laterality: N/A;   ESOPHAGOGASTRODUODENOSCOPY  02/07/2012   Procedure: ESOPHAGOGASTRODUODENOSCOPY (EGD);  Surgeon: Iva Boop, MD;  Location: [facility];  Service: Endoscopy;  Laterality: N/A;  endo with stent removal/ no dye needed/may need ercp scope/need xray   INCISION AND DRAINAGE ABSCESS N/A 11/12/2020   Procedure: INCISION AND DRAINAGE ABSCESS;  Surgeon: Ocie Doyne, DMD;  Location: [facility] ORS;  Service: Oral Surgery;  Laterality: N/A;   RLQ abscess drain placement  12/12/11   TOOTH EXTRACTION N/A 11/12/2020   Procedure: DENTAL RESTORATION/EXTRACTIONS;  Surgeon: Ocie Doyne, DMD;  Location: [facility] ORS;  Service: Oral Surgery;  Laterality: N/A;    Social History:  reports that she has never smoked. She has never used smokeless tobacco. She reports current alcohol use. She reports that she does not use drugs.   Allergies  Allergen Reactions   Ciprofloxacin Dermatitis    "dr's think it may have caused mastitis"   Morphine And Codeine Nausea Only    Nausea and headache   Pork-Derived Products Shortness Of Breath and Nausea And Vomiting   Morphine Other (See Comments)    Headaches, itching, anger    Family History  Problem Relation Age of Onset   Lung cancer Maternal Grandmother    Brain cancer Maternal Grandmother    Diabetes Mother    Heart failure Mother    Anesthesia problems Neg Hx    Colon cancer Neg Hx      Prior to Admission medications   Medication Sig Start Date End Date Taking? Authorizing Provider  ibuprofen (ADVIL) 200 MG tablet Take 200 mg by mouth every 6 (six) hours as needed for moderate pain (pain score 4-6).   Yes [provider]  Accu-Chek Softclix Lancets lancets Use as directed up to 4 times daily 09/23/22   Arby Barrette, MD  acetaminophen (TYLENOL) 500 MG tablet Take 2 tablets (1,000 mg total) by mouth every 6 (six) hours as needed. Patient not taking: Reported on 06/22/2023 09/06/21   Barnetta Chapel, PA-C  blood glucose meter kit and supplies KIT Dispense based on patient and insurance preference. Can dispense strips and lancets only if patient has a glucometer at home. Check CBGs 4 times daily before each meal and before bedtime 09/23/22   Arby Barrette, MD  Blood Glucose Monitoring Suppl (ACCU-CHEK GUIDE) w/Device KIT Use as directed 09/06/21   Barnetta Chapel, PA-C  glucose blood (ACCU-CHEK GUIDE) test strip Use as instructed up to 4 times daily 09/06/21   Barnetta Chapel, PA-C  glucose blood test strip USe as directed up to 4 times daily 09/22/22   Arby Barrette, MD  insulin isophane & regular human KwikPen (HUMULIN 70/30 KWIKPEN) (70-30) 100 UNIT/ML KwikPen Inject 20 Units into the skin 2 (two) times daily. Patient not taking: Reported on 06/22/2023 09/23/22   Arby Barrette, MD  insulin isophane & regular human KwikPen (NOVOLIN 70/30 KWIKPEN) (70-30) 100 UNIT/ML KwikPen Inject 20 Units into the skin 2 (two) times daily. Patient not taking: Reported on 06/22/2023 09/06/21   Leatha Gilding, MD  Insulin Pen Needle 32G X 4 MM MISC Use to inject insulin twice daily. 09/06/21   Leatha Gilding, MD  Insulin Pen Needle 32G X 4 MM MISC Use twice daily with Novolin Stephanie Coup 09/29/22   Arby Barrette, MD  methocarbamol (ROBAXIN) 500 MG tablet Take 2 tablets (1,000 mg total) by mouth every 8 (eight) hours as needed for muscle spasms. Patient not taking: Reported on 06/22/2023 09/06/21   Barnetta Chapel, PA-C  oxyCODONE (OXY IR/ROXICODONE) 5 MG immediate release tablet Take 1/2 tablets (2.5 mg total) by mouth every 4 (four) hours as needed for severe pain. Patient not taking: Reported on 06/22/2023 09/06/21   Barnetta Chapel, PA-C    Physical Exam: BP 134/83 (BP Location: Left Arm)   Pulse 80   Temp 97.7 F (36.5 C) (Oral)   Resp 18   LMP 11/29/2016 (Approximate)   SpO2 98%   General:  Alert, oriented, calm, in no acute distress, looks comfortable and nontoxic Eyes: EOMI, clear conjuctivae, white sclerea Neck: supple, no masses, trachea mildline  Cardiovascular: RRR, no murmurs or rubs, no peripheral edema  Respiratory: clear to auscultation bilaterally, no wheezes, no crackles  Abdomen: soft, nontender, nondistended, normal bowel tones heard  Skin: Dry, with erythema and swelling in the right lower extremity as described below.  She also has significant erythema and moist skin under her abdominal skin folds, and into the groin Musculoskeletal: no joint effusions, normal range of motion, she has chronic appearing bilateral venous stasis changes in the lower extremities, but she also has obvious swelling in the entire right lower extremity, with associated warmth and mild erythema,  no significant tenderness, no areas of fluctuance, open sores or drainage Psychiatric: appropriate affect, normal speech  Neurologic: extraocular muscles intact, clear speech, moving all extremities with intact sensorium              Labs on Admission:  Basic Metabolic Panel: Recent Labs  Lab 06/21/23 1958  NA 128*  K 3.2*  CL 94*  CO2 24  GLUCOSE 564*  BUN 16  CREATININE 0.67  CALCIUM 9.0   Liver Function Tests: Recent Labs  Lab 06/21/23 1958  AST 23  ALT 48*  ALKPHOS 201*  BILITOT 0.5  PROT 7.2  ALBUMIN 3.6   No results for input(s): "LIPASE", "AMYLASE" in the last 168 hours. No results for input(s): "AMMONIA" in the last 168 hours. CBC: Recent Labs  Lab 06/21/23 1958  WBC 7.3  NEUTROABS 4.6  HGB 13.0  HCT 38.8  MCV 88.8  PLT 318   Cardiac Enzymes: No results for input(s): "CKTOTAL", "CKMB", "CKMBINDEX", "TROPONINI" in the last 168 hours.  BNP (last 3 results) No results for  input(s): "BNP" in the last 8760 hours.  ProBNP (last 3 results) No results for input(s): "PROBNP" in the last 8760 hours.  CBG: Recent Labs  Lab 06/21/23 1945 06/21/23 2257 06/22/23 0201 06/22/23 0249 06/22/23 0432  GLUCAP 572* 548* 424* 336* 336*    Radiological Exams on Admission: No results found.  Assessment/Plan Karen Roberts is a 52 y.o. female with medical history significant for medication noncompliance, hepatic steatosis, insulin-dependent type 2 diabetes being admitted to the hospital with right lower extremity cellulitis.  Right lower extremity cellulitis-with associated warmth, erythema, and unilateral edema.  She is not septic, not meeting SIRS criteria. -Observation admission -IV Ancef -Pain control as needed  Right lower extremity swelling-possibly due to cellulitis, need to rule out DVT -Continue empiric Xarelto -Follow-up right lower extremity Doppler  Insulin-dependent type 2 diabetes-very poorly controlled, initial blood sugar on arrival 572, without evidence of acidosis.  Now improved with insulin and fluids. -Check hemoglobin A1c -Carb controlled diet -70/30 insulin twice daily -Moderate sliding scale  Hypokalemia-repleted in the ER, recheck with morning labs  Hyponatremia-I suspect this is pseudohyponatremia in the setting of significant hyperglycemia, which has improved.  Patient is asymptomatic.  Will plan to recheck sodium level with morning labs  Intertrigo-likely exacerbated by her poorly controlled diabetes -Nystatin powder to affected area twice daily    Code Status: Full Code  Consults called: None  Admission status: Observation   Time spent: 59 minutes  Karen Janicki Sharlette Dense MD [facility] Pager [phone]  If 7PM-7AM, please contact night-coverage [email or web address] Password [facility]  06/22/2023, 8:04 AM

## 2023-06-22 NOTE — TOC Benefit Eligibility Note (Signed)
Pharmacy Patient Advocate Encounter  Insurance verification completed.    The patient is insured through [employer]. Currently a quantity of 15mL is a 30 day supply and the co-pay is $25.00 .  Ran test claim for Humalog. Currently a quantity of 10mL is a 30 day supply and the co-pay is $25.00 .  Ran test claim for CGM (Freestyle Libre 3). This requires a prior authorization but may result in a co-pay of $74.99 if approved   This test claim was processed through [facility]- copay amounts may vary at other pharmacies due to pharmacy/plan contracts, or as the patient moves through the different stages of their insurance plan.

## 2023-06-22 NOTE — Progress Notes (Signed)
Lower extremity venous duplex completed. Please see CV Procedures for preliminary results.  Shona Simpson, RVT 06/22/23 9:52 AM

## 2023-06-23 DIAGNOSIS — L03115 Cellulitis of right lower limb: Secondary | ICD-10-CM | POA: Diagnosis not present

## 2023-06-23 DIAGNOSIS — E1165 Type 2 diabetes mellitus with hyperglycemia: Secondary | ICD-10-CM | POA: Diagnosis not present

## 2023-06-23 LAB — CBC
HCT: 41.5 % (ref 36.0–46.0)
Hemoglobin: 13.1 g/dL (ref 12.0–15.0)
MCH: 29.4 pg (ref 26.0–34.0)
MCHC: 31.6 g/dL (ref 30.0–36.0)
MCV: 93.3 fL (ref 80.0–100.0)
Platelets: 315 10*3/uL (ref 150–400)
RBC: 4.45 MIL/uL (ref 3.87–5.11)
RDW: 12.4 % (ref 11.5–15.5)
WBC: 7.7 10*3/uL (ref 4.0–10.5)
nRBC: 0 % (ref 0.0–0.2)

## 2023-06-23 LAB — BASIC METABOLIC PANEL
Anion gap: 9 (ref 5–15)
BUN: 17 mg/dL (ref 6–20)
CO2: 25 mmol/L (ref 22–32)
Calcium: 9 mg/dL (ref 8.9–10.3)
Chloride: 103 mmol/L (ref 98–111)
Creatinine, Ser: 0.69 mg/dL (ref 0.44–1.00)
GFR, Estimated: >60 mL/min (ref >60)
Glucose, Bld: 376 mg/dL — ABNORMAL HIGH (ref 70–99)
Potassium: 3.7 mmol/L (ref 3.5–5.1)
Sodium: 137 mmol/L (ref 135–145)

## 2023-06-23 LAB — GLUCOSE, CAPILLARY
Glucose-Capillary: 309 mg/dL — ABNORMAL HIGH (ref 70–99)
Glucose-Capillary: 289 mg/dL — ABNORMAL HIGH (ref 70–99)
Glucose-Capillary: 323 mg/dL — ABNORMAL HIGH (ref 70–99)
Glucose-Capillary: 184 mg/dL — ABNORMAL HIGH (ref 70–99)

## 2023-06-23 LAB — HEMOGLOBIN A1C
Hgb A1c MFr Bld: >15.5 % — ABNORMAL HIGH (ref 4.8–5.6)
Mean Plasma Glucose: >398 mg/dL

## 2023-06-23 MED ORDER — INSULIN ASPART PROT & ASPART (70-30 MIX) 100 UNIT/ML ~~LOC~~ SUSP
20.0000 [IU] | Freq: Every day | SUBCUTANEOUS | Status: DC
  Administered 2023-06-23: 20 [IU] via SUBCUTANEOUS
  Filled 2023-06-23: qty 10

## 2023-06-23 MED ORDER — INSULIN ASPART PROT & ASPART (70-30 MIX) 100 UNIT/ML ~~LOC~~ SUSP
30.0000 [IU] | Freq: Every day | SUBCUTANEOUS | Status: DC
  Administered 2023-06-23 – 2023-06-24 (×2): 30 [IU] via SUBCUTANEOUS
  Filled 2023-06-23: qty 10

## 2023-06-23 MED ORDER — LIVING WELL WITH DIABETES BOOK
Freq: Once | Status: AC
  Filled 2023-06-23: qty 1

## 2023-06-23 NOTE — Progress Notes (Signed)
[facility] PROGRESS NOTE   Karen Roberts ZOX:[id number] DOB: [birth year] DOA: 06/21/2023  PCP: Patient, No Pcp Per  Brief History: 52 y.o. female with medical history significant for medication noncompliance, hepatic steatosis, insulin-dependent type 2 diabetes being admitted to the hospital with right lower extremity cellulitis.  She was also noted to have elevated glucose levels.  Due to lack of insurance she had not been able to afford any of her medications.  But now she mentions that she has Medicaid.    Consultants: None  Procedures: None    Subjective/Interval History: Patient mentions that the pain and swelling in the right leg has improved.  Denies any nausea vomiting.  No abdominal pain.    Assessment/Plan:  Cellulitis involving the right lower extremity Placed on IV Ancef.  Erythema seems to be improving.  Swelling has improved.  Continue antibiotics for another 24 hours and then transition to oral tomorrow. Noted to be afebrile.  Normal WBC noted this morning. Lower extremity Doppler studies negative for DVT.  Diabetes mellitus type 2, uncontrolled with hyperglycemia Has been noncompliant due to financial reasons.  Now she has Medicaid.  Started on 70/30 insulin which is being continued.  Will adjust dose.  Diabetes coordinator to see patient.  HbA1c is pending.  It was greater than 15 when it was last checked last year.  Hypokalemia Supplemented.  Hyponatremia Corrected.  Intertrigo Continue with topical agents.  Obesity Estimated body mass index is 33.02 kg/m as calculated from the following:   Height as of 09/23/22: 5\' 5"  (1.651 m).   Weight as of 09/23/22: 90 kg.   DVT Prophylaxis: Lovenox will be initiated Code Status: Full code Family Communication: Discussed with patient Disposition Plan: Hopefully discharge home when improved.  Patient does mention that she was evicted from her house recently.  Will request TOC to see her.  Status is:  Observation The patient will require care spanning > 2 midnights and should be moved to inpatient because: Continued need for IV antibiotics      Medications: Scheduled:  insulin aspart  0-15 Units Subcutaneous TID WC   insulin aspart  0-5 Units Subcutaneous QHS   insulin aspart protamine- aspart  20 Units Subcutaneous BID WC   nystatin   Topical BID   rivaroxaban  10 mg Oral Daily   Continuous:   ceFAZolin (ANCEF) IV 1 g (06/23/23 0526)   WJX:BJYNWGNFAOZHY **OR** acetaminophen, albuterol, LORazepam **OR** LORazepam, ondansetron **OR** ondansetron (ZOFRAN) IV, oxyCODONE, traZODone  Antibiotics: Anti-infectives (From admission, onward)    Start     Dose/Rate Route Frequency Ordered Stop   06/22/23 0815  ceFAZolin (ANCEF) IVPB 1 g/50 mL premix        1 g 100 mL/hr over 30 Minutes Intravenous Every 8 hours 06/22/23 0801 06/29/23 0559   06/22/23 0100  cefTRIAXone (ROCEPHIN) 2 g in sodium chloride 0.9 % 100 mL IVPB        2 g 200 mL/hr over 30 Minutes Intravenous  Once 06/22/23 0045 06/22/23 0350       Objective:  Vital Signs  Vitals:   06/22/23 1659 06/22/23 2021 06/23/23 0127 06/23/23 0507  BP: 118/80 126/74 118/73 124/79  Pulse: 91 (!) 101 88 86  Resp: 16 16 16 18   Temp: 98.1 F (36.7 C) 98.1 F (36.7 C) 98.1 F (36.7 C) 97.6 F (36.4 C)  TempSrc: Oral Oral Oral Oral  SpO2: 100% 95% 95% 98%    Intake/Output Summary (Last 24 hours) at 06/23/2023 8657 Last data filed  at 06/22/2023 1823 Gross per 24 hour  Intake 460 ml  Output --  Net 460 ml   There were no vitals filed for this visit.  General appearance: Awake alert.  In no distress Resp: Clear to auscultation bilaterally.  Normal effort Cardio: S1-S2 is normal regular.  No S3-S4.  No rubs murmurs or bruit GI: Abdomen is soft.  Nontender nondistended.  Bowel sounds are present normal.  No masses organomegaly Extremities: Swelling and mild erythema of the right lower extremity is noted anteriorly.  Warm to  touch.  Slightly tender to palpate.  No areas of fluctuation identified. Neurologic: Alert and oriented x3.  No focal neurological deficits.    Lab Results:  Data Reviewed: I have personally reviewed following labs and reports of the imaging studies  CBC: Recent Labs  Lab 06/21/23 1958 06/23/23 0615  WBC 7.3 7.7  NEUTROABS 4.6  --   HGB 13.0 13.1  HCT 38.8 41.5  MCV 88.8 93.3  PLT 318 315    Basic Metabolic Panel: Recent Labs  Lab 06/21/23 1958 06/23/23 0615  NA 128* 137  K 3.2* 3.7  CL 94* 103  CO2 24 25  GLUCOSE 564* 376*  BUN 16 17  CREATININE 0.67 0.69  CALCIUM 9.0 9.0    GFR: CrCl cannot be calculated (Unknown ideal weight.).  Liver Function Tests: Recent Labs  Lab 06/21/23 1958  AST 23  ALT 48*  ALKPHOS 201*  BILITOT 0.5  PROT 7.2  ALBUMIN 3.6     CBG: Recent Labs  Lab 06/22/23 1159 06/22/23 1703 06/22/23 2018 06/22/23 2244 06/23/23 0825  GLUCAP 394* 428* 185* 125* 309*     Radiology Studies: VAS [place] LOWER EXTREMITY VENOUS (DVT) (ONLY [facility])  Result Date: 06/22/2023  Lower Venous DVT Study Patient Name:  Karen Roberts  Date of Exam:   06/22/2023 Medical Rec #: [id number]       Accession #:    [id number] Date of Birth: 1970/12/29      Patient Gender: F Patient Age:   52 years Exam Location:  [facility] Procedure:      VAS [place] LOWER EXTREMITY VENOUS (DVT) Referring Phys: Dione Booze --------------------------------------------------------------------------------  Indications: Swelling, and Edema.  Risk Factors: Past pregnancy. Performing Technologist: Shona Simpson  Examination Guidelines: A complete evaluation includes B-mode imaging, spectral Doppler, color Doppler, and power Doppler as needed of all accessible portions of each vessel. Bilateral testing is considered an integral part of a complete examination. Limited examinations for reoccurring indications may be performed as noted. The reflux portion of the exam is performed with  the patient in reverse Trendelenburg.  +---------+---------------+---------+-----------+----------+--------------+ RIGHT    CompressibilityPhasicitySpontaneityPropertiesThrombus Aging +---------+---------------+---------+-----------+----------+--------------+ CFV      Full           Yes      Yes                                 +---------+---------------+---------+-----------+----------+--------------+ SFJ      Full                                                        +---------+---------------+---------+-----------+----------+--------------+ FV Prox  Full                                                        +---------+---------------+---------+-----------+----------+--------------+  FV Mid   Full                                                        +---------+---------------+---------+-----------+----------+--------------+ FV DistalFull                                                        +---------+---------------+---------+-----------+----------+--------------+ PFV      Full                                                        +---------+---------------+---------+-----------+----------+--------------+ POP      Full           Yes      Yes                                 +---------+---------------+---------+-----------+----------+--------------+ PTV      Full                                                        +---------+---------------+---------+-----------+----------+--------------+ PERO     Full                                                        +---------+---------------+---------+-----------+----------+--------------+   +---------+---------------+---------+-----------+----------+---------------+ LEFT     CompressibilityPhasicitySpontaneityPropertiesThrombus Aging  +---------+---------------+---------+-----------+----------+---------------+ CFV      Full           Yes      Yes                                   +---------+---------------+---------+-----------+----------+---------------+ SFJ      Full                                                         +---------+---------------+---------+-----------+----------+---------------+ FV Prox  Full                                                         +---------+---------------+---------+-----------+----------+---------------+ FV Mid   Full                                                         +---------+---------------+---------+-----------+----------+---------------+  FV Distal               Yes      Yes                  Patent by color +---------+---------------+---------+-----------+----------+---------------+ PFV      Full                                                         +---------+---------------+---------+-----------+----------+---------------+ POP      Full           Yes      Yes                                  +---------+---------------+---------+-----------+----------+---------------+ PTV      Full                                                         +---------+---------------+---------+-----------+----------+---------------+ PERO     Full                                                         +---------+---------------+---------+-----------+----------+---------------+     Summary: BILATERAL: - No evidence of deep vein thrombosis seen in the lower extremities, bilaterally. -No evidence of popliteal cyst, bilaterally.   *See table(s) above for measurements and observations. Electronically signed by Carolynn Sayers on 06/22/2023 at 2:38:30 PM.    Final        LOS: 0 days   Osvaldo Shipper  [facility] Pager on [email or web address]  06/23/2023, 9:07 AM

## 2023-06-23 NOTE — Plan of Care (Signed)
  Problem: Education: Goal: Ability to describe self-care measures that may prevent or decrease complications (Diabetes Survival Skills Education) will improve Outcome: Progressing   Problem: Coping: Goal: Ability to adjust to condition or change in health will improve Outcome: Progressing   Problem: Health Behavior/Discharge Planning: Goal: Ability to identify and utilize available resources and services will improve Outcome: Progressing   Problem: Nutritional: Goal: Maintenance of adequate nutrition will improve Outcome: Progressing   Problem: Skin Integrity: Goal: Risk for impaired skin integrity will decrease Outcome: Progressing   Problem: Tissue Perfusion: Goal: Adequacy of tissue perfusion will improve Outcome: Progressing   Problem: Clinical Measurements: Goal: Ability to maintain clinical measurements within normal limits will improve Outcome: Progressing   Problem: Activity: Goal: Risk for activity intolerance will decrease Outcome: Progressing   Problem: Nutrition: Goal: Adequate nutrition will be maintained Outcome: Progressing   Problem: Elimination: Goal: Will not experience complications related to bowel motility Outcome: Progressing   Problem: Pain Management: Goal: General experience of comfort will improve Outcome: Progressing   Problem: Safety: Goal: Ability to remain free from injury will improve Outcome: Progressing   Problem: Skin Integrity: Goal: Risk for impaired skin integrity will decrease Outcome: Progressing

## 2023-06-23 NOTE — Inpatient Diabetes Management (Signed)
Inpatient Diabetes Program Recommendations  AACE/ADA: New Consensus Statement on Inpatient Glycemic Control (2015)  Target Ranges:  Prepandial:   less than 140 mg/dL      Peak postprandial:   less than 180 mg/dL (1-2 hours)      Critically ill patients:  140 - 180 mg/dL   Lab Results  Component Value Date   GLUCAP 289 (H) 06/23/2023   HGBA1C >15.5 (H) 06/22/2023    Review of Glycemic Control  Latest Reference Range & Units 06/22/23 17:03 06/22/23 20:18 06/22/23 22:44 06/23/23 08:25 06/23/23 11:24  Glucose-Capillary 70 - 99 mg/dL 161 (H) 096 (H) 045 (H) 309 (H) 289 (H)   Diabetes history: DM  Outpatient Diabetes medications:  None but she has been on insulin in the past Current orders for Inpatient glycemic control:  Novolog 0-15 units tid with meals and HS Novolog 70/30 30 units q AM and Novolog 70/30 20 units with supper  Inpatient Diabetes Program Recommendations:    Spoke with patient regarding home DM management and current A1C of >15.5%.  She had lost insurance and was unable to get insulin and DM medications.  We reviewed importance of taking insulin and restarting doses.  She has meter at home but no insulin.  She recently moved and states she is in process of getting a new MD. She will need prescriptions for insulins at discharge.  She was on 70/30 insulin before and states that blood sugars were much better when she took her insulin.  Will order LWWD booklet for patient.  Discussed importance of glycemic control and taking insulin.  Patient verbalized understanding.    Thanks,  Beryl Meager, RN, BC-ADM Inpatient Diabetes Coordinator Pager [phone]  (8a-5p)

## 2023-06-24 DIAGNOSIS — L03115 Cellulitis of right lower limb: Secondary | ICD-10-CM | POA: Diagnosis not present

## 2023-06-24 LAB — GLUCOSE, CAPILLARY: Glucose-Capillary: 315 mg/dL — ABNORMAL HIGH (ref 70–99)

## 2023-06-24 MED ORDER — NYSTATIN 100000 UNIT/GM EX POWD: Freq: Two times a day (BID) | CUTANEOUS | 0 refills | Status: AC

## 2023-06-24 MED ORDER — PEN NEEDLES 31G X 5 MM MISC: 1.0000 | Freq: Three times a day (TID) | 2 refills | Status: AC

## 2023-06-24 MED ORDER — CEFADROXIL 500 MG PO CAPS: 1000.0000 mg | ORAL_CAPSULE | Freq: Two times a day (BID) | ORAL | 0 refills | Status: AC

## 2023-06-24 MED ORDER — CEFADROXIL 500 MG PO CAPS
1000.0000 mg | ORAL_CAPSULE | Freq: Two times a day (BID) | ORAL | Status: DC
  Administered 2023-06-24: 1000 mg via ORAL
  Filled 2023-06-24: qty 2

## 2023-06-24 MED ORDER — INSULIN ASPART PROT & ASPART (70-30 MIX) 100 UNIT/ML ~~LOC~~ SUSP: 35.0000 [IU] | Freq: Every day | SUBCUTANEOUS | Status: DC

## 2023-06-24 MED ORDER — NOVOLIN 70/30 FLEXPEN RELION (70-30) 100 UNIT/ML ~~LOC~~ SUPN: PEN_INJECTOR | SUBCUTANEOUS | 2 refills | Status: AC

## 2023-06-24 NOTE — Plan of Care (Signed)
  Problem: Education: Goal: Ability to describe self-care measures that may prevent or decrease complications (Diabetes Survival Skills Education) will improve Outcome: Progressing Goal: Individualized Educational Video(s) Outcome: Progressing   Problem: Coping: Goal: Ability to adjust to condition or change in health will improve Outcome: Progressing   Problem: Fluid Volume: Goal: Ability to maintain a balanced intake and output will improve Outcome: Progressing   Problem: Health Behavior/Discharge Planning: Goal: Ability to identify and utilize available resources and services will improve Outcome: Progressing Goal: Ability to manage health-related needs will improve Outcome: Progressing   Problem: Metabolic: Goal: Ability to maintain appropriate glucose levels will improve Outcome: Progressing   Problem: Nutritional: Goal: Maintenance of adequate nutrition will improve Outcome: Progressing Goal: Progress toward achieving an optimal weight will improve Outcome: Progressing   Problem: Skin Integrity: Goal: Risk for impaired skin integrity will decrease Outcome: Progressing   Problem: Tissue Perfusion: Goal: Adequacy of tissue perfusion will improve Outcome: Progressing   Problem: Education: Goal: Knowledge of General Education information will improve Description: Including pain rating scale, medication(s)/side effects and non-pharmacologic comfort measures Outcome: Progressing   Problem: Health Behavior/Discharge Planning: Goal: Ability to manage health-related needs will improve Outcome: Progressing   Problem: Clinical Measurements: Goal: Ability to maintain clinical measurements within normal limits will improve Outcome: Progressing Goal: Will remain free from infection Outcome: Progressing Goal: Diagnostic test results will improve Outcome: Progressing Goal: Respiratory complications will improve Outcome: Progressing Goal: Cardiovascular complication will  be avoided Outcome: Progressing   Problem: Activity: Goal: Risk for activity intolerance will decrease Outcome: Progressing   Problem: Coping: Goal: Level of anxiety will decrease Outcome: Progressing   Problem: Elimination: Goal: Will not experience complications related to bowel motility Outcome: Progressing Goal: Will not experience complications related to urinary retention Outcome: Progressing   Problem: Pain Management: Goal: General experience of comfort will improve Outcome: Progressing   Problem: Safety: Goal: Ability to remain free from injury will improve Outcome: Progressing   Problem: Skin Integrity: Goal: Risk for impaired skin integrity will decrease Outcome: Progressing

## 2023-06-24 NOTE — TOC Transition Note (Signed)
Transition of Care [facility] - CM/SW Discharge Note   Patient Details  Name: Syrita Radell MRN: [id number] Date of Birth: Jun 02, 1971  [facility]) CM/SW Contact:  Darleene Cleaver, LCSW Phone Number: 06/24/2023, 2:24 PM   Clinical Narrative:     CSW received consult for patient needing homeless resources.  CSW provided information on AVS.  Patient was also provided information for [facility] Department of Social Services for her to contact them to see if they can assist with housing.  TOC signing off.  Final next level of care: Other (comment) Barriers to Discharge: Barriers Resolved   Patient Goals and CMS Choice      Discharge Placement    With family and friend.                     Discharge Plan and Services Additional resources added to the After Visit Summary for                                       Social Determinants of Health (SDOH) Interventions SDOH Screenings   Food Insecurity: Food Insecurity Present (06/22/2023)  Housing: Medium Risk (06/22/2023)  Transportation Needs: No Transportation Needs (06/22/2023)  Utilities: At Risk (06/22/2023)  Tobacco Use: Low Risk  (06/21/2023)     Readmission Risk Interventions    11/13/2020   11:57 AM  Readmission Risk Prevention Plan  Post Dischage Appt Complete  Medication Screening Complete  Transportation Screening Complete

## 2023-06-24 NOTE — Discharge Summary (Signed)
[facility]  Physician Discharge Summary   Patient ID: Karen Roberts MRN: [id number] DOB/AGE: 1971/05/31 52 y.o.  Admit date: 06/21/2023 Discharge date: 06/24/2023    PCP: Patient, No Pcp Per  DISCHARGE DIAGNOSES:  Right lower extremity cellulitis   RECOMMENDATIONS FOR OUTPATIENT FOLLOW UP: Patient instructed to follow-up with her primary care provider   Home Health: None Equipment/Devices: None  CODE STATUS: Full code  DISCHARGE CONDITION: fair  Diet recommendation: Modified carbohydrate  INITIAL HISTORY: 51 y.o. female with medical history significant for medication noncompliance, hepatic steatosis, insulin-dependent type 2 diabetes being admitted to the hospital with right lower extremity cellulitis.  She was also noted to have elevated glucose levels.  Due to lack of insurance she had not been able to afford any of her medications.  But now she mentions that she has Medicaid.    HOSPITAL COURSE:   Cellulitis involving the right lower extremity Placed on IV Ancef.  Erythema seems to be improving.  Swelling has improved.  Changed over to oral antibiotics today.  WBC was normal.  Lower extremity Dopplers negative for DVT.  Feels well.  Okay for discharge home today.    Diabetes mellitus type 2, uncontrolled with hyperglycemia Has been noncompliant due to financial reasons.  Now she has Medicaid.  Started on 70/30 insulin which is being continued.  HbA1c greater than 15.5.   Hypokalemia Supplemented.   Hyponatremia Corrected.   Intertrigo Continue with topical agents.   Obesity Estimated body mass index is 33.02 kg/m as calculated from the following:   Height as of 09/23/22: 5\' 5"  (1.651 m).   Weight as of 09/23/22: 90 kg.   Patient is stable.  Okay for discharge home today.   PERTINENT LABS:  The results of significant diagnostics from this hospitalization (including imaging, microbiology, ancillary and laboratory) are listed below for reference.     Labs:   Basic Metabolic Panel: Recent Labs  Lab 06/21/23 1958 06/23/23 0615  NA 128* 137  K 3.2* 3.7  CL 94* 103  CO2 24 25  GLUCOSE 564* 376*  BUN 16 17  CREATININE 0.67 0.69  CALCIUM 9.0 9.0   Liver Function Tests: Recent Labs  Lab 06/21/23 1958  AST 23  ALT 48*  ALKPHOS 201*  BILITOT 0.5  PROT 7.2  ALBUMIN 3.6    CBC: Recent Labs  Lab 06/21/23 1958 06/23/23 0615  WBC 7.3 7.7  NEUTROABS 4.6  --   HGB 13.0 13.1  HCT 38.8 41.5  MCV 88.8 93.3  PLT 318 315     CBG: Recent Labs  Lab 06/23/23 0825 06/23/23 1124 06/23/23 1631 06/23/23 2134 06/24/23 0740  GLUCAP 309* 289* 323* 184* 315*     IMAGING STUDIES VAS [place] LOWER EXTREMITY VENOUS (DVT) (ONLY [facility] & [facility])  Result Date: 06/22/2023  Lower Venous DVT Study Patient Name:  Karen Roberts  Date of Exam:   06/22/2023 Medical Rec #: [id number]       Accession #:    [id number] Date of Birth: Dec 03, 1970      Patient Gender: F Patient Age:   61 years Exam Location:  [facility] Procedure:      VAS [place] LOWER EXTREMITY VENOUS (DVT) Referring Phys: Dione Booze --------------------------------------------------------------------------------  Indications: Swelling, and Edema.  Risk Factors: Past pregnancy. Performing Technologist: Shona Simpson  Examination Guidelines: A complete evaluation includes B-mode imaging, spectral Doppler, color Doppler, and power Doppler as needed of all accessible portions of each vessel. Bilateral testing is considered an integral part of  a complete examination. Limited examinations for reoccurring indications may be performed as noted. The reflux portion of the exam is performed with the patient in reverse Trendelenburg.  +---------+---------------+---------+-----------+----------+--------------+ RIGHT    CompressibilityPhasicitySpontaneityPropertiesThrombus Aging +---------+---------------+---------+-----------+----------+--------------+ CFV      Full           Yes      Yes                                  +---------+---------------+---------+-----------+----------+--------------+ SFJ      Full                                                        +---------+---------------+---------+-----------+----------+--------------+ FV Prox  Full                                                        +---------+---------------+---------+-----------+----------+--------------+ FV Mid   Full                                                        +---------+---------------+---------+-----------+----------+--------------+ FV DistalFull                                                        +---------+---------------+---------+-----------+----------+--------------+ PFV      Full                                                        +---------+---------------+---------+-----------+----------+--------------+ POP      Full           Yes      Yes                                 +---------+---------------+---------+-----------+----------+--------------+ PTV      Full                                                        +---------+---------------+---------+-----------+----------+--------------+ PERO     Full                                                        +---------+---------------+---------+-----------+----------+--------------+   +---------+---------------+---------+-----------+----------+---------------+ LEFT     CompressibilityPhasicitySpontaneityPropertiesThrombus Aging  +---------+---------------+---------+-----------+----------+---------------+ CFV      Full  Yes      Yes                                  +---------+---------------+---------+-----------+----------+---------------+ SFJ      Full                                                         +---------+---------------+---------+-----------+----------+---------------+ FV Prox  Full                                                          +---------+---------------+---------+-----------+----------+---------------+ FV Mid   Full                                                         +---------+---------------+---------+-----------+----------+---------------+ FV Distal               Yes      Yes                  Patent by color +---------+---------------+---------+-----------+----------+---------------+ PFV      Full                                                         +---------+---------------+---------+-----------+----------+---------------+ POP      Full           Yes      Yes                                  +---------+---------------+---------+-----------+----------+---------------+ PTV      Full                                                         +---------+---------------+---------+-----------+----------+---------------+ PERO     Full                                                         +---------+---------------+---------+-----------+----------+---------------+     Summary: BILATERAL: - No evidence of deep vein thrombosis seen in the lower extremities, bilaterally. -No evidence of popliteal cyst, bilaterally.   *See table(s) above for measurements and observations. Electronically signed by Carolynn Sayers on 06/22/2023 at 2:38:30 PM.    Final     DISCHARGE EXAMINATION: Vitals:   06/23/23 0507 06/23/23 1410 06/23/23 2135 06/24/23 0608  BP: 124/79 127/85 (!) 133/90 107/71  Pulse: 86 100  89  Resp: 18 16 16 20   Temp: 97.6 F (36.4 C) (!) 97.5 F (36.4 C) 98.4 F (36.9 C) 97.8 F (36.6 C)  TempSrc: Oral Oral Oral Oral  SpO2: 98% 97% 97% 98%   General appearance: Awake alert.  In no distress Resp: Clear to auscultation bilaterally.  Normal effort Cardio: S1-S2 is normal regular.  No S3-S4.  No rubs murmurs or bruit GI: Abdomen is soft.  Nontender nondistended.  Bowel sounds are present normal.  No masses organomegaly Extremities: Swelling and erythema of the right lower  extremity   DISPOSITION: Self-care  Discharge Instructions     Call MD for:  difficulty breathing, headache or visual disturbances   Complete by: As directed    Call MD for:  extreme fatigue   Complete by: As directed    Call MD for:  persistant dizziness or light-headedness   Complete by: As directed    Call MD for:  persistant nausea and vomiting   Complete by: As directed    Call MD for:  severe uncontrolled pain   Complete by: As directed    Call MD for:  temperature >100.4   Complete by: As directed    Diet Carb Modified   Complete by: As directed    Discharge instructions   Complete by: As directed    Please be sure to follow-up with your primary care provider for further management of your diabetes.  Take your medications as prescribed.  You were cared for by a hospitalist during your hospital stay. If you have any questions about your discharge medications or the care you received while you were in the hospital after you are discharged, you can call the unit and asked to speak with the hospitalist on call if the hospitalist that took care of you is not available. Once you are discharged, your primary care physician will handle any further medical issues. Please note that NO REFILLS for any discharge medications will be authorized once you are discharged, as it is imperative that you return to your primary care physician (or establish a relationship with a primary care physician if you do not have one) for your aftercare needs so that they can reassess your need for medications and monitor your lab values. If you do not have a primary care physician, you can call [phone] for a physician referral.   Increase activity slowly   Complete by: As directed          Allergies as of 06/24/2023       Reactions   Ciprofloxacin Dermatitis   "dr's think it may have caused mastitis"   Morphine And Codeine Nausea Only   Nausea and headache   Pork-derived Products Shortness Of  Breath, Nausea And Vomiting   Morphine Other (See Comments)   Headaches, itching, anger        Medication List     STOP taking these medications    acetaminophen 500 MG tablet Commonly known as: TYLENOL   methocarbamol 500 MG tablet Commonly known as: ROBAXIN   oxyCODONE 5 MG immediate release tablet Commonly known as: Oxy IR/ROXICODONE       TAKE these medications    Accu-Chek Guide test strip Generic drug: glucose blood Use as instructed up to 4 times daily   Accu-Chek Guide test strip Generic drug: glucose blood USe as directed up to 4 times daily   Accu-Chek Guide w/Device Kit Use as directed   Accu-Chek Guide w/Device Kit Dispense based on patient and insurance preference.  Can dispense strips and lancets only if patient has a glucometer at home. Check CBGs 4 times daily before each meal and before bedtime   Accu-Chek Softclix Lancets lancets Use as directed up to 4 times daily   cefadroxil 500 MG capsule Commonly known as: DURICEF Take 2 capsules (1,000 mg total) by mouth 2 (two) times daily for 5 days.   ibuprofen 200 MG tablet Commonly known as: ADVIL Take 200 mg by mouth every 6 (six) hours as needed for moderate pain (pain score 4-6).   NovoLIN 70/30 Kwikpen (70-30) 100 UNIT/ML KwikPen Generic drug: insulin isophane & regular human KwikPen Take 35 units at 8 AM and 20 units at 6 PM daily What changed:  how much to take how to take this when to take this additional instructions Another medication with the same name was removed. Continue taking this medication, and follow the directions you see here.   nystatin powder Commonly known as: MYCOSTATIN/NYSTOP Apply topically 2 (two) times daily. Apply to rash   Pen Needles 31G X 5 MM Misc 1 each by Does not apply route 3 (three) times daily. May dispense any manufacturer covered by patient's insurance. What changed:  medication strength when to take this additional instructions Another  medication with the same name was removed. Continue taking this medication, and follow the directions you see here.          Follow-up Information     [facility], [facility] Department Of Public Follow up.   Specialty: Home Health Services Why: Call to discuss low income housing. Contact information: [address] [place] [address] [phone]                 TOTAL DISCHARGE TIME: 35 minutes  Khamiya Varin Rito Ehrlich  [facility] Pager on [email or web address]  06/25/2023, 10:39 AM

## 2023-07-07 ENCOUNTER — Other Ambulatory Visit: Payer: Self-pay | Admitting: Family Medicine

## 2023-07-07 DIAGNOSIS — T148XXA Other injury of unspecified body region, initial encounter: Secondary | ICD-10-CM

## 2023-08-18 ENCOUNTER — Other Ambulatory Visit: Payer: Self-pay | Admitting: Family Medicine

## 2023-08-18 DIAGNOSIS — T148XXA Other injury of unspecified body region, initial encounter: Secondary | ICD-10-CM

## 2023-09-29 ENCOUNTER — Encounter: Payer: Self-pay | Admitting: Family Medicine

## 2023-10-02 ENCOUNTER — Other Ambulatory Visit: Payer: Commercial Managed Care - HMO

## 2023-10-25 ENCOUNTER — Other Ambulatory Visit

## 2023-11-01 ENCOUNTER — Ambulatory Visit
Admission: RE | Admit: 2023-11-01 | Discharge: 2023-11-01 | Disposition: A | Discharge status: Home / Self Care | Source: Ambulatory Visit | Attending: Family Medicine | Admitting: Family Medicine

## 2023-11-01 DIAGNOSIS — T148XXA Other injury of unspecified body region, initial encounter: Secondary | ICD-10-CM

## 2024-07-12 ENCOUNTER — Emergency Department (HOSPITAL_COMMUNITY)
Admission: EM | Admit: 2024-07-12 | Discharge: 2024-07-13 | Disposition: A | Attending: Emergency Medicine | Admitting: Emergency Medicine

## 2024-07-12 ENCOUNTER — Other Ambulatory Visit: Payer: Self-pay

## 2024-07-12 ENCOUNTER — Encounter (HOSPITAL_COMMUNITY): Payer: Self-pay

## 2024-07-12 DIAGNOSIS — M79661 Pain in right lower leg: Secondary | ICD-10-CM | POA: Insufficient documentation

## 2024-07-12 DIAGNOSIS — M79604 Pain in right leg: Secondary | ICD-10-CM

## 2024-07-12 DIAGNOSIS — L0390 Cellulitis, unspecified: Secondary | ICD-10-CM

## 2024-07-12 DIAGNOSIS — Z794 Long term (current) use of insulin: Secondary | ICD-10-CM | POA: Insufficient documentation

## 2024-07-12 DIAGNOSIS — E119 Type 2 diabetes mellitus without complications: Secondary | ICD-10-CM | POA: Insufficient documentation

## 2024-07-12 LAB — CBC WITH DIFFERENTIAL/PLATELET
Abs Immature Granulocytes: 0.03 K/uL (ref 0.00–0.07)
Basophils Absolute: 0 K/uL (ref 0.0–0.1)
Basophils Relative: 0 %
Eosinophils Absolute: 0.2 K/uL (ref 0.0–0.5)
Eosinophils Relative: 2 %
HCT: 38.1 % (ref 36.0–46.0)
Hemoglobin: 12.9 g/dL (ref 12.0–15.0)
Immature Granulocytes: 0 %
Lymphocytes Relative: 20 %
Lymphs Abs: 1.8 K/uL (ref 0.7–4.0)
MCH: 29.3 pg (ref 26.0–34.0)
MCHC: 33.9 g/dL (ref 30.0–36.0)
MCV: 86.4 fL (ref 80.0–100.0)
Monocytes Absolute: 0.9 K/uL (ref 0.1–1.0)
Monocytes Relative: 10 %
Neutro Abs: 6 K/uL (ref 1.7–7.7)
Neutrophils Relative %: 68 %
Platelets: 388 K/uL (ref 150–400)
RBC: 4.41 MIL/uL (ref 3.87–5.11)
RDW: 12 % (ref 11.5–15.5)
WBC: 8.9 K/uL (ref 4.0–10.5)
nRBC: 0 % (ref 0.0–0.2)

## 2024-07-12 LAB — COMPREHENSIVE METABOLIC PANEL WITH GFR
ALT: 22 U/L (ref 0–44)
AST: 20 U/L (ref 15–41)
Albumin: 4 g/dL (ref 3.5–5.0)
Alkaline Phosphatase: 167 U/L — ABNORMAL HIGH (ref 38–126)
Anion gap: 10 (ref 5–15)
BUN: 11 mg/dL (ref 6–20)
CO2: 28 mmol/L (ref 22–32)
Calcium: 9.4 mg/dL (ref 8.9–10.3)
Chloride: 94 mmol/L — ABNORMAL LOW (ref 98–111)
Creatinine, Ser: 1.02 mg/dL — ABNORMAL HIGH (ref 0.44–1.00)
GFR, Estimated: >60 mL/min (ref >60)
Glucose, Bld: 637 mg/dL (ref 70–99)
Potassium: 4.1 mmol/L (ref 3.5–5.1)
Sodium: 133 mmol/L — ABNORMAL LOW (ref 135–145)
Total Bilirubin: 0.4 mg/dL (ref 0.0–1.2)
Total Protein: 7 g/dL (ref 6.5–8.1)

## 2024-07-12 LAB — I-STAT CG4 LACTIC ACID, ED
Lactic Acid, Venous: 2.5 mmol/L (ref 0.5–1.9)
Lactic Acid, Venous: 1.8 mmol/L (ref 0.5–1.9)

## 2024-07-12 LAB — CBG MONITORING, ED
Glucose-Capillary: 520 mg/dL (ref 70–99)
Glucose-Capillary: 389 mg/dL — ABNORMAL HIGH (ref 70–99)

## 2024-07-12 MED ORDER — INSULIN ASPART 100 UNIT/ML IJ SOLN
12.0000 [IU] | Freq: Once | INTRAMUSCULAR | Status: AC
  Administered 2024-07-12: 12 [IU] via SUBCUTANEOUS
  Filled 2024-07-12: qty 12

## 2024-07-12 MED ORDER — CEFAZOLIN SODIUM-DEXTROSE 2-4 GM/100ML-% IV SOLN
2.0000 g | Freq: Once | INTRAVENOUS | Status: AC
  Administered 2024-07-12: 2 g via INTRAVENOUS
  Filled 2024-07-12: qty 100

## 2024-07-12 MED ORDER — OXYCODONE-ACETAMINOPHEN 5-325 MG PO TABS: 1.0000 | ORAL_TABLET | Freq: Four times a day (QID) | ORAL | 0 refills | Status: AC | PRN

## 2024-07-12 MED ORDER — LACTATED RINGERS IV BOLUS
1000.0000 mL | Freq: Once | INTRAVENOUS | Status: AC
  Administered 2024-07-12: 1000 mL via INTRAVENOUS

## 2024-07-12 MED ORDER — OXYCODONE-ACETAMINOPHEN 5-325 MG PO TABS: 1.0000 | ORAL_TABLET | Freq: Once | ORAL | Status: DC

## 2024-07-12 MED ORDER — HYDROMORPHONE HCL 1 MG/ML IJ SOLN
0.5000 mg | Freq: Once | INTRAMUSCULAR | Status: AC
  Administered 2024-07-13: 0.5 mg via INTRAVENOUS
  Filled 2024-07-12: qty 1

## 2024-07-12 MED ORDER — HYDROMORPHONE HCL 1 MG/ML IJ SOLN
0.5000 mg | Freq: Once | INTRAMUSCULAR | Status: AC
  Administered 2024-07-12: 0.5 mg via INTRAVENOUS
  Filled 2024-07-12: qty 1

## 2024-07-12 MED ORDER — APIXABAN 5 MG PO TABS
10.0000 mg | ORAL_TABLET | Freq: Two times a day (BID) | ORAL | Status: DC
  Administered 2024-07-13: 10 mg via ORAL
  Filled 2024-07-12: qty 2

## 2024-07-12 MED ORDER — CEPHALEXIN 500 MG PO CAPS: 500.0000 mg | ORAL_CAPSULE | Freq: Four times a day (QID) | ORAL | 0 refills | Status: AC

## 2024-07-12 NOTE — Discharge Instructions (Signed)
 Return here tomorrow morning for your ultrasound

## 2024-07-12 NOTE — ED Triage Notes (Signed)
 Pt reports with right leg swelling and pain (lower leg) for over a week. Pt reports being a diabetic and not taking her medications.

## 2024-07-12 NOTE — ED Notes (Signed)
 Blood sugar 512. MD and RN aware

## 2024-07-12 NOTE — ED Provider Notes (Addendum)
 [place] EMERGENCY DEPARTMENT AT [facility] Provider Note   CSN: [id number] Arrival date & time: 07/12/24  1850     Patient presents with: Leg Pain   Karen Roberts is a 53 y.o. female.   53 year old female presents with pain and swelling to her right lower leg for about a week.  Does have a history of type 2 diabetes but has not been on medication for this.  Denies any chest pain or shortness of breath.  Has had some myalgias but no reportable fever.  No prior history of DVT.       Prior to Admission medications  Medication Sig Start Date End Date Taking? Authorizing Provider  Accu-Chek Softclix Lancets lancets Use as directed up to 4 times daily 09/23/22   Armenta Canning, MD  blood glucose meter kit and supplies KIT Dispense based on patient and insurance preference. Can dispense strips and lancets only if patient has a glucometer at home. Check CBGs 4 times daily before each meal and before bedtime 09/23/22   Armenta Canning, MD  Blood Glucose Monitoring Suppl (ACCU-CHEK GUIDE) w/Device KIT Use as directed 09/06/21   Tammy Sor, PA-C  glucose blood (ACCU-CHEK GUIDE) test strip Use as instructed up to 4 times daily 09/06/21   Tammy Sor, PA-C  glucose blood test strip USe as directed up to 4 times daily 09/22/22   Armenta Canning, MD  ibuprofen  (ADVIL ) 200 MG tablet Take 200 mg by mouth every 6 (six) hours as needed for moderate pain (pain score 4-6).    [provider]  insulin  isophane & regular human KwikPen (NOVOLIN  70/30 KWIKPEN) (70-30) 100 UNIT/ML KwikPen Take 35 units at 8 AM and 20 units at 6 PM daily 06/24/23   Verdene Purchase, MD  Insulin  Pen Needle (PEN NEEDLES) 31G X 5 MM MISC 1 each by Does not apply route 3 (three) times daily. May dispense any manufacturer covered by patient's insurance. 06/24/23   Krishnan, Gokul, MD  nystatin  (MYCOSTATIN /NYSTOP ) powder Apply topically 2 (two) times daily. Apply to rash 06/24/23   Krishnan, Gokul, MD     Allergies: Ciprofloxacin, Morphine  and codeine, Porcine (pork) protein-containing drug products, and Morphine     Review of Systems  All other systems reviewed and are negative.   Updated Vital Signs BP (!) 145/98 (BP Location: Right Arm)   Pulse (!) 120   Temp 98.7 F (37.1 C) (Oral)   Resp 18   LMP 11/29/2016   SpO2 98%   Physical Exam Vitals and nursing note reviewed.  Constitutional:      General: She is not in acute distress.    Appearance: Normal appearance. She is well-developed. She is not toxic-appearing.  HENT:     Head: Normocephalic and atraumatic.  Eyes:     General: Lids are normal.     Conjunctiva/sclera: Conjunctivae normal.     Pupils: Pupils are equal, round, and reactive to light.  Neck:     Thyroid: No thyroid mass.     Trachea: No tracheal deviation.  Cardiovascular:     Rate and Rhythm: Normal rate and regular rhythm.     Heart sounds: Normal heart sounds. No murmur heard.    No gallop.  Pulmonary:     Effort: Pulmonary effort is normal. No respiratory distress.     Breath sounds: Normal breath sounds. No stridor. No decreased breath sounds, wheezing, rhonchi or rales.  Abdominal:     General: There is no distension.     Palpations: Abdomen is  soft.     Tenderness: There is no abdominal tenderness. There is no rebound.  Musculoskeletal:        General: No tenderness. Normal range of motion.     Cervical back: Normal range of motion and neck supple.       Legs:  Skin:    General: Skin is warm and dry.     Findings: No abrasion or rash.  Neurological:     Mental Status: She is alert and oriented to person, place, and time. Mental status is at baseline.     GCS: GCS eye subscore is 4. GCS verbal subscore is 5. GCS motor subscore is 6.     Cranial Nerves: No cranial nerve deficit.     Sensory: No sensory deficit.     Motor: Motor function is intact.  Psychiatric:        Attention and Perception: Attention normal.        Speech: Speech  normal.        Behavior: Behavior normal.     (all labs ordered are listed, but only abnormal results are displayed) Labs Reviewed  COMPREHENSIVE METABOLIC PANEL WITH GFR - Abnormal; Notable for the following components:      Result Value   Sodium 133 (*)    Chloride 94 (*)    Glucose, Bld 637 (*)    Creatinine, Ser 1.02 (*)    Alkaline Phosphatase 167 (*)    All other components within normal limits  CBG MONITORING, ED - Abnormal; Notable for the following components:   Glucose-Capillary 520 (*)    All other components within normal limits  I-STAT CG4 LACTIC ACID, ED - Abnormal; Notable for the following components:   Lactic Acid, Venous 2.5 (*)    All other components within normal limits  CBC WITH DIFFERENTIAL/PLATELET    EKG: None  Radiology: No results found.   Procedures   Medications Ordered in the ED  lactated ringers  infusion (has no administration in time range)  lactated ringers  bolus 1,000 mL (has no administration in time range)  HYDROmorphone  (DILAUDID ) injection 0.5 mg (has no administration in time range)  insulin  aspart (novoLOG ) injection 12 Units (has no administration in time range)  ceFAZolin  (ANCEF ) IVPB 2g/100 mL premix (has no administration in time range)                                    Medical Decision Making Amount and/or Complexity of Data Reviewed Labs: ordered.  Risk Prescription drug management.   Patient given IV fluids and insulin  here.  Blood sugar has improved.  She has no increase in her anion gap.  She has no leukocytosis on her CBC.  Given dose of IV Rocephin .  Treated for pain here.  Suspect patient has cellulitis but cannot rule out DVT.  Will give patient dose of Eliquis due to patient have allergy to pork derived products and patient to return tomorrow for DVT morning.  Patient has a prescription for insulin  and will need to get it refilled.  Patient comfortable with this plan     Final diagnoses:  None    ED  Discharge Orders     None          Dasie Faden, MD 07/12/24 2332    Dasie Faden, MD 07/12/24 7661    Dasie Faden, MD 07/12/24 [phone]

## 2024-07-13 ENCOUNTER — Ambulatory Visit (HOSPITAL_COMMUNITY)
Admission: RE | Admit: 2024-07-13 | Discharge: 2024-07-13 | Disposition: A | Discharge status: Home / Self Care | Source: Ambulatory Visit | Attending: Emergency Medicine | Admitting: Emergency Medicine

## 2024-07-13 DIAGNOSIS — M7989 Other specified soft tissue disorders: Secondary | ICD-10-CM

## 2024-07-13 DIAGNOSIS — R52 Pain, unspecified: Secondary | ICD-10-CM
